# Patient Record
Sex: Female | Born: 1935 | Race: White | Hispanic: No | Marital: Married | Smoking: Former smoker
Health system: Southern US, Community
[De-identification: ages and names within clinical notes are randomized; demographics above are authoritative.]

## PROBLEM LIST (undated history)

## (undated) DIAGNOSIS — A409 Streptococcal sepsis, unspecified: Secondary | ICD-10-CM

## (undated) DIAGNOSIS — M1990 Unspecified osteoarthritis, unspecified site: Secondary | ICD-10-CM

## (undated) DIAGNOSIS — Z8719 Personal history of other diseases of the digestive system: Secondary | ICD-10-CM

## (undated) DIAGNOSIS — M549 Dorsalgia, unspecified: Secondary | ICD-10-CM

## (undated) DIAGNOSIS — R600 Localized edema: Secondary | ICD-10-CM

## (undated) DIAGNOSIS — I10 Essential (primary) hypertension: Secondary | ICD-10-CM

## (undated) DIAGNOSIS — K219 Gastro-esophageal reflux disease without esophagitis: Secondary | ICD-10-CM

## (undated) DIAGNOSIS — E119 Type 2 diabetes mellitus without complications: Secondary | ICD-10-CM

## (undated) DIAGNOSIS — I701 Atherosclerosis of renal artery: Secondary | ICD-10-CM

## (undated) DIAGNOSIS — I4819 Other persistent atrial fibrillation: Secondary | ICD-10-CM

## (undated) DIAGNOSIS — R112 Nausea with vomiting, unspecified: Secondary | ICD-10-CM

## (undated) DIAGNOSIS — G8929 Other chronic pain: Secondary | ICD-10-CM

## (undated) DIAGNOSIS — I739 Peripheral vascular disease, unspecified: Secondary | ICD-10-CM

## (undated) DIAGNOSIS — K5790 Diverticulosis of intestine, part unspecified, without perforation or abscess without bleeding: Secondary | ICD-10-CM

## (undated) DIAGNOSIS — Z9889 Other specified postprocedural states: Secondary | ICD-10-CM

## (undated) DIAGNOSIS — I272 Pulmonary hypertension, unspecified: Secondary | ICD-10-CM

## (undated) DIAGNOSIS — N189 Chronic kidney disease, unspecified: Secondary | ICD-10-CM

## (undated) HISTORY — PX: HERNIA REPAIR: SHX51

## (undated) HISTORY — DX: Diverticulosis of intestine, part unspecified, without perforation or abscess without bleeding: K57.90

## (undated) HISTORY — DX: Type 2 diabetes mellitus without complications: E11.9

## (undated) HISTORY — DX: Dorsalgia, unspecified: M54.9

## (undated) HISTORY — DX: Atherosclerosis of renal artery: I70.1

## (undated) HISTORY — DX: Pulmonary hypertension, unspecified: I27.20

## (undated) HISTORY — PX: BACK SURGERY: SHX140

## (undated) HISTORY — PX: CARDIAC CATHETERIZATION: SHX172

## (undated) HISTORY — DX: Other chronic pain: G89.29

## (undated) HISTORY — DX: Streptococcal sepsis, unspecified: A40.9

## (undated) HISTORY — DX: Localized edema: R60.0

## (undated) HISTORY — DX: Other persistent atrial fibrillation: I48.19

---

## 1999-12-21 ENCOUNTER — Inpatient Hospital Stay (HOSPITAL_COMMUNITY): Admission: EM | Admit: 1999-12-21 | Discharge: 1999-12-27 | Payer: Self-pay | Admitting: *Deleted

## 1999-12-21 ENCOUNTER — Encounter: Payer: Self-pay | Admitting: Internal Medicine

## 1999-12-21 ENCOUNTER — Encounter: Payer: Self-pay | Admitting: Emergency Medicine

## 2002-12-24 ENCOUNTER — Encounter: Admission: RE | Admit: 2002-12-24 | Discharge: 2002-12-24 | Payer: Self-pay | Admitting: Internal Medicine

## 2002-12-24 ENCOUNTER — Encounter: Payer: Self-pay | Admitting: Internal Medicine

## 2009-05-08 ENCOUNTER — Inpatient Hospital Stay (HOSPITAL_COMMUNITY): Admission: EM | Admit: 2009-05-08 | Discharge: 2009-05-12 | Payer: Self-pay | Admitting: Emergency Medicine

## 2009-05-08 ENCOUNTER — Ambulatory Visit: Payer: Self-pay | Admitting: Internal Medicine

## 2009-05-10 ENCOUNTER — Encounter: Payer: Self-pay | Admitting: Internal Medicine

## 2009-05-13 ENCOUNTER — Ambulatory Visit: Payer: Self-pay | Admitting: Cardiology

## 2009-05-13 LAB — CONVERTED CEMR LAB

## 2009-05-18 ENCOUNTER — Ambulatory Visit: Payer: Self-pay | Admitting: Internal Medicine

## 2009-05-18 LAB — CONVERTED CEMR LAB: POC INR: 2

## 2009-05-21 ENCOUNTER — Encounter: Payer: Self-pay | Admitting: Cardiology

## 2009-05-21 ENCOUNTER — Ambulatory Visit: Payer: Self-pay

## 2009-05-21 DIAGNOSIS — I701 Atherosclerosis of renal artery: Secondary | ICD-10-CM | POA: Insufficient documentation

## 2009-05-26 DIAGNOSIS — I4891 Unspecified atrial fibrillation: Secondary | ICD-10-CM | POA: Insufficient documentation

## 2009-05-26 DIAGNOSIS — R0602 Shortness of breath: Secondary | ICD-10-CM | POA: Insufficient documentation

## 2009-05-26 DIAGNOSIS — I10 Essential (primary) hypertension: Secondary | ICD-10-CM | POA: Insufficient documentation

## 2009-05-27 ENCOUNTER — Ambulatory Visit: Payer: Self-pay | Admitting: Cardiology

## 2009-05-27 ENCOUNTER — Ambulatory Visit: Payer: Self-pay | Admitting: Cardiovascular Disease

## 2009-05-27 ENCOUNTER — Encounter: Payer: Self-pay | Admitting: Cardiology

## 2009-05-27 DIAGNOSIS — R072 Precordial pain: Secondary | ICD-10-CM | POA: Insufficient documentation

## 2009-05-27 LAB — CONVERTED CEMR LAB: POC INR: 2

## 2009-06-14 ENCOUNTER — Encounter: Payer: Self-pay | Admitting: Physician Assistant

## 2009-06-14 ENCOUNTER — Encounter: Payer: Self-pay | Admitting: Cardiology

## 2009-06-16 ENCOUNTER — Ambulatory Visit: Payer: Self-pay | Admitting: Cardiology

## 2009-06-23 ENCOUNTER — Ambulatory Visit: Payer: Self-pay | Admitting: Internal Medicine

## 2009-06-23 LAB — CONVERTED CEMR LAB: POC INR: 1.6

## 2009-06-30 ENCOUNTER — Ambulatory Visit: Payer: Self-pay | Admitting: Cardiology

## 2009-07-09 ENCOUNTER — Ambulatory Visit: Payer: Self-pay | Admitting: Cardiology

## 2009-07-09 LAB — CONVERTED CEMR LAB: INR: 3

## 2009-07-20 ENCOUNTER — Encounter (INDEPENDENT_AMBULATORY_CARE_PROVIDER_SITE_OTHER): Payer: Self-pay | Admitting: Cardiology

## 2009-07-20 ENCOUNTER — Ambulatory Visit: Payer: Self-pay | Admitting: Internal Medicine

## 2009-08-02 ENCOUNTER — Ambulatory Visit: Payer: Self-pay | Admitting: Cardiology

## 2009-08-02 ENCOUNTER — Encounter (INDEPENDENT_AMBULATORY_CARE_PROVIDER_SITE_OTHER): Payer: Self-pay | Admitting: *Deleted

## 2009-08-02 LAB — CONVERTED CEMR LAB: POC INR: 2

## 2009-08-06 ENCOUNTER — Ambulatory Visit (HOSPITAL_COMMUNITY): Admission: RE | Admit: 2009-08-06 | Discharge: 2009-08-06 | Payer: Self-pay | Admitting: Cardiology

## 2009-08-13 ENCOUNTER — Ambulatory Visit: Payer: Self-pay | Admitting: Cardiology

## 2009-09-08 ENCOUNTER — Ambulatory Visit: Payer: Self-pay | Admitting: Cardiology

## 2009-09-08 LAB — CONVERTED CEMR LAB: POC INR: 2.3

## 2009-10-06 ENCOUNTER — Ambulatory Visit: Payer: Self-pay | Admitting: Internal Medicine

## 2009-11-03 ENCOUNTER — Ambulatory Visit: Payer: Self-pay | Admitting: Cardiology

## 2009-11-03 LAB — CONVERTED CEMR LAB: POC INR: 1.9

## 2009-12-01 ENCOUNTER — Ambulatory Visit: Payer: Self-pay | Admitting: Cardiovascular Disease

## 2009-12-01 LAB — CONVERTED CEMR LAB: POC INR: 2.1

## 2009-12-10 ENCOUNTER — Ambulatory Visit: Payer: Self-pay | Admitting: Cardiology

## 2009-12-29 ENCOUNTER — Ambulatory Visit: Payer: Self-pay | Admitting: Cardiology

## 2009-12-29 LAB — CONVERTED CEMR LAB: POC INR: 2.1

## 2010-01-04 ENCOUNTER — Ambulatory Visit: Payer: Self-pay | Admitting: Cardiology

## 2010-01-04 DIAGNOSIS — I2510 Atherosclerotic heart disease of native coronary artery without angina pectoris: Secondary | ICD-10-CM | POA: Insufficient documentation

## 2010-01-10 ENCOUNTER — Telehealth: Payer: Self-pay | Admitting: Cardiology

## 2010-01-12 ENCOUNTER — Ambulatory Visit: Payer: Self-pay | Admitting: Cardiology

## 2010-01-21 ENCOUNTER — Telehealth: Payer: Self-pay | Admitting: Cardiology

## 2010-01-24 ENCOUNTER — Ambulatory Visit: Payer: Self-pay | Admitting: Cardiology

## 2010-01-25 LAB — CONVERTED CEMR LAB
Eosinophils Relative: 2.6 % (ref 0.0–5.0)
HCT: 36.8 % (ref 36.0–46.0)
Lymphs Abs: 1.7 10*3/uL (ref 0.7–4.0)
MCV: 89.7 fL (ref 78.0–100.0)
Monocytes Absolute: 0.4 10*3/uL (ref 0.1–1.0)
Monocytes Relative: 5.4 % (ref 3.0–12.0)
Neutro Abs: 5.5 10*3/uL (ref 1.4–7.7)
Neutrophils Relative %: 69.9 % (ref 43.0–77.0)

## 2010-05-06 ENCOUNTER — Ambulatory Visit: Payer: Self-pay | Admitting: Cardiology

## 2010-08-30 NOTE — Assessment & Plan Note (Signed)
Summary: f8m   Visit Type:  Follow-up Primary Provider:  DR Valentina Lucks  CC:  no complaints.  History of Present Illness: Patient is 77 years old and return for management of atrial fibrillation and hypertension. She underwent catheterization for evaluation of shortness of breath several months ago and was found to have nonobstructive CAD. She went into atrial fibrillation at the time of the catheterization. She later converted spontaneously. She had another recurrence a month or 2 later. We have started on flecainide but she developed abdominal pain and we cut the dosage back to 50 mg b.i.d. She continued to have abdominal pain and she stopped this several days ago and also stopped her Coumadin.  Her other main problem is hypertension. We had to cut back her dose of Diovan because of dizziness. She has taken her blood pressures at home and they have been in the range of 109-136/53-67. She was elevated in the office today but she is always elevated when she comes in her car seat correlated well with the blood pressure cuffs and Dr. Jone Baseman office.  Current Medications (verified): 1)  Acetaminophen 325 Mg Tabs (Acetaminophen) .... Take 2 Tablets Every 4 Hours As Needed. 2)  Diovan Hct 80-12.5 Mg Tabs (Valsartan-Hydrochlorothiazide) .... Take One Tab By Mouth Once Daily 3)  Icaps Areds Formula  Tabs (Multiple Vitamins-Minerals) 4)  Bilberry Supplement 5)  Cinnamon 500 Mg Caps (Cinnamon) 6)  Fish Oil  Oil (Fish Oil) 7)  Multivitamins   Tabs (Multiple Vitamin) .... Take One Tablet By Mouth Daily 8)  Warfarin Sodium 5 Mg Tabs (Warfarin Sodium) .... Use As Directed By Anticoagulation Clinic 9)  Flecainide Acetate 100 Mg Tabs (Flecainide Acetate) .... Take 1/2 Tab By Mouth Two Times A Day  Allergies (verified): 1)  ! Penicillin 2)  Codeine 3)  Vicodin  Past History:  Past Medical History: Reviewed history from 05/26/2009 and no changes required. Htn RAS Nl Cor Angio  Review of Systems       ROS is negative except as outlined in HPI.   Vital Signs:  Patient profile:   77 year old female Height:      66 inches Weight:      156 pounds Pulse rate:   82 / minute BP sitting:   172 / 68  (left arm) Cuff size:   large  Vitals Entered By: Laurance Flatten CMA (January 04, 2010 9:25 AM)  Physical Exam  Additional Exam:  Gen. Well-nourished, in no distress   Neck: No JVD, thyroid not enlarged, no carotid bruits Lungs: No tachypnea, clear without rales, rhonchi or wheezes Cardiovascular: Rhythm regular, PMI not displaced,  heart sounds  normal, no murmurs or gallops, no peripheral edema, pulses normal in all 4 extremities. Abdomen: BS normal, abdomen soft and non-tender without masses or organomegaly, no hepatosplenomegaly. MS: No deformities, no cyanosis or clubbing   Neuro:  No focal sns   Skin:  no lesions    Impression & Recommendations:  Problem # 1:  ATRIAL FIBRILLATION (ICD-427.31) She has had paroxysmal atrial fibrillation and had to stop the flecainide due to side effects. She is [place] score 2. She is also on Coumadin. I explained to her that the risk of stroke in her situation is moderate and encouraged her to resume the Coumadin. I think it's unlikely that her side effects of abdominal pain are from the Coumadin. She is agreeable to this and all so would like to consider the possibility of treatment with Pradaxa. We will give her  a prescription for this so she can assess the co-pay and help in the decision about which drug to use. Her updated medication list for this problem includes:    Warfarin Sodium 5 Mg Tabs (Warfarin sodium) ..... Use as directed by anticoagulation clinic    Flecainide Acetate 100 Mg Tabs (Flecainide acetate) .Marland Kitchen... Take 1/2 tab by mouth two times a day  The following medications were removed from the medication list:    Flecainide Acetate 100 Mg Tabs (Flecainide acetate) .Marland Kitchen... Take 1/2 tab by mouth two times a day Her updated medication list for this  problem includes:    Warfarin Sodium 5 Mg Tabs (Warfarin sodium) ..... Use as directed by anticoagulation clinic  Problem # 2:  HYPERTENSION, BENIGN (ICD-401.1) Her blood pressure appears very well controlled on her home readings. I think we'll have to settle for this even though her pressures are elevated in the office. She gets uncomfortable side effects only titrate her medications. We will continue current therapy. Her updated medication list for this problem includes:    Diovan Hct 80-12.5 Mg Tabs (Valsartan-hydrochlorothiazide) .Marland Kitchen... Take one tab by mouth once daily  Problem # 3:  CAD, NATIVE VESSEL (ICD-414.01) She had nonobstructive coronary disease at catheterization. She has no symptoms of this problem is stable. Her updated medication list for this problem includes:    Warfarin Sodium 5 Mg Tabs (Warfarin sodium) ..... Use as directed by anticoagulation clinic  Patient Instructions: 1)  Your physician has recommended you make the following change in your medication: 1) STOP flecainide, 2) re-start coumadin at your current dose.  2)  Your physician wants you to follow-up in: 4 months.  You will receive a reminder letter in the mail two months in advance. If you don't receive a letter, please call our office to schedule the follow-up appointment. 3)  You will need to see the coumadin clinic in 1 week to recheck your level since you have been off coumadin. 4)  You have been given a prescription for Pradaxa 150mg  two times a day so you may price check this at your pharmacy. 5)  You may learn more about this drug at [email or web address].tn. Prescriptions: PRADAXA 150 MG CAPS (DABIGATRAN ETEXILATE MESYLATE) take one tab by mouth two times a day  #60 x 6   Entered by:   Sherri Rad, RN, BSN   Authorized by:   Lenoria Farrier, MD, [facility]   Signed by:   Sherri Rad, RN, BSN on 01/04/2010   Method used:   Print then Give to Patient   RxID:   [id number]

## 2010-08-30 NOTE — Medication Information (Signed)
Summary: rov.mp  Anticoagulant Therapy  Managed by: Cloyde Reams, RN, BSN Referring MD: Juanda Chance MD, Bruce PCP: DR Bryson Corona MD: Riley Kill MD, Maisie Fus Indication 1: Atrial Fibrillation (427.31) Lab Used: [facility] [place] Site: [address] INR POC 2.0 INR RANGE 2-3  Dietary changes: no    Health status changes: no    Bleeding/hemorrhagic complications: no    Recent/future hospitalizations: no    Any changes in medication regimen? no    Recent/future dental: no  Any missed doses?: no       Is patient compliant with meds? yes       Allergies: 1)  ! Penicillin 2)  Codeine 3)  Vicodin  Anticoagulation Management History:      The patient is taking warfarin and comes in today for a routine follow up visit.  Positive risk factors for bleeding include an age of 52 years or older.  The bleeding index is 'intermediate risk'.  Positive CHADS2 values include History of HTN and Age > 88 years old.  Her last INR was 3.0.  Anticoagulation responsible provider: Riley Kill MD, Maisie Fus.  INR POC: 2.0.  Cuvette Lot#: [id number].  Exp: 08/2010.    Anticoagulation Management Assessment/Plan:      The patient's current anticoagulation dose is Warfarin sodium 5 mg tabs: Use as directed by Anticoagulation Clinic.  The target INR is 2.0-3.0.  The next INR is due 08/13/2009.  Anticoagulation instructions were given to patient.  Results were reviewed/authorized by Cloyde Reams, RN, BSN.  She was notified by Cloyde Reams RN.         Prior Anticoagulation Instructions: INR 3.1  Skip 1 day then take 1 tab each Monday and Friday and 0.5 tab on all other days.    Recheck in 3 weeks.   Current Anticoagulation Instructions: INR 2.0  Take 1.5 tablets today then resume previous dosage 1/2 tablet daily except 1 tablet on Mondays, Wednesdays, and Fridays.  Pt scheduled for DCCV on 08/06/09.  Recheck in 1 week.

## 2010-08-30 NOTE — Medication Information (Signed)
Summary: rov/tm  Anticoagulant Therapy  Managed by: Cloyde Reams, RN, BSN Referring MD: Juanda Chance MD, Bruce PCP: DR Bryson Corona MD: Jens Som MD, Arlys John Indication 1: Atrial Fibrillation (427.31) Lab Used: [facility] [place] Site: [address] INR POC 2.3 INR RANGE 2-3  Dietary changes: no    Health status changes: no    Bleeding/hemorrhagic complications: no    Recent/future hospitalizations: no    Any changes in medication regimen? no    Recent/future dental: no  Any missed doses?: no       Is patient compliant with meds? yes       Allergies: 1)  ! Penicillin 2)  Codeine 3)  Vicodin  Anticoagulation Management History:      The patient is taking warfarin and comes in today for a routine follow up visit.  Positive risk factors for bleeding include an age of 17 years or older.  The bleeding index is 'intermediate risk'.  Positive CHADS2 values include History of HTN and Age > 66 years old.  Her last INR was 3.0.  Anticoagulation responsible provider: Jens Som MD, Arlys John.  INR POC: 2.3.  Cuvette Lot#: [id number].  Exp: 10/2010.    Anticoagulation Management Assessment/Plan:      The patient's current anticoagulation dose is Warfarin sodium 5 mg tabs: Use as directed by Anticoagulation Clinic.  The target INR is 2.0-3.0.  The next INR is due 10/06/2009.  Anticoagulation instructions were given to patient.  Results were reviewed/authorized by Cloyde Reams, RN, BSN.  She was notified by Cloyde Reams RN.         Prior Anticoagulation Instructions: INR 2.4 Continue 2.5mg s daily except 5mg s on Mondays, Wednesdays and Fridays.  Recheck on 09/08/09 with Dr. Juanda Chance appt.   Current Anticoagulation Instructions: INR 2.3  Continue on same dosage 1/2 tablet daily except 1 tablet on Mondays, Wednesdays, and Fridays.  Recheck in 4 weeks.

## 2010-08-30 NOTE — Progress Notes (Signed)
Summary: changing coumadin  Phone Note Call from Patient Call back at Home Phone [phone]   Caller: Patient Reason for Call: Talk to Nurse, Talk to Doctor Summary of Call: pt is gonna come off coumadin and try the prodaxa Initial call taken by: Omer Jack,  January 10, 2010 9:21 AM  Follow-up for Phone Call        01/10/10--pt wants to come off coumadin and start pradaxa--she already has a RX at home and was told to notify us when she wanted to start--will discuss with sally nutt (pharmacist) and get med started--nt 01/10/10--after speaking to cvrr called pt and asked her to call cvrr when she had pradaxa in hand, she will then go off coumadin x 2 days and if inr below 2 on third day, she may start pradaxa--cvrr will handle this--nt Follow-up by: Ledon Snare, RN,  January 10, 2010 9:57 AM

## 2010-08-30 NOTE — Assessment & Plan Note (Signed)
Summary: rov per pt   Visit Type:  Follow-up Primary Provider:  DR Valentina Lucks  CC:  no complaints.  History of Present Illness: The patient is 77 years old and return for management of atrial fibrillation. We evaluated her for chest pain and she underwent catheterization and was found to have nonobstructive coronary disease. She developed a 2 fibrillation of the laboratory and subsequently had atrial fibrillation paroxysmally. 2 start on flexion I think Coumadin.  She did well initially but about a week and a half ago she developed crampy abdominal pain after eating and diarrhea. She also had some nausea. She wondered if this was related to her medicines and specifically wondered about flecainide and Coumadin. Further problem include 70% renal artery stenosis. She also has hypertension.  She also said that her blood pressures have been running low at home and she brought in readings. They ranged from 98 systolic to 130 systolic. She says she feels weak and dizzy when her blood pressure gets low. Her readings have all been much lower at home and they are in the office and she has correlated her blood pressure cuff with her primary care physician's pressure cuff.  Current Medications (verified): 1)  Acetaminophen 325 Mg Tabs (Acetaminophen) .... Take 2 Tablets Every 4 Hours As Needed. 2)  Diovan Hct 160-25 Mg Tabs (Valsartan-Hydrochlorothiazide) .... Take 1 Tablet Daily 3)  Icaps Areds Formula  Tabs (Multiple Vitamins-Minerals) 4)  Bilberry Supplement 5)  Cinnamon 500 Mg Caps (Cinnamon) 6)  Fish Oil  Oil (Fish Oil) 7)  Multivitamins   Tabs (Multiple Vitamin) .... Take One Tablet By Mouth Daily 8)  Warfarin Sodium 5 Mg Tabs (Warfarin Sodium) .... Use As Directed By Anticoagulation Clinic 9)  Flecainide Acetate 100 Mg Tabs (Flecainide Acetate) .... Take One Tablet By Mouth Every 12 Hours  Allergies (verified): 1)  ! Penicillin 2)  Codeine 3)  Vicodin  Past History:  Past Medical  History: Reviewed history from 05/26/2009 and no changes required. Htn RAS Nl Cor Angio  Review of Systems       ROS is negative except as outlined in HPI.   Vital Signs:  Patient profile:   77 year old female Height:      66 inches Weight:      160 pounds Pulse rate:   64 / minute BP sitting:   180 / 70 Cuff size:   large  Vitals Entered By: Burnett Kanaris, CNA (Dec 10, 2009 1:57 PM)  Physical Exam  Additional Exam:  Gen. Well-nourished, in no distress   Neck: No JVD, thyroid not enlarged, no carotid bruits Lungs: No tachypnea, clear without rales, rhonchi or wheezes Cardiovascular: Rhythm regular, PMI not displaced,  heart sounds  normal, no murmurs or gallops, no peripheral edema, pulses normal in all 4 extremities. Abdomen: BS normal, abdomen soft and non-tender without masses or organomegaly, no hepatosplenomegaly. MS: No deformities, no cyanosis or clubbing   Neuro:  No focal sns   Skin:  no lesions    Impression & Recommendations:  Problem # 1:  ATRIAL FIBRILLATION (ICD-427.31)  She has had no recurrences of atrial fibrillation on flecainide, but she is having some potential side effects from flecainide. She has had crampy abdominal pain and nausea and diarrhea. side effects for flecainide include abdominal pain and nausea and constipation. We will decrease her flecainide to 50 mg b.i.d. If she is not better then we'll stop it altogether. If she continues to have symptoms I suggested that she see her primary  care physician for further evaluation. If we have to come off of flecainide and we'll consider another antiarrhythmic, possibly propafenone. Her updated medication list for this problem includes:    Warfarin Sodium 5 Mg Tabs (Warfarin sodium) ..... Use as directed by anticoagulation clinic    Flecainide Acetate 100 Mg Tabs (Flecainide acetate) .Marland Kitchen... Take 1/2 tab by mouth two times a day  Her updated medication list for this problem includes:    Warfarin Sodium 5  Mg Tabs (Warfarin sodium) ..... Use as directed by anticoagulation clinic    Flecainide Acetate 100 Mg Tabs (Flecainide acetate) .Marland Kitchen... Take 1/2 tab by mouth two times a day  Orders: EKG w/ Interpretation (93000)  Problem # 2:  HYPERTENSION, BENIGN (ICD-401.1) Her blood pressures have been well controlled at home and had been on the low side and she has had symptoms of dizziness and weakness possibly related to low blood pressure. We will cut her dose of an hydrochlorothiazide in half. Her updated medication list for this problem includes:    Diovan Hct 80-12.5 Mg Tabs (Valsartan-hydrochlorothiazide) .Marland Kitchen... Take one tab by mouth once daily  Patient Instructions: 1)  Your physician recommends that you schedule a follow-up appointment in: as scheduled on 01/04/10 @ 9:15am 2)  Your physician has recommended you make the following change in your medication: 1) decrease flecainide to 100mg  1/2 tab by mouth two times a day , 2) decrease diovan to 160/25 1/2 tab once daily ( you are being given a new prescription for diovan 80/12.5mg ). Prescriptions: DIOVAN HCT 80-12.5 MG TABS (VALSARTAN-HYDROCHLOROTHIAZIDE) take one tab by mouth once daily  #30 x 6   Entered by:   Sherri Rad, RN, BSN   Authorized by:   Lenoria Farrier, MD, [facility]   Signed by:   Sherri Rad, RN, BSN on 12/10/2009   Method used:   Print then Give to Patient   RxID:   [id number]

## 2010-08-30 NOTE — Assessment & Plan Note (Signed)
Summary: 1 MONTH ROV   Visit Type:  Follow-up Primary Provider:  DR Valentina Lucks  CC:  no energy-pt continues to have edema.  History of Present Illness: The patient is 77 years old and return for management of atrial fibrillation. She was hospitalized in October with chest pain and had normal coronary angiography. She went into atrial fibrillation during that hospitalization.  she reverted to sinus rhythm and was discharged.  She developed recurrent atrial fibrillation and was seen by Lonn Georgia on June 16, 2009. Her Cardizem was increased and she was put back on Coumadin. She has had persistent symptoms of palpitations and fatigue. She has also had lower extremity edema which we relate to the Cardizem.  Her INRs have been therapeutic with an INR of 3.0 on December 10 and an INR of 3.1 on December 31.    Current Medications (verified): 1)  Diltiazem Hcl Er Beads 240 Mg Xr24h-Cap (Diltiazem Hcl Er Beads) .... Take One Capsule By Mouth Daily 2)  Aspirin 81 Mg Tbec (Aspirin) .... Take One Tablet By Mouth Daily 3)  Acetaminophen 325 Mg Tabs (Acetaminophen) .... Take 2 Tablets Every 4 Hours As Needed. 4)  Diovan Hct 160-25 Mg Tabs (Valsartan-Hydrochlorothiazide) .... Take 1 Tablet Daily 5)  Icaps Areds Formula  Tabs (Multiple Vitamins-Minerals) 6)  Bilberry Supplement 7)  Cinnamon 500 Mg Caps (Cinnamon) 8)  Fish Oil  Oil (Fish Oil) 9)  Multivitamins   Tabs (Multiple Vitamin) .... Take One Tablet By Mouth Daily 10)  Warfarin Sodium 5 Mg Tabs (Warfarin Sodium) .... Use As Directed By Anticoagulation Clinic  Allergies (verified): 1)  ! Penicillin 2)  Codeine 3)  Vicodin  Past History:  Past Medical History: Reviewed history from 05/26/2009 and no changes required. Htn RAS Nl Cor Angio  Review of Systems       ROS is negative except as outlined in HPI.   Vital Signs:  Patient profile:   77 year old female Height:      66 inches Weight:      172 pounds BMI:      27.86 Pulse rate:   62 / minute BP sitting:   147 / 82  (left arm) Cuff size:   large  Physical Exam  Additional Exam:  Gen. Well-nourished, in no distress   Neck: No JVD, thyroid not enlarged, no carotid bruits Lungs: No tachypnea, clear without rales, rhonchi or wheezes Cardiovascular: Rhythm irregular, PMI not displaced,  heart sounds  normal, no murmurs or gallops, 2+ right and 1+ left peripheral edema, pulses normal in all 4 extremities. Abdomen: BS normal, abdomen soft and non-tender without masses or organomegaly, no hepatosplenomegaly. MS: No deformities, no cyanosis or clubbing   Neuro:  No focal sns   Skin:  no lesions    Impression & Recommendations:  Problem # 1:  ATRIAL FIBRILLATION (ICD-427.31)  She has recurrent atrial fibrillation which has been persistent. She is symptomatic with palpitations and fatigue. I think we should try and get her back in sinus rhythm. We will start Tambocor 100 mg b.i.d. tomorrow and plan DC cardioversion on Friday. Because of lower extremity edema we will DC the Cardizem CD 240 and replace it with Lopressor 50 mg b.i.d. We will plan a followup stress test with the Tambocor because she is unable to walk on a treadmill due to back problems.  Her INR was 2.0 today. We'll increase her Coumadin dose and repeat her INR on Friday when she comes in for her cardioversion.  Ae and  her family may be interested in per Pradaxa in the future for long-term anticoagulation. Her updated medication list for this problem includes:    Metoprolol Tartrate 50 Mg Tabs (Metoprolol tartrate) .Marland Kitchen... Take one tablet by mouth twice a day    Aspirin 81 Mg Tbec (Aspirin) .Marland Kitchen... Take one tablet by mouth daily    Warfarin Sodium 5 Mg Tabs (Warfarin sodium) ..... Use as directed by anticoagulation clinic    Flecainide Acetate 100 Mg Tabs (Flecainide acetate) .Marland Kitchen... Take one tablet by mouth every 12 hours  Orders: EKG w/ Interpretation (93000) Cardioversion  (Cardioversion)  Problem # 2:  HYPERTENSION, BENIGN (ICD-401.1) Her blood pressure is controlled on current medications. The following medications were removed from the medication list:    Diltiazem Hcl Er Beads 120 Mg Xr24h-cap (Diltiazem hcl er beads) .Marland Kitchen... Take one capsule by mouth daily in the evening Her updated medication list for this problem includes:    Metoprolol Tartrate 50 Mg Tabs (Metoprolol tartrate) .Marland Kitchen... Take one tablet by mouth twice a day    Aspirin 81 Mg Tbec (Aspirin) .Marland Kitchen... Take one tablet by mouth daily    Diovan Hct 160-25 Mg Tabs (Valsartan-hydrochlorothiazide) .Marland Kitchen... Take 1 tablet daily  The following medications were removed from the medication list:    Diltiazem Hcl Er Beads 120 Mg Xr24h-cap (Diltiazem hcl er beads) .Marland Kitchen... Take one capsule by mouth daily in the evening Her updated medication list for this problem includes:    Diltiazem Hcl Er Beads 240 Mg Xr24h-cap (Diltiazem hcl er beads) .Marland Kitchen... Take one capsule by mouth daily    Aspirin 81 Mg Tbec (Aspirin) .Marland Kitchen... Take one tablet by mouth daily    Diovan Hct 160-25 Mg Tabs (Valsartan-hydrochlorothiazide) .Marland Kitchen... Take 1 tablet daily  Patient Instructions: 1)  Your physician recommends that you schedule a follow-up appointment in: 1) next week with the PA 2) 4 weeks with Dr. Juanda Chance. 2)  Your physician has recommended you make the following change in your medication: 1) STOP cardizem (diltiazem), 2) START lopressor (metoprolol) 50mg  two times a day- on 08/03/09. 3) START flecainide 100mg  two times a day - on 08/03/09. 3)  Your physician has recommended that you have a cardioversion (DCCV).  Electrical cardioversion uses a jolt of electricity to your heart either through paddles or wired patches attached to your chest. This is a controlled, usually prescheduled, procedure. Defibrillation is done under light anesthesia in the hospital, and you usually go home the day of the procedure. This is done to get your heart back into a  normal rhythm. You are not awake for the procedure. Please see the instruction sheet given to you today.  Appended Document: 1 MONTH ROV Pt in NSR 1/7.  Had reaction to either lopressor or flecainide and stopped both and restarted cardizem.  She is agreeable to trying flecainide again and will keep on cardizem for now.  Has f/u with Wynell Balloon next week. BB

## 2010-08-30 NOTE — Letter (Signed)
Summary: Cardioversion/TEE Instructions  Architectural technologist, Main Office  [address]   [place], [place] [address]   Phone: [phone]  Fax: [phone]    Cardioversion / TEE Cardioversion Instructions  You are scheduled for a Cardioversion on Friday 08/06/09 with Dr.Brodie.   Please arrive at the [facility] at 10:30 a.m.  on the day of your procedure.  1)   DIET:  A)   Nothing to eat or drink after midnight except your medications with a sip of water.  B)   May have clear liquid breakfast, then nothing to eat or drink after _________ a.m. / p.m.      Clear liquids include:  water, broth, Sprite, Ginger Ale, black coffee, tea (no sugar),      cranberry / grape / apple juice, jello (not red), popsicle from clear juices (not red).  2)   Come to the [place] office on _____N/A______ for lab work. The lab at [facility] is open from 8:30 a.m. to 1:30 p.m. and 2:30 p.m. to 5:00 p.m. The lab at [facility] is open from 7:30 a.m. to 5:30 p.m. You do not have to be fasting.  3)   MAKE SURE YOU TAKE YOUR COUMADIN.  4)   A)   DO NOT TAKE these medications before your procedure:      ___________________________________________________________________     ___________________________________________________________________     ___________________________________________________________________  B)   YOU MAY TAKE ALL of your remaining medications with a small amount of water.    C)   START NEW medications:       ___________________________________________________________________     ___________________________________________________________________  5)  Must have a responsible person to drive you home.  6)   Bring a current list of your medications and current insurance cards.   * Special Note:  Every effort is made to have your procedure done on time. Occasionally there are emergencies that present themselves at the  hospital that may cause delays. Please be patient if a delay does occur.  * If you have any questions after you get home, please call the office at [phone].

## 2010-08-30 NOTE — Progress Notes (Signed)
Summary: lab appt   ---- Converted from flag ---- ---- 01/12/2010 8:48 AM, Bethena Midget, RN, BSN wrote: Pt starting Pradaxa today (01/12/10) please order CBC for 7-10 days out. Please call pt and give her lab appt. ------------------------------  Phone Note Outgoing Call   Call placed by: Sherri Rad, RN, BSN,  January 21, 2010 11:43 AM Call placed to: Patient Summary of Call: I left a message for the pt to call to set up her lab appt. Sherri Rad, RN, BSN  January 21, 2010 11:43 AM  I spoke with the pt. She will come on 6/27 for a CBC.  Initial call taken by: Sherri Rad, RN, BSN,  January 21, 2010 12:56 PM

## 2010-08-30 NOTE — Medication Information (Signed)
Summary: rov/cb  Anticoagulant Therapy  Managed by: Eda Keys, PharmD Referring MD: Juanda Chance MD, Bruce PCP: DR Bryson Corona MD: Eden Emms MD, Theron Arista Indication 1: Atrial Fibrillation (427.31) Lab Used: [facility] [place] Site: [address] INR POC 2.1 INR RANGE 2-3  Dietary changes: no    Health status changes: yes       Details: Pt has experienced frequent diarrhea after almost every breakfast.  She reports 5+stools after breakfast. They are lighter in color than normal, and become more watery.  Bleeding/hemorrhagic complications: no    Recent/future hospitalizations: no    Any changes in medication regimen? no    Recent/future dental: no  Any missed doses?: no       Is patient compliant with meds? yes       Current Medications (verified): 1)  Acetaminophen 325 Mg Tabs (Acetaminophen) .... Take 2 Tablets Every 4 Hours As Needed. 2)  Diovan Hct 160-25 Mg Tabs (Valsartan-Hydrochlorothiazide) .... Take 1 Tablet Daily 3)  Icaps Areds Formula  Tabs (Multiple Vitamins-Minerals) 4)  Bilberry Supplement 5)  Cinnamon 500 Mg Caps (Cinnamon) 6)  Fish Oil  Oil (Fish Oil) 7)  Multivitamins   Tabs (Multiple Vitamin) .... Take One Tablet By Mouth Daily 8)  Warfarin Sodium 5 Mg Tabs (Warfarin Sodium) .... Use As Directed By Anticoagulation Clinic 9)  Flecainide Acetate 100 Mg Tabs (Flecainide Acetate) .... Take One Tablet By Mouth Every 12 Hours  Allergies (verified): 1)  ! Penicillin 2)  Codeine 3)  Vicodin  Anticoagulation Management History:      The patient is taking warfarin and comes in today for a routine follow up visit.  Positive risk factors for bleeding include an age of 75 years or older.  The bleeding index is 'intermediate risk'.  Positive CHADS2 values include History of HTN and Age > 75 years old.  Her last INR was 3.0.  Anticoagulation responsible provider: Eden Emms MD, Theron Arista.  INR POC: 2.1.  Cuvette Lot#: [id number].  Exp: 12/2010.     Anticoagulation Management Assessment/Plan:      The patient's current anticoagulation dose is Warfarin sodium 5 mg tabs: Use as directed by Anticoagulation Clinic.  The target INR is 2.0-3.0.  The next INR is due 12/29/2009.  Anticoagulation instructions were given to patient.  Results were reviewed/authorized by Eda Keys, PharmD.  She was notified by Eda Keys.         Prior Anticoagulation Instructions: INR 1.9. Take 1.5 tablets today, then take 0.5 tablet daily except 1 tablet on Mon, Wed, Fri.  Current Anticoagulation Instructions: INR 2.1  Continue taking 1 tablet on Monday, Wednesday, and Friday and 1/2 tablet all other days.  Return to clinic in 4 weeks.

## 2010-08-30 NOTE — Assessment & Plan Note (Signed)
Summary: per check out   Primary Provider:  DR Valentina Lucks   History of Present Illness: Jasmine Jenkins is 77 years old and return for management of atrial fibrillation. We evaluated her for shortness of breath and gentamicin catheterization and was found to have nonobstructive CAD. She had atrial fibrillation after her catheterization and then she had recurrence 2 months later. She was treated with Flecainide and Coumadin but was unable to tolerate either.  When I saw her last time in followup we discussed treatment with Coumadin again since she is [place] score 2 and [place] Vascor 4 and she agreed to try Pradaxa. She doesn't feel like she can tolerate this. She says she developed pain in her lower extremities and swelling. She also didn't feel like she could tolerate Coumadin.  She's had no recurrent palpitations and has had no chest pain.  Current Medications (verified): 1)  Acetaminophen 325 Mg Tabs (Acetaminophen) .... Take 2 Tablets Every 4 Hours As Needed. 2)  Diovan Hct 80-12.5 Mg Tabs (Valsartan-Hydrochlorothiazide) .... Take One Tab By Mouth Once Daily 3)  Icaps Areds Formula  Tabs (Multiple Vitamins-Minerals) 4)  Bilberry Supplement 5)  Cinnamon 500 Mg Caps (Cinnamon) 6)  Fish Oil  Oil (Fish Oil) 7)  Multivitamins   Tabs (Multiple Vitamin) .... Take One Tablet By Mouth Daily 8)  Pradaxa 150 Mg Caps (Dabigatran Etexilate Mesylate) .... Take One Tab By Mouth Two Times A Day  Allergies (verified): 1)  ! Penicillin 2)  Codeine 3)  Vicodin  Past History:  Past Medical History: Reviewed history from 05/26/2009 and no changes required. Htn RAS Nl Cor Angio  Review of Systems       ROS is negative except as outlined in HPI.   Vital Signs:  Patient profile:   77 year old female Height:      66 inches Weight:      154 pounds BMI:     24.95 Pulse rate:   66 / minute Resp:     16 per minute BP sitting:   178 / 80  (left arm) Cuff size:   large  Vitals Entered By: Sherri Rad,  RN, BSN (May 06, 2010 8:44 AM)  Physical Exam  Additional Exam:  Gen. Well-nourished, in no distress   Neck: No JVD, thyroid not enlarged, no carotid bruits Lungs: No tachypnea, clear without rales, rhonchi or wheezes Cardiovascular: Rhythm regular, PMI not displaced,  heart sounds  normal, no murmurs or gallops, no peripheral edema, pulses normal in all 4 extremities. Abdomen: BS normal, abdomen soft and non-tender without masses or organomegaly, no hepatosplenomegaly. MS: No deformities, no cyanosis or clubbing   Neuro:  No focal sns   Skin:  no lesions    Impression & Recommendations:  Problem # 1:  ATRIAL FIBRILLATION (ICD-427.31) She is currently holding sinus rhythm on no rate control or rhythm control medications. She does not think she can tolerate either per DAX or Coumadin. I think we'll have to do the best we can with aspirin. I have explained to her that her stroke risk was considerable since she has relatively high chance course as outlined in the history of present illness.  Since she and her husband that most of her care through [facility] she would like to see Dr. Eldridge Dace followup with my retirement at the end of the year.  The following medications were removed from the medication list:    Warfarin Sodium 5 Mg Tabs (Warfarin sodium) ..... Use as directed by anticoagulation clinic Her  updated medication list for this problem includes:    Aspirin 81 Mg Tbec (Aspirin) .Marland Kitchen... Take one tablet by mouth daily  Problem # 2:  HYPERTENSION, BENIGN (ICD-401.1)  Her updated medication list for this problem includes:    Diovan Hct 80-12.5 Mg Tabs (Valsartan-hydrochlorothiazide) .Marland Kitchen... Take one tab by mouth once daily    Aspirin 81 Mg Tbec (Aspirin) .Marland Kitchen... Take one tablet by mouth daily    Hydrochlorothiazide 12.5 Mg Tabs (Hydrochlorothiazide) .Marland Kitchen... Take one tablet by mouth daily as needed  Problem # 3:  CAD, NATIVE VESSEL (ICD-414.01) She has nonobstructive CAD and should be on  secondary prevention but she has been intolerant to statins and most drugs. The following medications were removed from the medication list:    Warfarin Sodium 5 Mg Tabs (Warfarin sodium) ..... Use as directed by anticoagulation clinic Her updated medication list for this problem includes:    Aspirin 81 Mg Tbec (Aspirin) .Marland Kitchen... Take one tablet by mouth daily  Patient Instructions: 1)  Stop Pradaxa. 2)  Start Aspirin 81mg  once daily. 3)  Start Hydrochlorothiazide 12.5mg  once daily as needed. 4)  We will see you on an as needed basis. Prescriptions: HYDROCHLOROTHIAZIDE 12.5 MG TABS (HYDROCHLOROTHIAZIDE) Take one tablet by mouth daily as needed  #30 x 2   Entered by:   Sherri Rad, RN, BSN   Authorized by:   Lenoria Farrier, MD, [facility]   Signed by:   Sherri Rad, RN, BSN on 05/06/2010   Method used:   Print then Give to Patient   RxID:   [id number]

## 2010-08-30 NOTE — Medication Information (Signed)
Summary: rov/eac  Anticoagulant Therapy  Managed by: Bethena Midget, RN, BSN Referring MD: Juanda Chance MD, Bruce PCP: DR Bryson Corona MD: Myrtis Ser MD, Tinnie Gens Indication 1: Atrial Fibrillation (427.31) Lab Used: [facility] [place] Site: [address] INR POC 2.1 INR RANGE 2-3  Dietary changes: no    Health status changes: no    Bleeding/hemorrhagic complications: no    Recent/future hospitalizations: no    Any changes in medication regimen? no    Recent/future dental: no  Any missed doses?: no       Is patient compliant with meds? yes       Allergies: 1)  ! Penicillin 2)  Codeine 3)  Vicodin  Anticoagulation Management History:      The patient is taking warfarin and comes in today for a routine follow up visit.  Positive risk factors for bleeding include an age of 75 years or older.  The bleeding index is 'intermediate risk'.  Positive CHADS2 values include History of HTN and Age > 19 years old.  Her last INR was 3.0.  Anticoagulation responsible provider: Myrtis Ser MD, Tinnie Gens.  INR POC: 2.1.  Cuvette Lot#: [id number].  Exp: 03/2011.    Anticoagulation Management Assessment/Plan:      The patient's current anticoagulation dose is Warfarin sodium 5 mg tabs: Use as directed by Anticoagulation Clinic.  The target INR is 2.0-3.0.  The next INR is due 01/26/2010.  Anticoagulation instructions were given to patient.  Results were reviewed/authorized by Bethena Midget, RN, BSN.  She was notified by Bethena Midget, RN, BSN.         Prior Anticoagulation Instructions: INR 2.1  Continue taking 1 tablet on Monday, Wednesday, and Friday and 1/2 tablet all other days.  Return to clinic in 4 weeks.    Current Anticoagulation Instructions: INR 2.1 Continue 2.5mg s everyday except 5mg s on Mondays, Wednesdays and Fridays. Recheck in 4 weeks.

## 2010-08-30 NOTE — Medication Information (Signed)
Summary: rov.mp  Anticoagulant Therapy  Managed by: Bethena Midget, RN, BSN Referring MD: Juanda Chance MD, Bruce PCP: DR Bryson Corona MD: Jens Som MD, Arlys John Indication 1: Atrial Fibrillation (427.31) Lab Used: [facility] [place] Site: [address] INR POC 2.4 INR RANGE 2-3  Dietary changes: no    Health status changes: no    Bleeding/hemorrhagic complications: no    Recent/future hospitalizations: no    Any changes in medication regimen? no    Recent/future dental: no  Any missed doses?: no       Is patient compliant with meds? yes       Allergies: 1)  ! Penicillin 2)  Codeine 3)  Vicodin  Anticoagulation Management History:      The patient comes in today for her initial visit for anticoagulation therapy.  Positive risk factors for bleeding include an age of 74 years or older.  The bleeding index is 'intermediate risk'.  Positive CHADS2 values include History of HTN and Age > 34 years old.  Her last INR was 3.0.  Anticoagulation responsible provider: Jens Som MD, Arlys John.  INR POC: 2.4.  Cuvette Lot#: [id number].  Exp: 10/2010.    Anticoagulation Management Assessment/Plan:      The patient's current anticoagulation dose is Warfarin sodium 5 mg tabs: Use as directed by Anticoagulation Clinic.  The target INR is 2.0-3.0.  The next INR is due 09/08/2009.  Anticoagulation instructions were given to patient.  Results were reviewed/authorized by Bethena Midget, RN, BSN.  She was notified by Bethena Midget, RN, BSN.         Prior Anticoagulation Instructions: INR 2.0  Take 1.5 tablets today then resume previous dosage 1/2 tablet daily except 1 tablet on Mondays, Wednesdays, and Fridays.  Pt scheduled for DCCV on 08/06/09.  Recheck in 1 week.  Current Anticoagulation Instructions: INR 2.4 Continue 2.5mg s daily except 5mg s on Mondays, Wednesdays and Fridays.  Recheck on 09/08/09 with Dr. Juanda Chance appt.

## 2010-08-30 NOTE — Medication Information (Signed)
Summary: rov/tm  Anticoagulant Therapy  Managed by: Inactive Referring MD: Juanda Chance MD, Bruce PCP: DR Bryson Corona MD: Myrtis Ser MD, Tinnie Gens Indication 1: Atrial Fibrillation (427.31) Lab Used: [facility] [place] Site: [address] INR POC 1.1 INR RANGE 2-3  Dietary changes: no    Health status changes: no    Bleeding/hemorrhagic complications: no    Recent/future hospitalizations: no    Any changes in medication regimen? no    Recent/future dental: no  Any missed doses?: yes     Details: Stopped coumadin 2 days ago.   Is patient compliant with meds? yes      Comments: Starting Pradaxa  Allergies: 1)  ! Penicillin 2)  Codeine 3)  Vicodin  Anticoagulation Management History:      The patient is taking warfarin and comes in today for a routine follow up visit.  Positive risk factors for bleeding include an age of 75 years or older.  The bleeding index is 'intermediate risk'.  Positive CHADS2 values include History of HTN and Age > 75 years old.  Her last INR was 3.0.  Anticoagulation responsible provider: Myrtis Ser MD, Tinnie Gens.  INR POC: 1.1.  Cuvette Lot#: [id number].  Exp: 03/2011.    Anticoagulation Management Assessment/Plan:      The patient's current anticoagulation dose is Warfarin sodium 5 mg tabs: Use as directed by Anticoagulation Clinic.  The target INR is 2.0-3.0.  The next INR is due 01/26/2010.  Anticoagulation instructions were given to patient.  Results were reviewed/authorized by Inactive.  She was notified by Bethena Midget, RN, BSN.         Prior Anticoagulation Instructions: INR 2.1 Continue 2.5mg s everyday except 5mg s on Mondays, Wednesdays and Fridays. Recheck in 4 weeks.   Current Anticoagulation Instructions: INR 1.1 Start Pradaxa 150mg s Twice aday, every 12hours.

## 2010-08-30 NOTE — Medication Information (Signed)
Summary: rov/tm  Anticoagulant Therapy  Managed by: Elaina Pattee, PharmD Referring MD: Juanda Chance MD, Bruce PCP: DR Bryson Corona MD: Jens Som MD, Arlys John Indication 1: Atrial Fibrillation (427.31) Lab Used: [facility] [place] Site: [address] INR POC 1.9 INR RANGE 2-3  Dietary changes: no    Health status changes: no    Bleeding/hemorrhagic complications: no    Recent/future hospitalizations: no    Any changes in medication regimen? no    Recent/future dental: no  Any missed doses?: no       Is patient compliant with meds? yes       Allergies: 1)  ! Penicillin 2)  Codeine 3)  Vicodin  Anticoagulation Management History:      The patient is taking warfarin and comes in today for a routine follow up visit.  Positive risk factors for bleeding include an age of 75 years or older.  The bleeding index is 'intermediate risk'.  Positive CHADS2 values include History of HTN and Age > 61 years old.  Her last INR was 3.0.  Anticoagulation responsible provider: Jens Som MD, Arlys John.  INR POC: 1.9.  Cuvette Lot#: [id number].  Exp: 11/2010.    Anticoagulation Management Assessment/Plan:      The patient's current anticoagulation dose is Warfarin sodium 5 mg tabs: Use as directed by Anticoagulation Clinic.  The target INR is 2.0-3.0.  The next INR is due 12/01/2009.  Anticoagulation instructions were given to patient.  Results were reviewed/authorized by Elaina Pattee, PharmD.  She was notified by Elaina Pattee, PharmD.         Prior Anticoagulation Instructions: INR 2.0 Continue 2.5mg s dailly except 5mg s on Mondays, Wednesdays and Fridays. Recheck in 4 weeks.   Current Anticoagulation Instructions: INR 1.9. Take 1.5 tablets today, then take 0.5 tablet daily except 1 tablet on Mon, Wed, Fri.

## 2010-08-30 NOTE — Medication Information (Signed)
Summary: rov/ewj  Anticoagulant Therapy  Managed by: Bethena Midget, RN, BSN Referring MD: Juanda Chance MD, Bruce PCP: DR Bryson Corona MD: Gala Romney MD, Reuel Boom Indication 1: Atrial Fibrillation (427.31) Lab Used: [facility] [place] Site: [address] INR POC 2.0 INR RANGE 2-3  Dietary changes: no    Health status changes: no    Bleeding/hemorrhagic complications: no    Recent/future hospitalizations: no    Any changes in medication regimen? no    Recent/future dental: no  Any missed doses?: no       Is patient compliant with meds? yes       Allergies: 1)  ! Penicillin 2)  Codeine 3)  Vicodin  Anticoagulation Management History:      The patient is taking warfarin and comes in today for a routine follow up visit.  Positive risk factors for bleeding include an age of 75 years or older.  The bleeding index is 'intermediate risk'.  Positive CHADS2 values include History of HTN and Age > 61 years old.  Her last INR was 3.0.  Anticoagulation responsible provider: Porfirio Bollier MD, Reuel Boom.  INR POC: 2.0.  Cuvette Lot#: [id number].  Exp: 11/2010.    Anticoagulation Management Assessment/Plan:      The patient's current anticoagulation dose is Warfarin sodium 5 mg tabs: Use as directed by Anticoagulation Clinic.  The target INR is 2.0-3.0.  The next INR is due 11/03/2009.  Anticoagulation instructions were given to patient.  Results were reviewed/authorized by Bethena Midget, RN, BSN.  She was notified by Bethena Midget, RN, BSN.         Prior Anticoagulation Instructions: INR 2.3  Continue on same dosage 1/2 tablet daily except 1 tablet on Mondays, Wednesdays, and Fridays.  Recheck in 4 weeks.    Current Anticoagulation Instructions: INR 2.0 Continue 2.5mg s dailly except 5mg s on Mondays, Wednesdays and Fridays. Recheck in 4 weeks.

## 2010-08-30 NOTE — Assessment & Plan Note (Signed)
Summary: PER CHECK OUT   Primary Provider:  DR Valentina Lucks   History of Present Illness: The patient is 77 years old and return for management of atrial fibrillation. She was hospitalized in October of 2010 with chest pain and underwent catheterization and had minimal nonobstructive coronary disease. She had about 70% renal artery stenosis. She went into atrial fibrillation at time of catheterization and converted about 48 hours later just before we were planning to do DC cardioversion. She reverted back to atrial fibrillation later and was quite symptomatic. We started her on flecainide with plans for cardioversion but when she came in she was in sinus rhythm.  She has done quite well since that time has had no recurrent palpitations. She's also had no chest pain or shortness of breath.  Her other major problem is hypertension. Her pressure was elevated today but she says when she takes it at home it runs in the range of 120/50  Current Medications (verified): 1)  Aspirin 81 Mg Tbec (Aspirin) .... Take One Tablet By Mouth Daily 2)  Acetaminophen 325 Mg Tabs (Acetaminophen) .... Take 2 Tablets Every 4 Hours As Needed. 3)  Diovan Hct 160-25 Mg Tabs (Valsartan-Hydrochlorothiazide) .... Take 1 Tablet Daily 4)  Icaps Areds Formula  Tabs (Multiple Vitamins-Minerals) 5)  Bilberry Supplement 6)  Cinnamon 500 Mg Caps (Cinnamon) 7)  Fish Oil  Oil (Fish Oil) 8)  Multivitamins   Tabs (Multiple Vitamin) .... Take One Tablet By Mouth Daily 9)  Warfarin Sodium 5 Mg Tabs (Warfarin Sodium) .... Use As Directed By Anticoagulation Clinic 10)  Flecainide Acetate 100 Mg Tabs (Flecainide Acetate) .... Take One Tablet By Mouth Every 12 Hours  Allergies (verified): 1)  ! Penicillin 2)  Codeine 3)  Vicodin  Review of Systems       ROS is negative except as outlined in HPI.   Vital Signs:  Patient profile:   77 year old female Height:      66 inches Weight:      168 pounds BMI:     27.21 Pulse rate:   64  / minute Resp:     16 per minute BP sitting:   180 / 90  (left arm)  Vitals Entered By: Marrion Coy, CNA (September 08, 2009 2:24 PM)  Physical Exam  Additional Exam:  Gen. Well-nourished, in no distress   Neck: No JVD, thyroid not enlarged, no carotid bruits Lungs: No tachypnea, clear without rales, rhonchi or wheezes Cardiovascular: Rhythm regular, PMI not displaced,  heart sounds  normal, no murmurs or gallops, no peripheral edema, pulses normal in all 4 extremities. Abdomen: BS normal, abdomen soft and non-tender without masses or organomegaly, no hepatosplenomegaly. MS: No deformities, no cyanosis or clubbing   Neuro:  No focal sns   Skin:  no lesions    Impression & Recommendations:  Problem # 1:  ATRIAL FIBRILLATION (ICD-427.31)  She converted to sinus rhythm on flecainide and fortunately she appears to have maintained sinus rhythm since that time with no symptomatic recurrences. She is on both Coumadin and aspirin and has only minimal nonobstructive coronary disease. I think her risk of combination Coumadin aspirin outweighs the benefit of aspirin and we'll plan to stop the aspirin. The following medications were removed from the medication list:    Metoprolol Tartrate 50 Mg Tabs (Metoprolol tartrate) .Marland Kitchen... Take one tablet by mouth twice a day    Aspirin 81 Mg Tbec (Aspirin) .Marland Kitchen... Take one tablet by mouth daily Her updated medication list for  this problem includes:    Warfarin Sodium 5 Mg Tabs (Warfarin sodium) ..... Use as directed by anticoagulation clinic    Flecainide Acetate 100 Mg Tabs (Flecainide acetate) .Marland Kitchen... Take one tablet by mouth every 12 hours  Orders: EKG w/ Interpretation (93000)  Problem # 2:  HYPERTENSION, BENIGN (ICD-401.1) Her blood pressure was elevated today but she says it runs normal at home and arrange 120/50. We will continue current medications. The following medications were removed from the medication list:    Metoprolol Tartrate 50 Mg Tabs  (Metoprolol tartrate) .Marland Kitchen... Take one tablet by mouth twice a day    Aspirin 81 Mg Tbec (Aspirin) .Marland Kitchen... Take one tablet by mouth daily Her updated medication list for this problem includes:    Diovan Hct 160-25 Mg Tabs (Valsartan-hydrochlorothiazide) .Marland Kitchen... Take 1 tablet daily  Problem # 3:  RENAL ARTERY STENOSIS (ICD-440.1) She has 70% renal artery stenosis. This has not impaired her renal function and by her blood pressure readings at home she is under good control. This requires no treatment at present.  Patient Instructions: 1)  Your physician recommends that you schedule a follow-up appointment in: 4 months. 2)  Your physician has recommended you make the following change in your medication: 1) STOP aspirin

## 2010-10-16 LAB — BASIC METABOLIC PANEL
Creatinine, Ser: 1.01 mg/dL (ref 0.4–1.2)
GFR calc non Af Amer: 53 mL/min — ABNORMAL LOW (ref >60)

## 2010-10-16 LAB — CBC
HCT: 35.1 % — ABNORMAL LOW (ref 36.0–46.0)
Hemoglobin: 12 g/dL (ref 12.0–15.0)
MCHC: 34 g/dL (ref 30.0–36.0)
MCV: 88.6 fL (ref 78.0–100.0)
WBC: 9.4 10*3/uL (ref 4.0–10.5)

## 2010-10-16 LAB — APTT: aPTT: 40 seconds — ABNORMAL HIGH (ref 24–37)

## 2010-10-16 LAB — PROTIME-INR: INR: 2.75 — ABNORMAL HIGH (ref 0.00–1.49)

## 2010-11-03 LAB — HEMOGLOBIN A1C: Mean Plasma Glucose: 140 mg/dL

## 2010-11-03 LAB — CARDIAC PANEL(CRET KIN+CKTOT+MB+TROPI)
CK, MB: 3.4 ng/mL (ref 0.3–4.0)
Total CK: 143 U/L (ref 7–177)

## 2010-11-03 LAB — COMPREHENSIVE METABOLIC PANEL
AST: 23 U/L (ref 0–37)
Albumin: 3.2 g/dL — ABNORMAL LOW (ref 3.5–5.2)
Chloride: 105 mEq/L (ref 96–112)
Creatinine, Ser: 0.65 mg/dL (ref 0.4–1.2)
GFR calc Af Amer: >60 mL/min (ref >60)
Potassium: 4 mEq/L (ref 3.5–5.1)
Total Bilirubin: 0.6 mg/dL (ref 0.3–1.2)
Total Protein: 5.8 g/dL — ABNORMAL LOW (ref 6.0–8.3)

## 2010-11-03 LAB — CBC
HCT: 30.6 % — ABNORMAL LOW (ref 36.0–46.0)
HCT: 31.2 % — ABNORMAL LOW (ref 36.0–46.0)
HCT: 32.9 % — ABNORMAL LOW (ref 36.0–46.0)
Hemoglobin: 10.3 g/dL — ABNORMAL LOW (ref 12.0–15.0)
Hemoglobin: 10.5 g/dL — ABNORMAL LOW (ref 12.0–15.0)
Hemoglobin: 11 g/dL — ABNORMAL LOW (ref 12.0–15.0)
MCHC: 33.5 g/dL (ref 30.0–36.0)
MCHC: 33.7 g/dL (ref 30.0–36.0)
MCV: 89.9 fL (ref 78.0–100.0)
MCV: 90.2 fL (ref 78.0–100.0)
MCV: 90.3 fL (ref 78.0–100.0)
MCV: 90.8 fL (ref 78.0–100.0)
Platelets: 147 10*3/uL — ABNORMAL LOW (ref 150–400)
Platelets: 157 10*3/uL (ref 150–400)
RBC: 3.39 MIL/uL — ABNORMAL LOW (ref 3.87–5.11)
RDW: 13.9 % (ref 11.5–15.5)
RDW: 13.9 % (ref 11.5–15.5)
RDW: 14 % (ref 11.5–15.5)
RDW: 14.2 % (ref 11.5–15.5)
WBC: 10.1 10*3/uL (ref 4.0–10.5)
WBC: 7.5 10*3/uL (ref 4.0–10.5)

## 2010-11-03 LAB — BASIC METABOLIC PANEL
CO2: 26 mEq/L (ref 19–32)
CO2: 29 mEq/L (ref 19–32)
Calcium: 9 mg/dL (ref 8.4–10.5)
Calcium: 9.6 mg/dL (ref 8.4–10.5)
Chloride: 102 mEq/L (ref 96–112)
Chloride: 106 mEq/L (ref 96–112)
Creatinine, Ser: 0.76 mg/dL (ref 0.4–1.2)
GFR calc Af Amer: >60 mL/min (ref >60)
GFR calc non Af Amer: >60 mL/min (ref >60)
Glucose, Bld: 134 mg/dL — ABNORMAL HIGH (ref 70–99)
Glucose, Bld: 162 mg/dL — ABNORMAL HIGH (ref 70–99)
Potassium: 3.7 mEq/L (ref 3.5–5.1)
Potassium: 3.8 mEq/L (ref 3.5–5.1)
Sodium: 137 mEq/L (ref 135–145)
Sodium: 139 mEq/L (ref 135–145)

## 2010-11-03 LAB — DIFFERENTIAL
Eosinophils Absolute: 0.3 10*3/uL (ref 0.0–0.7)
Eosinophils Relative: 4 % (ref 0–5)
Lymphocytes Relative: 29 % (ref 12–46)
Monocytes Absolute: 0.7 10*3/uL (ref 0.1–1.0)
Neutrophils Relative %: 58 % (ref 43–77)

## 2010-11-03 LAB — LIPID PANEL
HDL: 75 mg/dL (ref >39)
Total CHOL/HDL Ratio: 2.6 RATIO
VLDL: 5 mg/dL (ref 0–40)

## 2010-11-03 LAB — CK TOTAL AND CKMB (NOT AT ARMC): CK, MB: 3.9 ng/mL (ref 0.3–4.0)

## 2010-11-03 LAB — HEPARIN LEVEL (UNFRACTIONATED)
Heparin Unfractionated: 0.18 IU/mL — ABNORMAL LOW (ref 0.30–0.70)
Heparin Unfractionated: 0.53 IU/mL (ref 0.30–0.70)

## 2010-11-03 LAB — TROPONIN I: Troponin I: 0.03 ng/mL (ref 0.00–0.06)

## 2010-11-03 LAB — POCT CARDIAC MARKERS
CKMB, poc: 1.8 ng/mL (ref 1.0–8.0)
Myoglobin, poc: 29.1 ng/mL (ref 12–200)

## 2010-11-03 LAB — TSH
TSH: 1.79 u[IU]/mL (ref 0.350–4.500)
TSH: 3.078 u[IU]/mL (ref 0.350–4.500)

## 2010-12-16 NOTE — Discharge Summary (Signed)
[facility]  Patient:    FIDELA, CIESLAK                      MRN: [id number] Adm. Date:  [id number] Disc. Date: [id number] Attending:  Leslee Home D                           Discharge Summary  FINAL DIAGNOSES: 1. Left leg cellulitis. 2. Left facial cellulitis. 3. Bulging lumbar disc. 4. Streptococcus agalactiae sepsis. 5. New systolic murmur, somewhat resolved. 6. Elevated blood pressure. 7. Hematuria, resolved. 8. Mild hyperglycemia, to be followed up as an outpatient.  PLAN:  Patient will be given followup for repeat evaluation of blood pressures and blood sugars and urinalysis as an outpatient and will continue on Levaquin for 10 more days and all this is pending the results of patients 2-D echo prior to discharge.  PROGNOSIS:  Good. DD:  12/27/99 TD:  12/30/99 Job: [id number]

## 2010-12-16 NOTE — H&P (Signed)
[facility]  Patient:    Jasmine Jenkins, Jasmine Jenkins                      MRN: [id number] Adm. Date:  [id number] Attending:  Leslee Home D                         History and Physical  PATIENT PRESENTATION:  This is a 77 year old white female.  CHIEF COMPLAINT:  Patient developed severe low back pain the p.m. prior to admission.  HISTORY OF PRESENT ILLNESS:  Patient has known lumbar disk disease, status post laminectomy 11 years prior to admission, and developed severe low back pain this p.m.  She states she had similar pain when she developed her lumbar disk 11 years ago.  Patient states she also had low back pain two months ago and at that point, she saw a chiropractor with minimal relief.  At that time also, the pain radiated to the front of the legs and knees; this was bilateral pain on the front parts of both lower legs.  Patient denies recent hematuria or burning on urination or frequency of urination.  She has had an elevated temperature on the p.m. of admission.  Patient received Cipro x 1 in the emergency room with minimal relief of symptoms.  Patient has had recurrent nausea and vomiting associated with low back pain on the day of and prior to admission.  Patient also had recurrent fever and chills.  MEDICATIONS:  None.  PAST MEDICAL HISTORY:  History of lumbar disk disease in the past, as mentioned, and had lumbar laminectomy.  Denies diabetes mellitus, peptic ulcer disease or dyspepsia.  She has had previous hiatal hernia problems.  Denies coronary artery disease, CVA or cancer.  She has had a previous pneumonia, years ago, but denies glaucoma and has had cataract in her left eye.  PAST SURGICAL HISTORY:  Bilateral femoral herniae.  Had a laminectomy 11 years ago.  ALLERGIES:  PENICILLIN -- acute bronchospasm.  Also has GI symptoms of nausea and vomiting with NSAIDS and also GI intolerance with nausea and vomiting secondary to CODEINE and  VICODIN.  FAMILY HISTORY:  Patients mother died of a gunshot wound.  Father died of a hernia rupture and inoperable.  She has two children, a boy and a girl, who are alive and well.  No other family medical problems or history of medical problems.  SOCIAL HISTORY:  Patient denies the use of cigarettes, alcohol or drug usage. She is married and lives with husband and is a former housewife by occupation.  REVIEW OF SYSTEMS:  GENERAL:  No weight changes.  HEENT:  Left facial redness and swelling that has cleared recently with topical steroid medication. PULMONARY:  No complaints.  CARDIOVASCULAR:  No complaints.  GI:  Recurrent nausea and vomiting.  GU:  No complaints.  BACK:  Positive back pain. MUSCULOSKELETAL:  Increased pain with ambulation and can use bedside commode. NEUROLOGIC:  No complaints.  HEMATOPOIETIC:  No complaints.  PHYSICAL EXAMINATION:  GENERAL:  Patient appears as a well-developed, well-nourished white female complaining of low back pain.  Has had some minimal relief with Demerol and Phenergan and that was tolerated.  VITAL SIGNS:  Blood pressure 169/81, temperature is 100.5 degrees Fahrenheit, pulse was 70 per minute and respirations are 20 per minute.  HEENT:  PERRL.  Sclerae clear.  Funduscopic examination is negative on both sides.  Oral cavity unremarkable.  Patient demonstrates left  lower facial edema with erythema, which she has had over the last few days.  Oral cavity is normal with normal-appearing dentition.  NECK:  Supple.  No JVD or lymphadenopathy.  Trachea midline.  Thyroid normal size.  LUNGS:  Clear to auscultation in all lobes.  CARDIAC:  Normal sinus rhythm.  Patient has a slightly grade 2/6 murmur, however, heard best along the left sternal border and right sternal border also audible.  Patient has no abnormal precordial pulsations.  No extra sounds were audible.  ABDOMEN:  Soft.  There is no organomegaly, mass or tenderness  present. Patient has two bilateral femoral hernia scars.  GENITALIA:  Negative.  BACK:  Examination reveals L3-L5 lumbar laminectomy scar with some scoliosis.  EXTREMITIES:  Left lower leg cellulitis with slight edema present, nontender; otherwise, normal-appearing extremities, normal distal pulses.  NEUROLOGIC:  Patient is oriented to time, place and person.  Cranial nerves II-XII are grossly intact.  Reflexes are +1 and symmetrical in biceps, triceps and wrist extensors.  Plantar signs are both downgoing.  LABORATORY AND X-RAY FINDINGS:  Urinalysis revealed positive blood.  LS spine revealed degenerative changes and scoliosis.  Hemoglobin was 13.5, WBC is 23.8, platelet count 217,000.  Urine shows positive blood.  Chest x-ray pending.  EKG pending.  Spiral CT is pending at the time of this dictation.  IMPRESSION: 1. Acute onset of sepsis, most likely possible urosepsis, rule out acute    nephrolithiasis. 2. Significant cellulitis of the left lower leg and also left facial area --    with history of lumbar disk disease, rule out significant osteomyelitis.  PLAN:  Patient will be admitted to a medical ward.  We will continue current antibiotics, which she was started on the emergency room, which was Cipro. She will receive IV fluids and IV pain medication and will complete spiral CT and bone scan evaluations.  She has a mild hypokalemia and will be treated with additional potassium in her IV and will be given a clear fluid diet. DD:  12/21/99 TD:  12/21/99 Job: [id number]

## 2010-12-16 NOTE — Discharge Summary (Signed)
[facility]  Patient:    Jasmine Jenkins, Jasmine Jenkins                      MRN: [id number] Adm. Date:  [id number] Disc. Date: [id number] Attending:  Leslee Home D                           Discharge Summary  CHIEF COMPLAINT:  Patient admitted with a chief complaint of severe low back pain, left leg swelling and redness and left facial swelling and redness.  HOSPITAL COURSE:  She was seen and evaluated and also had a significant systolic murmur heard on the right sternal border and on the left sternal border.  Patient was seen and evaluated and murmur was a new finding.  Patient had previous L3-L5 lumbar laminectomy and had previous lumbar laminectomy scar present on examination.  On evaluation, patients white count showed 23,800 with hemoglobin of 13.5, platelet count of 217,000.  Patient otherwise had blood cultures which showed group B beta strep, Streptococcus agalactiae. Patient was placed on IV vancomycin therapy and her fever defervesced, her nausea and vomiting defervesced and gradually her left leg cellulitis improved as well as the left facial cellulitis improved; her systolic murmur also improved during her stay.  She was also found to have elevated blood pressure readings and hematuria present, which was repeated on more than one occasion. Patient was also found to have mildly elevated blood sugars and will need followup on these as well.  Her last urinalysis on microscopic showed negative blood.  Just prior to discharge, her last CBC showed a white count of 9.9, hemoglobin 11.8, platelet count of 265,000.  CMP showed elevated blood sugar of 116 and was within normal limits.  Two-dimensional echo was pending at the time of this discharge and if negative, patient will be allowed to return home.  DISCHARGE MEDICATIONS:  She will remain on Levaquin 500 mg once a day for an additional 10 days with one refill if needed for continued treatment of  her cellulitis.  Of note, patients group B strep was sensitive to both vancomycin and Levaquin.  FOLLOWUP:  Patient will be followed up for her elevated blood pressure and elevated blood sugars as well as her hematuria will be evaluated one more time. DD:  12/27/99 TD:  12/30/99 Job: [id number]

## 2011-10-04 ENCOUNTER — Telehealth: Payer: Self-pay | Admitting: Cardiology

## 2011-10-04 NOTE — Telephone Encounter (Signed)
Cardiac Cah faxed to [facility] @ [phone] 10/04/11/KM

## 2011-11-27 ENCOUNTER — Encounter (HOSPITAL_COMMUNITY): Payer: Self-pay

## 2011-11-27 ENCOUNTER — Other Ambulatory Visit: Payer: Self-pay | Admitting: Interventional Cardiology

## 2011-11-29 ENCOUNTER — Ambulatory Visit (HOSPITAL_COMMUNITY): Payer: Medicare Other | Admitting: Certified Registered"

## 2011-11-29 ENCOUNTER — Encounter (HOSPITAL_COMMUNITY): Payer: Self-pay | Admitting: Certified Registered"

## 2011-11-29 ENCOUNTER — Ambulatory Visit (HOSPITAL_COMMUNITY)
Admission: RE | Admit: 2011-11-29 | Discharge: 2011-11-29 | Disposition: A | Discharge status: Home / Self Care | Payer: Medicare Other | Source: Ambulatory Visit | Attending: Interventional Cardiology | Admitting: Interventional Cardiology

## 2011-11-29 ENCOUNTER — Encounter (HOSPITAL_COMMUNITY)
Admission: RE | Disposition: A | Discharge status: Home / Self Care | Payer: Self-pay | Source: Ambulatory Visit | Attending: Interventional Cardiology

## 2011-11-29 DIAGNOSIS — I10 Essential (primary) hypertension: Secondary | ICD-10-CM | POA: Insufficient documentation

## 2011-11-29 DIAGNOSIS — I4891 Unspecified atrial fibrillation: Secondary | ICD-10-CM | POA: Insufficient documentation

## 2011-11-29 DIAGNOSIS — R000 Tachycardia, unspecified: Secondary | ICD-10-CM | POA: Insufficient documentation

## 2011-11-29 HISTORY — PX: CARDIOVERSION: SHX1299

## 2011-11-29 SURGERY — CARDIOVERSION
Anesthesia: Monitor Anesthesia Care | Wound class: Clean

## 2011-11-29 MED ORDER — METOPROLOL SUCCINATE ER 50 MG PO TB24: ORAL_TABLET | ORAL | Status: DC

## 2011-11-29 MED ORDER — SODIUM CHLORIDE 0.9 % IV SOLN
INTRAVENOUS | Status: DC | PRN
  Administered 2011-11-29: 09:00:00 via INTRAVENOUS

## 2011-11-29 MED ORDER — PROPOFOL 10 MG/ML IV BOLUS
INTRAVENOUS | Status: DC | PRN
  Administered 2011-11-29: 60 mg via INTRAVENOUS

## 2011-11-29 NOTE — Anesthesia Procedure Notes (Signed)
Date/Time: 11/29/2011 9:02 AM Performed by: Jerilee Hoh Pre-anesthesia Checklist: Patient identified, Emergency Drugs available, Suction available and Patient being monitored Patient Re-evaluated:Patient Re-evaluated prior to inductionOxygen Delivery Method: Ambu bag Preoxygenation: Pre-oxygenation with 100% oxygen Intubation Type: IV induction Ventilation: Mask ventilation without difficulty Dental Injury: Teeth and Oropharynx as per pre-operative assessment

## 2011-11-29 NOTE — H&P (Signed)
  Date of Initial H&P: 11/24/11  History reviewed, patient examined, no change in status, stable for surgery.

## 2011-11-29 NOTE — Preoperative (Signed)
Beta Blockers   Reason not to administer Beta Blockers:Not Applicable 

## 2011-11-29 NOTE — Transfer of Care (Signed)
Immediate Anesthesia Transfer of Care Note  Patient: Jasmine Jenkins  Procedure(s) Performed: Procedure(s) (LRB): CARDIOVERSION (N/A)  Patient Location: Short Stay  Anesthesia Type: General  Level of Consciousness: awake, alert , oriented and patient cooperative  Airway & Oxygen Therapy: Patient Spontanous Breathing and Patient connected to nasal cannula oxygen  Post-op Assessment: Report given to PACU RN, Post -op Vital signs reviewed and stable and Patient moving all extremities  Post vital signs: Reviewed and stable  Complications: No apparent anesthesia complications

## 2011-11-29 NOTE — CV Procedure (Signed)
Electrical Cardioversion Procedure Note Jasmine Jenkins [id number] September 12, 1925  Procedure: Electrical Cardioversion Indications:  Atrial Fibrillation  Time Out: Verified patient identification, verified procedure,medications/allergies/relevent history reviewed, required imaging and test results available.  Performed  Procedure Details  The patient was NPO after midnight. Anesthesia was administered at the beside  by Dr. Kelly Splinter with 60 mg of propofol.  Cardioversion was done with synchronized biphasic defibrillation with AP pads with 120 J.  The patient converted to normal sinus rhythm. The patient tolerated the procedure well   IMPRESSION:  Successful cardioversion of atrial fibrillation to NSR.  Rec:  Continue Xarelto for at least 30 days post procedure for stroke prevention.  Continue amiodarone.  Will decrease metoprolol.    Jasmine Engebretsen S. 11/29/2011, 9:09 AM

## 2011-11-29 NOTE — Anesthesia Postprocedure Evaluation (Signed)
  Anesthesia Post-op Note  Patient: Jasmine Jenkins  Procedure(s) Performed: Procedure(s) (LRB): CARDIOVERSION (N/A)  Patient Location: Short Stay  Anesthesia Type: General  Level of Consciousness: awake, alert , oriented and patient cooperative  Airway and Oxygen Therapy: Patient Spontanous Breathing and Patient connected to nasal cannula oxygen  Post-op Pain: none  Post-op Assessment: Post-op Vital signs reviewed, Patient's Cardiovascular Status Stable, Respiratory Function Stable, Patent Airway and No signs of Nausea or vomiting  Post-op Vital Signs: Reviewed and stable  Complications: No apparent anesthesia complications

## 2011-11-29 NOTE — Anesthesia Preprocedure Evaluation (Addendum)
Anesthesia Evaluation  Patient identified by MRN, date of birth, ID band Patient awake    Reviewed: Allergy & Precautions, H&P , NPO status , Patient's Chart, lab work & pertinent test results, reviewed documented beta blocker date and time   Airway Mallampati: II TM Distance: >3 FB Neck ROM: Full    Dental  (+) Teeth Intact and Dental Advisory Given   Pulmonary  breath sounds clear to auscultation        Cardiovascular hypertension, Pt. on home beta blockers + dysrhythmias Atrial Fibrillation Rhythm:Irregular Rate:Tachycardia     Neuro/Psych    GI/Hepatic   Endo/Other    Renal/GU      Musculoskeletal   Abdominal   Peds  Hematology   Anesthesia Other Findings   Reproductive/Obstetrics                          Anesthesia Physical Anesthesia Plan  ASA: III  Anesthesia Plan: General   Post-op Pain Management:    Induction: Intravenous  Airway Management Planned: Mask  Additional Equipment:   Intra-op Plan:   Post-operative Plan:   Informed Consent: I have reviewed the patients History and Physical, chart, labs and discussed the procedure including the risks, benefits and alternatives for the proposed anesthesia with the patient or authorized representative who has indicated his/her understanding and acceptance.   Dental advisory given, History available from chart only and Only emergency history available  Plan Discussed with: CRNA, Anesthesiologist and Surgeon  Anesthesia Plan Comments:         Anesthesia Quick Evaluation

## 2011-11-29 NOTE — Discharge Instructions (Signed)
General Anesthetic, Adult A doctor specialized in giving anesthesia (anesthesiologist) or a nurse specialized in giving anesthesia (nurse anesthetist) gives medicine that makes you sleep while a procedure is performed (general anesthetic). Once the general anesthetic has been administered, you will be in a sleeplike state in which you feel no pain. After having a general anestheticyou may feel:   Dizzy.   Weak.   Drowsy.   Confused.  These feelings are normal and can be expected to last for up to 24 hours after the procedure is completed.  LET YOUR CAREGIVER KNOW ABOUT:  Allergies you have.   Medications you are taking, including herbs, eye drops, over the counter medications, dietary supplements, and creams.   Previous problems you have had with anesthetics or numbing medicines.   Use of cigarettes, alcohol, or illicit drugs.   Possibility of pregnancy, if this applies.   History of bleeding or blood disorders, including blood clots and clotting disorders.   Previous surgeries you have had and types of anesthetics you have received.   Family medical history, especially anesthetic problems.   Other health problems.  BEFORE THE PROCEDURE  You may brush your teeth on the morning of surgery but you should have no solid food or non-clear liquids for a minimum of 8 hours prior to your procedure. Clear liquids (water, black coffee, and tea) are acceptable in small amounts until 2 hours prior to your procedure.   You may take your regular medications the morning of your procedure unless your caregiver indicates otherwise.  AFTER THE PROCEDURE  After surgery, you will be taken to the recovery area where a nurse will monitor your progress. You will be allowed to go home when you are awake, stable, taking fluids well, and without serious pain or complications.   For the first 24 hours following an anesthetic:   Have a responsible person with you.   Do not drive a car. If you are  alone, do not take public transportation.   Do not engage in strenuous activity. You may usually resume normal activities the next day, or as advised by your caregiver.   Do not drink alcohol.   Do not take medicine that has not been prescribed by your caregiver.   Do not sign important papers or make important decisions as your judgement may be impaired.   You may resume a normal diet as directed.   Change bandages (dressings) as directed.   Only take over-the-counter or prescription medicines for pain, discomfort, or fever as directed by your caregiver.  If you have questions or problems that seem related to the anesthetic, call the hospital and ask for the anesthetist, anesthesiologist, or anesthesia department. SEEK IMMEDIATE MEDICAL CARE IF:   You develop a rash.   You have difficulty breathing.   You have chest pain.   You have allergic problems.   You have uncontrolled nausea.   You have uncontrolled vomiting.   You develop any serious bleeding, especially from the incision site.  Document Released: 10/24/2007 Document Revised: 07/06/2011 Document Reviewed: 11/17/2010 [employer] Patient Information 2012 [employer]. 

## 2011-11-30 ENCOUNTER — Encounter (HOSPITAL_COMMUNITY): Payer: Self-pay | Admitting: Interventional Cardiology

## 2012-10-01 ENCOUNTER — Other Ambulatory Visit: Payer: Self-pay | Admitting: Interventional Cardiology

## 2012-10-02 ENCOUNTER — Ambulatory Visit (HOSPITAL_COMMUNITY)
Admission: RE | Admit: 2012-10-02 | Discharge: 2012-10-02 | Disposition: A | Discharge status: Home / Self Care | Payer: Medicare Other | Source: Ambulatory Visit | Attending: Interventional Cardiology | Admitting: Interventional Cardiology

## 2012-10-02 ENCOUNTER — Encounter (HOSPITAL_COMMUNITY): Payer: Self-pay | Admitting: *Deleted

## 2012-10-02 ENCOUNTER — Encounter (HOSPITAL_COMMUNITY)
Admission: RE | Disposition: A | Discharge status: Home / Self Care | Payer: Self-pay | Source: Ambulatory Visit | Attending: Interventional Cardiology

## 2012-10-02 ENCOUNTER — Encounter (HOSPITAL_COMMUNITY): Payer: Self-pay | Admitting: Anesthesiology

## 2012-10-02 ENCOUNTER — Ambulatory Visit (HOSPITAL_COMMUNITY): Payer: Medicare Other | Admitting: Anesthesiology

## 2012-10-02 DIAGNOSIS — I4891 Unspecified atrial fibrillation: Secondary | ICD-10-CM

## 2012-10-02 HISTORY — DX: Gastro-esophageal reflux disease without esophagitis: K21.9

## 2012-10-02 HISTORY — DX: Personal history of other diseases of the digestive system: Z87.19

## 2012-10-02 HISTORY — PX: CARDIOVERSION: SHX1299

## 2012-10-02 HISTORY — DX: Peripheral vascular disease, unspecified: I73.9

## 2012-10-02 HISTORY — DX: Essential (primary) hypertension: I10

## 2012-10-02 HISTORY — DX: Chronic kidney disease, unspecified: N18.9

## 2012-10-02 HISTORY — DX: Nausea with vomiting, unspecified: R11.2

## 2012-10-02 HISTORY — DX: Unspecified osteoarthritis, unspecified site: M19.90

## 2012-10-02 HISTORY — DX: Other specified postprocedural states: Z98.890

## 2012-10-02 SURGERY — CARDIOVERSION
Anesthesia: General

## 2012-10-02 MED ORDER — SODIUM CHLORIDE 0.9 % IV SOLN
INTRAVENOUS | Status: DC
  Administered 2012-10-02: 500 mL via INTRAVENOUS

## 2012-10-02 MED ORDER — LIDOCAINE HCL (CARDIAC) 20 MG/ML IV SOLN
INTRAVENOUS | Status: DC | PRN
  Administered 2012-10-02: 40 mg via INTRAVENOUS

## 2012-10-02 MED ORDER — PROPOFOL 10 MG/ML IV BOLUS
INTRAVENOUS | Status: DC | PRN
  Administered 2012-10-02: 40 mg via INTRAVENOUS
  Administered 2012-10-02: 30 mg via INTRAVENOUS

## 2012-10-02 MED ORDER — LOSARTAN POTASSIUM 50 MG PO TABS: 50.0000 mg | ORAL_TABLET | Freq: Every day | ORAL | Status: DC

## 2012-10-02 NOTE — Anesthesia Postprocedure Evaluation (Signed)
  Anesthesia Post-op Note  Patient: Jasmine Jenkins  Procedure(s) Performed: Procedure(s): CARDIOVERSION (N/A)  Patient Location: PACU Endoscopy  Anesthesia Type:General  Level of Consciousness: awake, alert , oriented and patient cooperative  Airway and Oxygen Therapy: Patient Spontanous Breathing  Post-op Pain: none  Post-op Assessment: Post-op Vital signs reviewed, Patient's Cardiovascular Status Stable, Respiratory Function Stable and Patent Airway  Post-op Vital Signs: Reviewed and stable  Complications: No apparent anesthesia complications

## 2012-10-02 NOTE — H&P (Signed)
  Date of Initial H&P: 10/01/12  History reviewed, patient examined, no change in status, stable for surgery.

## 2012-10-02 NOTE — Anesthesia Procedure Notes (Signed)
Date/Time: 10/02/2012 1:27 PM Performed by: Angelica Pou Pre-anesthesia Checklist: Patient identified, Patient being monitored, Emergency Drugs available, Timeout performed and Suction available Patient Re-evaluated:Patient Re-evaluated prior to inductionOxygen Delivery Method: Ambu bag Preoxygenation: Pre-oxygenation with 100% oxygen Intubation Type: IV induction Ventilation: Mask ventilation without difficulty

## 2012-10-02 NOTE — Anesthesia Preprocedure Evaluation (Addendum)
Anesthesia Evaluation  Patient identified by MRN, date of birth, ID band Patient awake    Reviewed: Allergy & Precautions, H&P , NPO status , Patient's Chart, lab work & pertinent test results  History of Anesthesia Complications (+) PONV  Airway Mallampati: I TM Distance: >3 FB Neck ROM: Full    Dental no notable dental hx. (+) Teeth Intact and Dental Advisory Given   Pulmonary neg pulmonary ROS,  breath sounds clear to auscultation  Pulmonary exam normal       Cardiovascular hypertension, Pt. on medications + CAD and + Peripheral Vascular Disease + dysrhythmias Atrial Fibrillation Rhythm:Irregular Rate:Tachycardia     Neuro/Psych negative neurological ROS  negative psych ROS   GI/Hepatic negative GI ROS, Neg liver ROS, hiatal hernia, GERD-  Controlled,  Endo/Other  negative endocrine ROS  Renal/GU Renal diseaseAtherosclerosis of renal artery.   negative genitourinary   Musculoskeletal   Abdominal   Peds  Hematology negative hematology ROS (+)   Anesthesia Other Findings   Reproductive/Obstetrics negative OB ROS                          Anesthesia Physical Anesthesia Plan  ASA: III  Anesthesia Plan: General   Post-op Pain Management:    Induction: Intravenous  Airway Management Planned: Mask  Additional Equipment:   Intra-op Plan:   Post-operative Plan:   Informed Consent: I have reviewed the patients History and Physical, chart, labs and discussed the procedure including the risks, benefits and alternatives for the proposed anesthesia with the patient or authorized representative who has indicated his/her understanding and acceptance.   Dental advisory given  Plan Discussed with: CRNA and Surgeon  Anesthesia Plan Comments:        Anesthesia Quick Evaluation

## 2012-10-02 NOTE — Transfer of Care (Signed)
Immediate Anesthesia Transfer of Care Note  Patient: Jasmine Jenkins  Procedure(s) Performed: Procedure(s): CARDIOVERSION (N/A)  Patient Location: PACU and Endoscopy Unit  Anesthesia Type:General  Level of Consciousness: awake, alert , oriented and patient cooperative  Airway & Oxygen Therapy: Patient Spontanous Breathing  Post-op Assessment: Report given to PACU RN, Post -op Vital signs reviewed and stable and Patient moving all extremities  Post vital signs: Reviewed and stable  Complications: No apparent anesthesia complications

## 2012-10-02 NOTE — Preoperative (Signed)
Beta Blockers   Reason not to administer Beta Blockers:Not Applicable 

## 2012-10-02 NOTE — CV Procedure (Signed)
Electrical Cardioversion Procedure Note SUNSHYNE HORVATH [id number] [birth year]  Procedure: Electrical Cardioversion Indications:  Atrial Fibrillation  Time Out: Verified patient identification, verified procedure,medications/allergies/relevent history reviewed, required imaging and test results available.  Performed  Procedure Details  The patient was NPO after midnight. Anesthesia was administered at the beside  by Dr. Sampson Goon with 80 mg of propofol, 40 mg of Lidocaine.  Cardioversion was done with synchronized biphasic defibrillation with AP pads with 100J.  The patient converted to normal sinus rhythm. The patient tolerated the procedure well   IMPRESSION:  Successful cardioversion to NSR from atrial fibrillation.  Continue Xarelto for at least a month and likely longer.  I am concerned as to whether the cardioversion result will hold, since she is not on an antiarrhythmic.  She did not tolerate amiodarone which was used after her last cardioversion, despite its success in maintaining NSR. A few months after stopping the amiodarone, AFib returned.   I am referring to EP to see if they would recommend Tikosyn.      VARANASI,JAYADEEP S. 10/02/2012, 1:32 PM

## 2012-10-03 ENCOUNTER — Encounter (HOSPITAL_COMMUNITY): Payer: Self-pay | Admitting: Interventional Cardiology

## 2012-10-25 ENCOUNTER — Ambulatory Visit (INDEPENDENT_AMBULATORY_CARE_PROVIDER_SITE_OTHER): Payer: Medicare Other | Admitting: Internal Medicine

## 2012-10-25 ENCOUNTER — Encounter: Payer: Self-pay | Admitting: Internal Medicine

## 2012-10-25 VITALS — BP 141/97 | HR 124 | Ht 66.0 in | Wt 162.0 lb

## 2012-10-25 DIAGNOSIS — I10 Essential (primary) hypertension: Secondary | ICD-10-CM

## 2012-10-25 DIAGNOSIS — I4891 Unspecified atrial fibrillation: Secondary | ICD-10-CM

## 2012-10-25 NOTE — Patient Instructions (Signed)
Your physician recommends that you schedule a follow-up appointment as needed.   Your physician recommends that you continue on your current medications as directed. Please refer to the Current Medication list given to you today.  

## 2012-10-26 ENCOUNTER — Encounter: Payer: Self-pay | Admitting: Internal Medicine

## 2012-10-26 NOTE — Assessment & Plan Note (Signed)
The patient has symptomatic persistent atrial fibrillation.  She has uncontrolled ventricular rates. She has not tolerated multiple medications including metoprolol, ditliazem, and amiodarone.  She is very clear in her decision to decline any other antiarrhythmic options (tikosyn) at this time.  Given her advanced age, she is not a good candidate for afib ablation.  In the setting of her refractory persistent atrial fibrillation, I would anticipate that her success rates with ablation are substatially reduced (50-65%) with a high likelihood of multiple procedures required.  I have therefore advised against ablation.  Given poorly controlled rates and intolerances of medicines, I would favor pacemaker implantation with AV nodal ablation.  At this time, however, she is clear that she would like to avoid this procedure.   Other options would be to restart and titrate verapamil or to consider a different beta blocker such as nadolol. Unfortunately, Jasmine Jenkins is very frustrated by her atrial fibrillation.  She is reluctant to consider my suggestions today.  The importance of lifelong anticoagulation was stressed today due to increased stroke risks.   She will follow-up with Dr Eldridge Dace and I will see as needed going forward.

## 2012-10-26 NOTE — Progress Notes (Signed)
Primary Care Physician: Lillia Mountain, MD Referring Physician:  Dr Simone Curia Grumbine is a 79 y.o. female with a h/o persistent atrial fibrillation who presents today for EP consultation.  She reorts that she was initially diagnosed with atrial fibrillation in 2010.  This apparently was observed during cardiac cath and spontaneously converted to sinus rhythm.  She developed recurrent afib in 2012 and required cardioversion.  She was again cardioverted 5/14.  She thinks that she was able to maintain sinus rhythm for until early 2014 when she again returned to afib.  She was cardioverted by Dr Abe People but returned to afib within several weeks.  She has previously not tolerated metoprolol, diltiazem, or amiodarone.  She recently did tolerate verapamil 180mg  daily but doesn't recall feeling much better with this. During afib, she reports symptoms of fatigue and decreased exercise tolerance. Today, she denies symptoms of palpitations, chest pain, shortness of breath, orthopnea, PND, lower extremity edema, dizziness, presyncope, syncope, or neurologic sequela. The patient is tolerating medications without difficulties and is otherwise without complaint today.   Past Medical History  Diagnosis Date  . Persistent atrial fibrillation   . Hypertension   . GERD (gastroesophageal reflux disease)   . Chronic kidney disease   . H/O hiatal hernia   . Peripheral vascular disease   . Arthritis   . Renal artery stenosis   . Mild pulmonary hypertension   . Diabetes    Past Surgical History  Procedure Laterality Date  . Cardioversion  11/29/2011    Procedure: CARDIOVERSION;  Surgeon: Corky Crafts, MD;  Location: [facility] OR;  Service: Cardiovascular;  Laterality: N/A;  . Cardiac catheterization    . Hernia repair    . Back surgery    . Cardioversion N/A 10/02/2012    Procedure: CARDIOVERSION;  Surgeon: Corky Crafts, MD;  Location: [facility] ENDOSCOPY;  Service: Cardiovascular;  Laterality:  N/A;    Current Outpatient Prescriptions  Medication Sig Dispense Refill  . Bilberry, Vaccinium myrtillus, (BILBERRY PO) Take 1 tablet by mouth daily.      . Biotin 5000 MCG CAPS Take 1 capsule by mouth daily.      Marland Kitchen losartan (COZAAR) 50 MG tablet Take 1 tablet (50 mg total) by mouth daily.      . Multiple Vitamins-Minerals (ICAPS AREDS FORMULA PO) Take 1 capsule by mouth daily.      . Omega-3 Fatty Acids (FISH OIL PO) Take 2 tablets by mouth daily.      . Rivaroxaban (XARELTO) 20 MG TABS Take 1 tablet by mouth daily.       No current facility-administered medications for this visit.    Allergies  Allergen Reactions  . Penicillins Anaphylaxis  . Codeine     REACTION: GI Intolerance  . Hydrocodone-Acetaminophen     REACTION: GI Intolerance    History   Social History  . Marital Status: Married    Spouse Name: N/A    Number of Children: N/A  . Years of Education: N/A   Occupational History  . Not on file.   Social History Main Topics  . Smoking status: Never Smoker   . Smokeless tobacco: Not on file  . Alcohol Use: No  . Drug Use: No  . Sexually Active: Yes   Other Topics Concern  . 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   Married    Family History  Problem Relation Age of Onset  . Hypertension  ROS- All systems are reviewed and negative except as per the HPI above  Physical Exam: Filed Vitals:   10/25/12 1618  BP: 141/97  Pulse: 124  Height: 5\' 6"  (1.676 m)  Weight: 162 lb (73.483 kg)    GEN- The patient is elderly appearing, alert and oriented x 3 today.   Head- normocephalic, atraumatic Eyes-  Sclera clear, conjunctiva pink Ears- hearing intact Oropharynx- clear Neck- supple,  Lungs- Clear to ausculation bilaterally, normal work of breathing Heart- irregular rate and rhythm, no murmurs, rubs or gallops, PMI not laterally displaced GI- soft, NT, ND, + BS Extremities- no clubbing, cyanosis, or edema MS- no  significant deformity or atrophy Skin- no rash or lesion Psych- euthymic mood, full affect Neuro- strength and sensation are intact  EKG today reveals afib, V rate 139 bpm, nonspecific ST/T changes Echo 10/23/11- EF 60-65%, mild LVH, mild MR, mild to mod TR, LA 43, RVSP 42 Records from Dr Hoyle Barr office are reviewed  Assessment and Plan:

## 2012-10-26 NOTE — Assessment & Plan Note (Signed)
Stable No change required today  

## 2013-04-19 ENCOUNTER — Other Ambulatory Visit: Payer: Self-pay | Admitting: Interventional Cardiology

## 2013-04-19 DIAGNOSIS — I4891 Unspecified atrial fibrillation: Secondary | ICD-10-CM

## 2013-04-19 DIAGNOSIS — Z79899 Other long term (current) drug therapy: Secondary | ICD-10-CM

## 2013-08-11 ENCOUNTER — Encounter: Payer: Self-pay | Admitting: Internal Medicine

## 2013-08-28 ENCOUNTER — Encounter: Payer: Self-pay | Admitting: Internal Medicine

## 2013-09-08 ENCOUNTER — Ambulatory Visit: Payer: Medicare Other | Admitting: Internal Medicine

## 2013-10-13 ENCOUNTER — Other Ambulatory Visit (INDEPENDENT_AMBULATORY_CARE_PROVIDER_SITE_OTHER): Payer: Medicare Other

## 2013-10-13 DIAGNOSIS — Z79899 Other long term (current) drug therapy: Secondary | ICD-10-CM

## 2013-10-13 DIAGNOSIS — I4891 Unspecified atrial fibrillation: Secondary | ICD-10-CM

## 2013-10-13 LAB — CBC
HEMATOCRIT: 41.7 % (ref 36.0–46.0)
HEMOGLOBIN: 13.6 g/dL (ref 12.0–15.0)
MCHC: 32.6 g/dL (ref 30.0–36.0)
MCV: 89.4 fl (ref 78.0–100.0)
Platelets: 187 10*3/uL (ref 150.0–400.0)
RBC: 4.66 Mil/uL (ref 3.87–5.11)
RDW: 14.9 % — AB (ref 11.5–14.6)
WBC: 8.6 10*3/uL (ref 4.5–10.5)

## 2013-10-13 LAB — CREATININE, SERUM: Creatinine, Ser: 0.7 mg/dL (ref 0.4–1.2)

## 2013-10-24 ENCOUNTER — Other Ambulatory Visit: Payer: Self-pay | Admitting: Cardiology

## 2013-10-24 DIAGNOSIS — I4891 Unspecified atrial fibrillation: Secondary | ICD-10-CM

## 2014-04-15 ENCOUNTER — Other Ambulatory Visit (INDEPENDENT_AMBULATORY_CARE_PROVIDER_SITE_OTHER): Payer: Medicare Other

## 2014-04-15 DIAGNOSIS — I4891 Unspecified atrial fibrillation: Secondary | ICD-10-CM

## 2014-04-15 LAB — BASIC METABOLIC PANEL
BUN: 20 mg/dL (ref 6–23)
CALCIUM: 9.4 mg/dL (ref 8.4–10.5)
CHLORIDE: 103 meq/L (ref 96–112)
CO2: 29 meq/L (ref 19–32)
CREATININE: 0.8 mg/dL (ref 0.4–1.2)
GFR: 70.2 mL/min (ref >60.00)
Glucose, Bld: 192 mg/dL — ABNORMAL HIGH (ref 70–99)
Potassium: 3.9 mEq/L (ref 3.5–5.1)
SODIUM: 137 meq/L (ref 135–145)

## 2014-04-15 LAB — CBC
HEMATOCRIT: 39.1 % (ref 36.0–46.0)
HEMOGLOBIN: 12.8 g/dL (ref 12.0–15.0)
MCHC: 32.7 g/dL (ref 30.0–36.0)
MCV: 90.4 fl (ref 78.0–100.0)
Platelets: 179 10*3/uL (ref 150.0–400.0)
RBC: 4.33 Mil/uL (ref 3.87–5.11)
RDW: 14.2 % (ref 11.5–15.5)
WBC: 6.6 10*3/uL (ref 4.0–10.5)

## 2014-04-22 NOTE — Progress Notes (Signed)
Pt due for f/u March 2016, we will draw NOAC labs at OV.

## 2014-06-24 ENCOUNTER — Other Ambulatory Visit (HOSPITAL_COMMUNITY): Payer: Self-pay | Admitting: Internal Medicine

## 2014-06-24 DIAGNOSIS — R1310 Dysphagia, unspecified: Secondary | ICD-10-CM

## 2014-07-09 ENCOUNTER — Ambulatory Visit (HOSPITAL_COMMUNITY)
Admission: RE | Admit: 2014-07-09 | Discharge: 2014-07-09 | Disposition: A | Discharge status: Home / Self Care | Payer: Medicare Other | Source: Ambulatory Visit | Attending: Internal Medicine | Admitting: Internal Medicine

## 2014-07-09 DIAGNOSIS — R1312 Dysphagia, oropharyngeal phase: Secondary | ICD-10-CM | POA: Diagnosis present

## 2014-07-09 DIAGNOSIS — R1310 Dysphagia, unspecified: Secondary | ICD-10-CM | POA: Diagnosis not present

## 2014-07-09 DIAGNOSIS — R0989 Other specified symptoms and signs involving the circulatory and respiratory systems: Secondary | ICD-10-CM | POA: Insufficient documentation

## 2014-07-09 NOTE — Procedures (Signed)
Objective Swallowing Evaluation: Modified Barium Swallowing Study  Patient Details  Name: Jasmine Jenkins MRN: [id number] Date of Birth: 10-Jun-1926  Today's Date: 07/09/2014 Time: 1517-6160 SLP Time Calculation (min) (ACUTE ONLY): 23 min  Past Medical History:  Past Medical History  Diagnosis Date  . Persistent atrial fibrillation   . Hypertension   . GERD (gastroesophageal reflux disease)   . Chronic kidney disease   . H/O hiatal hernia   . Peripheral vascular disease   . Arthritis   . Renal artery stenosis   . Mild pulmonary hypertension   . Diabetes   . Diverticulosis   . Streptococcal sepsis   . Chronic back pain   . Lower extremity edema    Past Surgical History:  Past Surgical History  Procedure Laterality Date  . Cardioversion  11/29/2011    Procedure: CARDIOVERSION;  Surgeon: Corky Crafts, MD;  Location: [facility] OR;  Service: Cardiovascular;  Laterality: N/A;  . Cardiac catheterization    . Hernia repair    . Back surgery    . Cardioversion N/A 10/02/2012    Procedure: CARDIOVERSION;  Surgeon: Corky Crafts, MD;  Location: [facility] ENDOSCOPY;  Service: Cardiovascular;  Laterality: N/A;   HPI:  80 yo female presents for an outpatient swallow study with subjective c/o difficulty swallowing food/liquid, globus sensation, belching, and coughing up secretions during intake. PMH significant for GERD and hiatal hernia.     Assessment / Plan / Recommendation Clinical Impression  Dysphagia Diagnosis: Suspected primary esophageal dysphagia  Clinical impression: Pt's oropharyngeal swallow is within gross functional limits for age, with mildly prolonged time between oral transit and pharyngeal swallow initiation. Flash penetration occurred x1 as a result, however pt demonstrates appropriate airway protection and pharyngeal clearance throughout trials. Of note, patient did exhibit retention of barium in the thoracic esophagus per MD, which may be associated with pt's  subjective complaints. Suspect a primary esophageal dysphagia with additional f/u recommended.    Treatment Recommendation  No treatment recommended at this time    Diet Recommendation Regular;Thin liquid   Liquid Administration via: Cup;Straw Medication Administration: Whole meds with liquid Supervision: Patient able to self feed Compensations: Slow rate;Small sips/bites;Follow solids with liquid Postural Changes and/or Swallow Maneuvers: Seated upright 90 degrees;Upright 30-60 min after meal    Other  Recommendations Recommended Consults: Consider esophageal assessment Oral Care Recommendations: Oral care BID   Follow Up Recommendations  None    Frequency and Duration        Pertinent Vitals/Pain n/a    SLP Swallow Goals     General Date of Onset:  (several years ago, has gotten progressively worse) HPI: 80 yo female presents for an outpatient swallow study with subjective c/o difficulty swallowing food/liquid, globus sensation, belching, and coughing up secretions during intake. PMH significant for GERD and hiatal hernia. Type of Study: Modified Barium Swallowing Study Reason for Referral: Objectively evaluate swallowing function Previous Swallow Assessment: none in chart Diet Prior to this Study: Regular;Thin liquids Respiratory Status: Room air History of Recent Intubation: No Behavior/Cognition: Alert;Cooperative;Pleasant mood Oral Cavity - Dentition: Adequate natural dentition Oral Motor / Sensory Function: Within functional limits Self-Feeding Abilities: Able to feed self Patient Positioning: Upright in chair Baseline Vocal Quality: Clear Volitional Swallow: Able to elicit Anatomy: Within functional limits Pharyngeal Secretions: Not observed secondary MBS    Reason for Referral Objectively evaluate swallowing function   Oral Phase Oral Preparation/Oral Phase Oral Phase: WFL   Pharyngeal Phase Pharyngeal Phase Pharyngeal Phase: Within functional limits (for  age)  Cervical Esophageal Phase    GO    Cervical Esophageal Phase Cervical Esophageal Phase: Impaired Cervical Esophageal Phase - Comment Cervical Esophageal Comment: barium retained in the thoracic esophagus per MD    Functional Assessment Tool Used: skilled clinical judgment Functional Limitations: Swallowing Swallow Current Status ([id number]): At least 1 percent but less than 20 percent impaired, limited or restricted Swallow Goal Status [phone]): At least 1 percent but less than 20 percent impaired, limited or restricted Swallow Discharge Status [phone]): At least 1 percent but less than 20 percent impaired, limited or restricted      Maxcine Ham, M.A. CCC-SLP [phone]  Maxcine Ham 07/09/2014, 12:03 PM

## 2016-01-06 DIAGNOSIS — I482 Chronic atrial fibrillation: Secondary | ICD-10-CM | POA: Diagnosis not present

## 2016-01-06 DIAGNOSIS — I10 Essential (primary) hypertension: Secondary | ICD-10-CM | POA: Diagnosis not present

## 2016-01-06 DIAGNOSIS — Z6826 Body mass index (BMI) 26.0-26.9, adult: Secondary | ICD-10-CM | POA: Diagnosis not present

## 2016-01-06 DIAGNOSIS — E119 Type 2 diabetes mellitus without complications: Secondary | ICD-10-CM | POA: Diagnosis not present

## 2016-01-06 DIAGNOSIS — Z7984 Long term (current) use of oral hypoglycemic drugs: Secondary | ICD-10-CM | POA: Diagnosis not present

## 2016-04-04 DIAGNOSIS — H2513 Age-related nuclear cataract, bilateral: Secondary | ICD-10-CM | POA: Diagnosis not present

## 2016-04-04 DIAGNOSIS — H353131 Nonexudative age-related macular degeneration, bilateral, early dry stage: Secondary | ICD-10-CM | POA: Diagnosis not present

## 2016-04-04 DIAGNOSIS — E119 Type 2 diabetes mellitus without complications: Secondary | ICD-10-CM | POA: Diagnosis not present

## 2016-05-19 ENCOUNTER — Other Ambulatory Visit: Payer: Self-pay | Admitting: Internal Medicine

## 2016-05-19 ENCOUNTER — Ambulatory Visit
Admission: RE | Admit: 2016-05-19 | Discharge: 2016-05-19 | Disposition: A | Discharge status: Home / Self Care | Payer: Medicare Other | Source: Ambulatory Visit | Attending: Internal Medicine | Admitting: Internal Medicine

## 2016-05-19 DIAGNOSIS — M5441 Lumbago with sciatica, right side: Secondary | ICD-10-CM

## 2016-05-19 DIAGNOSIS — M545 Low back pain: Secondary | ICD-10-CM | POA: Diagnosis not present

## 2016-07-11 DIAGNOSIS — Z1389 Encounter for screening for other disorder: Secondary | ICD-10-CM | POA: Diagnosis not present

## 2016-07-11 DIAGNOSIS — R1310 Dysphagia, unspecified: Secondary | ICD-10-CM | POA: Diagnosis not present

## 2016-07-11 DIAGNOSIS — E119 Type 2 diabetes mellitus without complications: Secondary | ICD-10-CM | POA: Diagnosis not present

## 2016-07-11 DIAGNOSIS — Z0000 Encounter for general adult medical examination without abnormal findings: Secondary | ICD-10-CM | POA: Diagnosis not present

## 2016-07-11 DIAGNOSIS — Z7984 Long term (current) use of oral hypoglycemic drugs: Secondary | ICD-10-CM | POA: Diagnosis not present

## 2016-07-11 DIAGNOSIS — M5431 Sciatica, right side: Secondary | ICD-10-CM | POA: Diagnosis not present

## 2016-07-11 DIAGNOSIS — I482 Chronic atrial fibrillation: Secondary | ICD-10-CM | POA: Diagnosis not present

## 2016-07-11 DIAGNOSIS — I10 Essential (primary) hypertension: Secondary | ICD-10-CM | POA: Diagnosis not present

## 2016-07-12 ENCOUNTER — Other Ambulatory Visit: Payer: Self-pay | Admitting: Internal Medicine

## 2016-07-12 DIAGNOSIS — M5431 Sciatica, right side: Secondary | ICD-10-CM

## 2016-07-12 DIAGNOSIS — R1310 Dysphagia, unspecified: Secondary | ICD-10-CM

## 2016-07-20 ENCOUNTER — Ambulatory Visit
Admission: RE | Admit: 2016-07-20 | Discharge: 2016-07-20 | Disposition: A | Discharge status: Home / Self Care | Payer: Medicare Other | Source: Ambulatory Visit | Attending: Internal Medicine | Admitting: Internal Medicine

## 2016-07-20 DIAGNOSIS — R1310 Dysphagia, unspecified: Secondary | ICD-10-CM

## 2016-07-20 DIAGNOSIS — M48061 Spinal stenosis, lumbar region without neurogenic claudication: Secondary | ICD-10-CM | POA: Diagnosis not present

## 2016-07-20 DIAGNOSIS — K219 Gastro-esophageal reflux disease without esophagitis: Secondary | ICD-10-CM | POA: Diagnosis not present

## 2016-07-20 DIAGNOSIS — M5431 Sciatica, right side: Secondary | ICD-10-CM

## 2016-08-14 DIAGNOSIS — K222 Esophageal obstruction: Secondary | ICD-10-CM | POA: Diagnosis not present

## 2016-08-15 ENCOUNTER — Other Ambulatory Visit: Payer: Self-pay | Admitting: Gastroenterology

## 2016-08-16 ENCOUNTER — Encounter (HOSPITAL_COMMUNITY): Payer: Self-pay | Admitting: *Deleted

## 2016-08-16 NOTE — Progress Notes (Signed)
Spoke with pt for pre-op call. Pt has hx of A-fib. Last dose of Xarelto was 08/13/16. Denies any recent chest pain or sob. Pt is diabetic, can't remember what her last A1C was. States fasting blood sugar usually runs around 165.   EKG - 2014 - in EPIC

## 2016-08-21 ENCOUNTER — Other Ambulatory Visit: Payer: Self-pay | Admitting: Gastroenterology

## 2016-08-23 NOTE — Progress Notes (Signed)
Pt stated that there were no changes since given pre-op instructions last week. Pt updated with new arrival time and date of Thursday, August 24, 2016 at 10:45 A.M ( pt stated that she was advised to arrive at 11:00 A.M. for 12:00 procedure) . Pt verbalized understanding of all pre-op instructions that were previously given; pt stated  "I have everything written down."

## 2016-08-24 ENCOUNTER — Encounter (HOSPITAL_COMMUNITY)
Admission: RE | Disposition: A | Discharge status: Home / Self Care | Payer: Self-pay | Source: Ambulatory Visit | Attending: Gastroenterology

## 2016-08-24 ENCOUNTER — Encounter (HOSPITAL_COMMUNITY): Payer: Self-pay | Admitting: Anesthesiology

## 2016-08-24 ENCOUNTER — Ambulatory Visit (HOSPITAL_COMMUNITY)
Admission: RE | Admit: 2016-08-24 | Discharge: 2016-08-24 | Disposition: A | Discharge status: Home / Self Care | Payer: Medicare Other | Source: Ambulatory Visit | Attending: Gastroenterology | Admitting: Gastroenterology

## 2016-08-24 ENCOUNTER — Encounter (HOSPITAL_COMMUNITY): Payer: Self-pay | Admitting: *Deleted

## 2016-08-24 DIAGNOSIS — K449 Diaphragmatic hernia without obstruction or gangrene: Secondary | ICD-10-CM | POA: Insufficient documentation

## 2016-08-24 DIAGNOSIS — K222 Esophageal obstruction: Secondary | ICD-10-CM | POA: Insufficient documentation

## 2016-08-24 DIAGNOSIS — I482 Chronic atrial fibrillation: Secondary | ICD-10-CM | POA: Diagnosis not present

## 2016-08-24 DIAGNOSIS — Z7984 Long term (current) use of oral hypoglycemic drugs: Secondary | ICD-10-CM | POA: Diagnosis not present

## 2016-08-24 DIAGNOSIS — R1310 Dysphagia, unspecified: Secondary | ICD-10-CM | POA: Insufficient documentation

## 2016-08-24 DIAGNOSIS — Z87891 Personal history of nicotine dependence: Secondary | ICD-10-CM | POA: Diagnosis not present

## 2016-08-24 DIAGNOSIS — I10 Essential (primary) hypertension: Secondary | ICD-10-CM | POA: Diagnosis not present

## 2016-08-24 DIAGNOSIS — Z7901 Long term (current) use of anticoagulants: Secondary | ICD-10-CM | POA: Diagnosis not present

## 2016-08-24 DIAGNOSIS — I2510 Atherosclerotic heart disease of native coronary artery without angina pectoris: Secondary | ICD-10-CM | POA: Diagnosis not present

## 2016-08-24 DIAGNOSIS — Z79899 Other long term (current) drug therapy: Secondary | ICD-10-CM | POA: Diagnosis not present

## 2016-08-24 DIAGNOSIS — I4891 Unspecified atrial fibrillation: Secondary | ICD-10-CM | POA: Diagnosis not present

## 2016-08-24 HISTORY — PX: ESOPHAGOGASTRODUODENOSCOPY (EGD) WITH PROPOFOL: SHX5813

## 2016-08-24 HISTORY — PX: SAVORY DILATION: SHX5439

## 2016-08-24 SURGERY — ESOPHAGOGASTRODUODENOSCOPY (EGD) WITH PROPOFOL
Anesthesia: Monitor Anesthesia Care

## 2016-08-24 MED ORDER — PROPOFOL 10 MG/ML IV BOLUS
INTRAVENOUS | Status: DC | PRN
  Administered 2016-08-24: 20 mg via INTRAVENOUS
  Administered 2016-08-24: 50 mg via INTRAVENOUS
  Administered 2016-08-24: 40 mg via INTRAVENOUS
  Administered 2016-08-24: 20 mg via INTRAVENOUS
  Administered 2016-08-24: 30 mg via INTRAVENOUS

## 2016-08-24 MED ORDER — SODIUM CHLORIDE 0.9 % IJ SOLN
100.0000 [IU] | Freq: Once | INTRAMUSCULAR | Status: DC
  Filled 2016-08-24: qty 100

## 2016-08-24 MED ORDER — SODIUM CHLORIDE 0.9 % IV SOLN
INTRAVENOUS | Status: DC | PRN
Start: 2016-08-24 — End: 2016-08-24
  Administered 2016-08-24: 12:00:00 via INTRAVENOUS

## 2016-08-24 MED ORDER — LACTATED RINGERS IV SOLN: INTRAVENOUS | Status: DC

## 2016-08-24 MED ORDER — PROPOFOL 500 MG/50ML IV EMUL: INTRAVENOUS | Status: DC | PRN

## 2016-08-24 NOTE — Discharge Instructions (Signed)

## 2016-08-24 NOTE — Op Note (Signed)
[facility] Patient Name: Jasmine Jenkins Procedure Date : 08/24/2016 MRN: [id number] Attending MD: Barrie Folk , MD Date of Birth: 1925/10/17 CSN: [id number] Age: 83 Admit Type: Outpatient Procedure:                Upper GI endoscopy Indications:              Dysphagia Providers:                Everardo All. Madilyn Fireman, MDKatie Charmian Muff, RN, , Lorenda Ishihara, Technician, Carmela Rima CRNA, CRNA Referring MD:              Medicines:                Propofol per Anesthesia Complications:            No immediate complications. Estimated Blood Loss:     Estimated blood loss: none. Procedure:                Pre-Anesthesia Assessment:                           - Prior to the procedure, a History and Physical                            was performed, and patient medications and                            allergies were reviewed. The patient's tolerance of                            previous anesthesia was also reviewed. The risks                            and benefits of the procedure and the sedation                            options and risks were discussed with the patient.                            All questions were answered, and informed consent                            was obtained. Prior Anticoagulants: The patient has                            taken Xarelto (rivaroxaban), last dose was 3 days                            prior to procedure. ASA Grade Assessment: II - A                            patient with mild systemic disease. After reviewing  the risks and benefits, the patient was deemed in                            satisfactory condition to undergo the procedure.                           After obtaining informed consent, the endoscope was                            passed under direct vision. Throughout the                            procedure, the patient's blood pressure, pulse, and      oxygen saturations were monitored continuously. The                            EG-[id number] ([id number]) scope was introduced through the                            mouth, and advanced to the second part of duodenum.                            The upper GI endoscopy was accomplished without                            difficulty. The patient tolerated the procedure                            well. Scope In: Scope Out: Findings:      A moderate Schatzki ring (acquired) was found at the gastroesophageal       junction. A guidewire was placed and the scope was withdrawn. Dilation       was performed with a Savary dilator with mild resistance at 16 mm.       Estimated blood loss was minimal.      A 4 cm hiatal hernia was present.      The examined duodenum was normal. Impression:               - Moderate Schatzki ring. Dilated.                           - 4 cm hiatal hernia.                           - Normal examined duodenum.                           - No specimens collected. Moderate Sedation:      no moderate sedation Recommendation:           - Discharge patient to home (ambulatory).                           - Mechanical soft diet indefinitely.                           -  Return to my office in 4 weeks.                           - Continue present medications. Procedure Code(s):        --- Professional ---                           [phone], Esophagogastroduodenoscopy, flexible,                            transoral; with insertion of guide wire followed by                            passage of dilator(s) through esophagus over guide                            wire Diagnosis Code(s):        --- Professional ---                           K22.2, Esophageal obstruction                           K44.9, Diaphragmatic hernia without obstruction or                            gangrene                           R13.10, Dysphagia, unspecified CPT copyright 2016 [employer]. All  rights reserved. The codes documented in this report are preliminary and upon coder review may  be revised to meet current compliance requirements. Barrie Folk, MD 08/24/2016 1:03:09 PM This report has been signed electronically. Number of Addenda: 0

## 2016-08-24 NOTE — Transfer of Care (Signed)
Immediate Anesthesia Transfer of Care Note  Patient: Neveah M Sturges  Procedure(s) Performed: Procedure(s): ESOPHAGOGASTRODUODENOSCOPY (EGD) WITH PROPOFOL (N/A) BOTOX INJECTION (N/A) BALLOON DILATION (N/A)  Patient Location: Endoscopy Unit  Anesthesia Type:MAC  Level of Consciousness: awake, alert  and oriented  Airway & Oxygen Therapy: Patient Spontanous Breathing and Patient connected to nasal cannula oxygen  Post-op Assessment: Report given to RN, Post -op Vital signs reviewed and stable and Patient moving all extremities X 4  Post vital signs: Reviewed and stable  Last Vitals:  Vitals:   08/24/16 1058  BP: (!) 166/70  Pulse: (!) 112  Resp: (!) 28  Temp: 36.6 C    Last Pain:  Vitals:   08/24/16 1058  TempSrc: Oral         Complications: No apparent anesthesia complications

## 2016-08-24 NOTE — Anesthesia Preprocedure Evaluation (Addendum)
Anesthesia Evaluation  Patient identified by MRN, date of birth, ID band Patient awake    Reviewed: Allergy & Precautions, H&P , NPO status , Patient's Chart, lab work & pertinent test results  History of Anesthesia Complications (+) PONV and history of anesthetic complications  Airway Mallampati: II  TM Distance: >3 FB Neck ROM: Full    Dental no notable dental hx. (+) Teeth Intact, Dental Advisory Given   Pulmonary neg pulmonary ROS, shortness of breath and with exertion, former smoker,    Pulmonary exam normal breath sounds clear to auscultation       Cardiovascular hypertension, Pt. on medications and Pt. on home beta blockers + CAD and + Peripheral Vascular Disease  negative cardio ROS  + dysrhythmias Atrial Fibrillation  Rhythm:Irregular Rate:Normal     Neuro/Psych negative neurological ROS  negative psych ROS   GI/Hepatic negative GI ROS, Neg liver ROS, hiatal hernia, GERD  Medicated and Controlled,  Endo/Other  negative endocrine ROSdiabetes  Renal/GU Renal InsufficiencyRenal diseasenegative Renal ROS  negative genitourinary   Musculoskeletal  (+) Arthritis , Osteoarthritis,    Abdominal   Peds  Hematology negative hematology ROS (+)   Anesthesia Other Findings   Reproductive/Obstetrics negative OB ROS                           Anesthesia Physical Anesthesia Plan  ASA: III  Anesthesia Plan: MAC   Post-op Pain Management:    Induction: Intravenous  Airway Management Planned: Nasal Cannula  Additional Equipment:   Intra-op Plan:   Post-operative Plan:   Informed Consent: I have reviewed the patients History and Physical, chart, labs and discussed the procedure including the risks, benefits and alternatives for the proposed anesthesia with the patient or authorized representative who has indicated his/her understanding and acceptance.   Dental advisory given and Dental  Advisory Given  Plan Discussed with: CRNA and Anesthesiologist  Anesthesia Plan Comments:        Anesthesia Quick Evaluation

## 2016-08-24 NOTE — Anesthesia Postprocedure Evaluation (Signed)
Anesthesia Post Note  Patient: Jasmine Jenkins  Procedure(s) Performed: Procedure(s) (LRB): ESOPHAGOGASTRODUODENOSCOPY (EGD) WITH PROPOFOL (N/A) BALLOON DILATION (N/A)  Patient location during evaluation: PACU Anesthesia Type: MAC Level of consciousness: awake and alert Pain management: pain level controlled Vital Signs Assessment: post-procedure vital signs reviewed and stable Respiratory status: spontaneous breathing, nonlabored ventilation, respiratory function stable and patient connected to nasal cannula oxygen Cardiovascular status: stable and blood pressure returned to baseline Anesthetic complications: no       Last Vitals:  Vitals:   08/24/16 1320 08/24/16 1330  BP: (!) 159/81 (!) 179/89  Pulse: 86 81  Resp: (!) 27 (!) 21  Temp:      Last Pain:  Vitals:   08/24/16 1301  TempSrc: Oral                 Philipp Callegari DAVID

## 2016-08-25 ENCOUNTER — Encounter (HOSPITAL_COMMUNITY): Payer: Self-pay | Admitting: Gastroenterology

## 2016-09-15 DIAGNOSIS — M5431 Sciatica, right side: Secondary | ICD-10-CM | POA: Diagnosis not present

## 2016-09-15 DIAGNOSIS — M4125 Other idiopathic scoliosis, thoracolumbar region: Secondary | ICD-10-CM | POA: Diagnosis not present

## 2016-09-15 DIAGNOSIS — I10 Essential (primary) hypertension: Secondary | ICD-10-CM | POA: Diagnosis not present

## 2016-10-02 DIAGNOSIS — K222 Esophageal obstruction: Secondary | ICD-10-CM | POA: Diagnosis not present

## 2016-10-02 DIAGNOSIS — R1310 Dysphagia, unspecified: Secondary | ICD-10-CM | POA: Diagnosis not present

## 2016-10-19 DIAGNOSIS — R1310 Dysphagia, unspecified: Secondary | ICD-10-CM | POA: Diagnosis not present

## 2016-10-19 DIAGNOSIS — K228 Other specified diseases of esophagus: Secondary | ICD-10-CM | POA: Diagnosis not present

## 2017-01-05 DIAGNOSIS — E119 Type 2 diabetes mellitus without complications: Secondary | ICD-10-CM | POA: Diagnosis not present

## 2017-01-05 DIAGNOSIS — R609 Edema, unspecified: Secondary | ICD-10-CM | POA: Diagnosis not present

## 2017-01-05 DIAGNOSIS — I10 Essential (primary) hypertension: Secondary | ICD-10-CM | POA: Diagnosis not present

## 2017-01-05 DIAGNOSIS — I482 Chronic atrial fibrillation: Secondary | ICD-10-CM | POA: Diagnosis not present

## 2017-04-10 DIAGNOSIS — H2513 Age-related nuclear cataract, bilateral: Secondary | ICD-10-CM | POA: Diagnosis not present

## 2017-04-10 DIAGNOSIS — E119 Type 2 diabetes mellitus without complications: Secondary | ICD-10-CM | POA: Diagnosis not present

## 2017-04-10 DIAGNOSIS — H353131 Nonexudative age-related macular degeneration, bilateral, early dry stage: Secondary | ICD-10-CM | POA: Diagnosis not present

## 2017-07-17 DIAGNOSIS — I10 Essential (primary) hypertension: Secondary | ICD-10-CM | POA: Diagnosis not present

## 2017-07-17 DIAGNOSIS — I482 Chronic atrial fibrillation: Secondary | ICD-10-CM | POA: Diagnosis not present

## 2017-07-17 DIAGNOSIS — E119 Type 2 diabetes mellitus without complications: Secondary | ICD-10-CM | POA: Diagnosis not present

## 2017-07-17 DIAGNOSIS — M4696 Unspecified inflammatory spondylopathy, lumbar region: Secondary | ICD-10-CM | POA: Diagnosis not present

## 2017-08-17 ENCOUNTER — Other Ambulatory Visit: Payer: Self-pay | Admitting: Gastroenterology

## 2017-08-17 DIAGNOSIS — R1310 Dysphagia, unspecified: Secondary | ICD-10-CM

## 2017-08-22 ENCOUNTER — Ambulatory Visit
Admission: RE | Admit: 2017-08-22 | Discharge: 2017-08-22 | Disposition: A | Discharge status: Home / Self Care | Payer: Medicare Other | Source: Ambulatory Visit | Attending: Gastroenterology | Admitting: Gastroenterology

## 2017-08-22 DIAGNOSIS — R1310 Dysphagia, unspecified: Secondary | ICD-10-CM

## 2017-08-22 DIAGNOSIS — K449 Diaphragmatic hernia without obstruction or gangrene: Secondary | ICD-10-CM | POA: Diagnosis not present

## 2017-08-24 ENCOUNTER — Other Ambulatory Visit: Payer: Self-pay | Admitting: Gastroenterology

## 2017-08-28 ENCOUNTER — Other Ambulatory Visit: Payer: Self-pay | Admitting: Gastroenterology

## 2017-08-29 ENCOUNTER — Encounter (HOSPITAL_COMMUNITY): Payer: Self-pay | Admitting: *Deleted

## 2017-08-29 ENCOUNTER — Other Ambulatory Visit: Payer: Self-pay

## 2017-08-29 NOTE — Progress Notes (Signed)
Pt denies any acute cardiopulmonary issues. Pt made aware to not take glimepiride (AMARYL) tonight or tomorrow morning. Pt made aware to check BG every 2 hours prior to arrival to hospital on DOS. Pt made aware to treat a BG < 70 with  4 ounces of apple  juice, wait 15 minutes after intervention to recheck BG, if BG remains < 70, call Endoscopy Unit to speak with a nurse. Pt stated that her last dose of Xarelto was Monday as instructed. Pt made aware to stop taking vitamins, fish oil and herbal medications. Do not take any NSAIDs ie: Ibuprofen, Advil, Naproxen (Aleve), Motrin, BC and Goody Powder. Pt verbalized understanding of all pre-op instructions.

## 2017-08-30 ENCOUNTER — Ambulatory Visit (HOSPITAL_COMMUNITY): Payer: Medicare Other | Admitting: Anesthesiology

## 2017-08-30 ENCOUNTER — Ambulatory Visit (HOSPITAL_COMMUNITY)
Admission: RE | Admit: 2017-08-30 | Discharge: 2017-08-30 | Disposition: A | Discharge status: Home / Self Care | Payer: Medicare Other | Source: Ambulatory Visit | Attending: Gastroenterology | Admitting: Gastroenterology

## 2017-08-30 ENCOUNTER — Encounter (HOSPITAL_COMMUNITY)
Admission: RE | Disposition: A | Discharge status: Home / Self Care | Payer: Self-pay | Source: Ambulatory Visit | Attending: Gastroenterology

## 2017-08-30 ENCOUNTER — Encounter (HOSPITAL_COMMUNITY): Payer: Self-pay | Admitting: Anesthesiology

## 2017-08-30 ENCOUNTER — Other Ambulatory Visit: Payer: Self-pay

## 2017-08-30 DIAGNOSIS — Z880 Allergy status to penicillin: Secondary | ICD-10-CM | POA: Insufficient documentation

## 2017-08-30 DIAGNOSIS — Z79899 Other long term (current) drug therapy: Secondary | ICD-10-CM | POA: Insufficient documentation

## 2017-08-30 DIAGNOSIS — M549 Dorsalgia, unspecified: Secondary | ICD-10-CM | POA: Diagnosis not present

## 2017-08-30 DIAGNOSIS — Z87891 Personal history of nicotine dependence: Secondary | ICD-10-CM | POA: Diagnosis not present

## 2017-08-30 DIAGNOSIS — Z5309 Procedure and treatment not carried out because of other contraindication: Secondary | ICD-10-CM | POA: Insufficient documentation

## 2017-08-30 DIAGNOSIS — G8929 Other chronic pain: Secondary | ICD-10-CM | POA: Diagnosis not present

## 2017-08-30 DIAGNOSIS — M159 Polyosteoarthritis, unspecified: Secondary | ICD-10-CM | POA: Diagnosis not present

## 2017-08-30 DIAGNOSIS — M810 Age-related osteoporosis without current pathological fracture: Secondary | ICD-10-CM | POA: Diagnosis not present

## 2017-08-30 DIAGNOSIS — I482 Chronic atrial fibrillation: Secondary | ICD-10-CM | POA: Insufficient documentation

## 2017-08-30 DIAGNOSIS — K222 Esophageal obstruction: Secondary | ICD-10-CM

## 2017-08-30 DIAGNOSIS — R1310 Dysphagia, unspecified: Secondary | ICD-10-CM

## 2017-08-30 DIAGNOSIS — Z7901 Long term (current) use of anticoagulants: Secondary | ICD-10-CM | POA: Diagnosis not present

## 2017-08-30 DIAGNOSIS — E119 Type 2 diabetes mellitus without complications: Secondary | ICD-10-CM | POA: Insufficient documentation

## 2017-08-30 DIAGNOSIS — I10 Essential (primary) hypertension: Secondary | ICD-10-CM | POA: Diagnosis not present

## 2017-08-30 DIAGNOSIS — Z7984 Long term (current) use of oral hypoglycemic drugs: Secondary | ICD-10-CM | POA: Diagnosis not present

## 2017-08-30 DIAGNOSIS — Z888 Allergy status to other drugs, medicaments and biological substances status: Secondary | ICD-10-CM | POA: Insufficient documentation

## 2017-08-30 SURGERY — CANCELLED PROCEDURE

## 2017-08-30 MED ORDER — LACTATED RINGERS IV SOLN
INTRAVENOUS | Status: DC
  Administered 2017-08-30: 08:00:00 via INTRAVENOUS

## 2017-08-30 MED ORDER — SODIUM CHLORIDE 0.9 % IV SOLN: INTRAVENOUS | Status: DC

## 2017-08-30 SURGICAL SUPPLY — 14 items

## 2017-08-30 NOTE — Anesthesia Preprocedure Evaluation (Addendum)
Anesthesia Evaluation  Patient identified by MRN, date of birth, ID band  History of Anesthesia Complications (+) PONV and history of anesthetic complications  Airway        Dental   Pulmonary former smoker,           Cardiovascular hypertension, Pt. on medications and Pt. on home beta blockers + CAD and + Peripheral Vascular Disease  + dysrhythmias Atrial Fibrillation      Neuro/Psych negative neurological ROS     GI/Hepatic hiatal hernia, GERD  ,  Endo/Other  diabetes, Type 2, Oral Hypoglycemic Agents  Renal/GU CRFRenal disease     Musculoskeletal   Abdominal   Peds  Hematology   Anesthesia Other Findings   Reproductive/Obstetrics                            Anesthesia Physical Anesthesia Plan  ASA: II  Anesthesia Plan: MAC   Post-op Pain Management:    Induction: Intravenous  PONV Risk Score and Plan: Propofol infusion and Ondansetron  Airway Management Planned: Nasal Cannula and Natural Airway  Additional Equipment: None  Intra-op Plan:   Post-operative Plan:   Informed Consent: I have reviewed the patients History and Physical, chart, labs and discussed the procedure including the risks, benefits and alternatives for the proposed anesthesia with the patient or authorized representative who has indicated his/her understanding and acceptance.   Dental advisory given  Plan Discussed with: CRNA  Anesthesia Plan Comments: (Cancelled due to uncontrolled HTN 200's/120-140's  Pt calling PCP for urgent appt., prefers not to go to ED. )       Anesthesia Quick Evaluation

## 2017-08-30 NOTE — Interval H&P Note (Signed)
History and Physical Interval Note:  08/30/2017 8:58 AM  Jasmine Jenkins  has presented today for surgery, with the diagnosis of dysphagia/stricture  The various methods of treatment have been discussed with the patient and family. After consideration of risks, benefits and other options for treatment, the patient has consented to  Procedure(s) with comments: ESOPHAGOGASTRODUODENOSCOPY (EGD) WITH PROPOFOL (N/A) - fluoro SAVORY DILATION vs balloon (N/A) as a surgical intervention .  The patient's history has been reviewed, patient examined, no change in status, stable for surgery.  I have reviewed the patient's chart and labs.  Questions were answered to the patient's satisfaction.     Melessa Cowell C.

## 2017-08-30 NOTE — H&P (Signed)
Date of Initial H&P: 06/07/18  History reviewed, patient examined, no change in status, stable for surgery.  

## 2017-08-30 NOTE — Progress Notes (Signed)
Pt has hx of afib and high blood pressure.  Did not take losartan today.  HR and BP elevated notified anesthesia MD and GI MD.  Dr. Hart RochesterHollis in to evaluate.  Manual BP taken.  Per Dr. Hart RochesterHollis case cancelled.  Pt's son scheduled appointment today at 1145 am with primary PCP Dr. Valentina LucksGriffin per Dr. Hart RochesterHollis recommendation.  Pt discharged home per MD

## 2017-09-12 ENCOUNTER — Other Ambulatory Visit: Payer: Self-pay | Admitting: Gastroenterology

## 2017-09-25 ENCOUNTER — Other Ambulatory Visit: Payer: Self-pay | Admitting: Gastroenterology

## 2017-09-26 ENCOUNTER — Encounter (HOSPITAL_COMMUNITY): Payer: Self-pay | Admitting: *Deleted

## 2017-09-26 ENCOUNTER — Other Ambulatory Visit: Payer: Self-pay

## 2017-09-26 NOTE — Progress Notes (Signed)
Pt denies any acute cardiopulmonary issues. Pt made aware to not take glimepiride (AMARYL) tonight or tomorrow morning. Pt made aware to check BG every 2 hours prior to arrival to hospital on DOS. Pt made aware to treat a BG < 70 with  4 ounces of apple  juice, wait 15 minutes after intervention to recheck BG, if BG remains < 70, call Endoscopy Unit to speak with a nurse. Pt stated that she stopped her Xarelto  as instructed. Pt stated that she was instructed to take her Losartan on the morning of procedure. Pt made aware to stop taking vitamins, fish oil and herbal medications. Do not take any NSAIDs ie: Ibuprofen, Advil, Naproxen (Aleve), Motrin, BC and Goody Powder. Pt verbalized understanding of all pre-op instructions.

## 2017-09-27 ENCOUNTER — Ambulatory Visit (HOSPITAL_COMMUNITY): Payer: Medicare Other | Admitting: Anesthesiology

## 2017-09-27 ENCOUNTER — Encounter (HOSPITAL_COMMUNITY): Payer: Self-pay | Admitting: *Deleted

## 2017-09-27 ENCOUNTER — Ambulatory Visit (HOSPITAL_COMMUNITY): Payer: Medicare Other

## 2017-09-27 ENCOUNTER — Other Ambulatory Visit: Payer: Self-pay

## 2017-09-27 ENCOUNTER — Encounter (HOSPITAL_COMMUNITY)
Admission: RE | Disposition: A | Discharge status: Home / Self Care | Payer: Self-pay | Source: Ambulatory Visit | Attending: Gastroenterology

## 2017-09-27 ENCOUNTER — Ambulatory Visit (HOSPITAL_COMMUNITY)
Admission: RE | Admit: 2017-09-27 | Discharge: 2017-09-27 | Disposition: A | Discharge status: Home / Self Care | Payer: Medicare Other | Source: Ambulatory Visit | Attending: Gastroenterology | Admitting: Gastroenterology

## 2017-09-27 DIAGNOSIS — R1310 Dysphagia, unspecified: Secondary | ICD-10-CM

## 2017-09-27 DIAGNOSIS — Z87891 Personal history of nicotine dependence: Secondary | ICD-10-CM | POA: Insufficient documentation

## 2017-09-27 DIAGNOSIS — Z885 Allergy status to narcotic agent status: Secondary | ICD-10-CM | POA: Insufficient documentation

## 2017-09-27 DIAGNOSIS — I2510 Atherosclerotic heart disease of native coronary artery without angina pectoris: Secondary | ICD-10-CM | POA: Insufficient documentation

## 2017-09-27 DIAGNOSIS — K449 Diaphragmatic hernia without obstruction or gangrene: Secondary | ICD-10-CM | POA: Diagnosis not present

## 2017-09-27 DIAGNOSIS — Z880 Allergy status to penicillin: Secondary | ICD-10-CM | POA: Diagnosis not present

## 2017-09-27 DIAGNOSIS — K222 Esophageal obstruction: Secondary | ICD-10-CM | POA: Diagnosis not present

## 2017-09-27 DIAGNOSIS — I4891 Unspecified atrial fibrillation: Secondary | ICD-10-CM | POA: Insufficient documentation

## 2017-09-27 DIAGNOSIS — I10 Essential (primary) hypertension: Secondary | ICD-10-CM | POA: Diagnosis not present

## 2017-09-27 DIAGNOSIS — E119 Type 2 diabetes mellitus without complications: Secondary | ICD-10-CM | POA: Diagnosis not present

## 2017-09-27 DIAGNOSIS — Z7901 Long term (current) use of anticoagulants: Secondary | ICD-10-CM | POA: Diagnosis not present

## 2017-09-27 DIAGNOSIS — Z7984 Long term (current) use of oral hypoglycemic drugs: Secondary | ICD-10-CM | POA: Insufficient documentation

## 2017-09-27 DIAGNOSIS — Z79899 Other long term (current) drug therapy: Secondary | ICD-10-CM | POA: Diagnosis not present

## 2017-09-27 HISTORY — PX: ESOPHAGOGASTRODUODENOSCOPY (EGD) WITH PROPOFOL: SHX5813

## 2017-09-27 HISTORY — PX: BALLOON DILATION: SHX5330

## 2017-09-27 HISTORY — PX: SAVORY DILATION: SHX5439

## 2017-09-27 LAB — GLUCOSE, CAPILLARY: Glucose-Capillary: 154 mg/dL — ABNORMAL HIGH (ref 65–99)

## 2017-09-27 SURGERY — ESOPHAGOGASTRODUODENOSCOPY (EGD) WITH PROPOFOL
Anesthesia: Monitor Anesthesia Care

## 2017-09-27 MED ORDER — LACTATED RINGERS IV SOLN
INTRAVENOUS | Status: AC | PRN
  Administered 2017-09-27: 1000 mL via INTRAVENOUS
  Administered 2017-09-27: 11:00:00 via INTRAVENOUS

## 2017-09-27 MED ORDER — PROPOFOL 10 MG/ML IV BOLUS
INTRAVENOUS | Status: DC | PRN
  Administered 2017-09-27: 20 mg via INTRAVENOUS
  Administered 2017-09-27: 30 mg via INTRAVENOUS
  Administered 2017-09-27: 10 mg via INTRAVENOUS
  Administered 2017-09-27: 20 mg via INTRAVENOUS

## 2017-09-27 MED ORDER — SODIUM CHLORIDE 0.9 % IV SOLN: INTRAVENOUS | Status: DC

## 2017-09-27 MED ORDER — ONDANSETRON HCL 4 MG/2ML IJ SOLN
INTRAMUSCULAR | Status: DC | PRN
  Administered 2017-09-27: 4 mg via INTRAVENOUS

## 2017-09-27 MED ORDER — PROPOFOL 500 MG/50ML IV EMUL
INTRAVENOUS | Status: DC | PRN
  Administered 2017-09-27: 55 ug/kg/min via INTRAVENOUS

## 2017-09-27 SURGICAL SUPPLY — 14 items

## 2017-09-27 NOTE — Interval H&P Note (Signed)
History and Physical Interval Note:  09/27/2017 10:34 AM  Jasmine Jenkins  has presented today for surgery, with the diagnosis of Dysphagia  The various methods of treatment have been discussed with the patient and family. After consideration of risks, benefits and other options for treatment, the patient has consented to  Procedure(s): ESOPHAGOGASTRODUODENOSCOPY (EGD) WITH PROPOFOL (N/A) SAVORY DILATION vs balloon with fluoroscopy (N/A) as a surgical intervention .  The patient's history has been reviewed, patient examined, no change in status, stable for surgery.  I have reviewed the patient's chart and labs.  Questions were answered to the patient's satisfaction.     [name] C.

## 2017-09-27 NOTE — H&P (Signed)
Date of Initial H&P: 09/25/17  History reviewed, patient examined, no change in status, stable for surgery.  

## 2017-09-27 NOTE — Op Note (Signed)
[facility] Patient Name: Jasmine Jenkins Procedure Date : 09/27/2017 MRN: [id number] Attending MD: Lear Ng , MD Date of Birth: [birth year] CSN: [id number] Age: 84 Admit Type: Outpatient Procedure:                Upper GI endoscopy Indications:              Dysphagia, Stricture of the esophagus Providers:                Lear Ng, MD, Angus Seller, Cletis Athens, Technician Referring MD:             Lavone Orn Medicines:                Propofol per Anesthesia, Monitored Anesthesia Care Complications:            No immediate complications. Estimated Blood Loss:     Estimated blood loss was minimal. Procedure:                Pre-Anesthesia Assessment:                           - Prior to the procedure, a History and Physical                            was performed, and patient medications and                            allergies were reviewed. The patient's tolerance of                            previous anesthesia was also reviewed. The risks                            and benefits of the procedure and the sedation                            options and risks were discussed with the patient.                            All questions were answered, and informed consent                            was obtained. Prior Anticoagulants: The patient has                            taken Xarelto (rivaroxaban), last dose was 2 days                            prior to procedure. ASA Grade Assessment: III - A                            patient with severe systemic disease. After  reviewing the risks and benefits, the patient was                            deemed in satisfactory condition to undergo the                            procedure.                           After obtaining informed consent, the endoscope was                            passed under direct vision. Throughout the   procedure, the patient's blood pressure, pulse, and                            oxygen saturations were monitored continuously. The                            EG-[id number] ([id number]) scope was introduced through the                            mouth, and advanced to the second part of duodenum.                            The upper GI endoscopy was accomplished without                            difficulty. The patient tolerated the procedure                            well. Scope In: Scope Out: Findings:      One moderate (circumferential scarring or stenosis; an endoscope may       pass) benign-appearing, intrinsic stenosis was found 36 cm from the       incisors. And was traversed. A TTS dilator was passed through the scope.       Dilation with a 15-16.5-18 mm balloon and an 18-19-20 mm balloon dilator       was performed to 19 mm under fluoroscopic guidance. The dilation site       was examined and showed mild improvement in luminal narrowing. Estimated       blood loss was minimal. A guidewire was placed under fluoroscopic       guidance and the scope was withdrawn. Dilation was performed with a       Savary dilator with no resistance at 16 mm and 17 mm and mild resistance       at 18 mm. Estimated blood loss: none.      A medium-sized hiatal hernia was present.      The exam of the stomach was otherwise normal.      The examined duodenum was normal. Impression:               - Benign-appearing esophageal stenosis. Dilated.                           - Medium-sized hiatal hernia.                           -  Normal examined duodenum.                           - No specimens collected. Moderate Sedation:      N/A - MAC procedure Recommendation:           - Patient has a contact number available for                            emergencies. The signs and symptoms of potential                            delayed complications were discussed with the                            patient. Return to  normal activities tomorrow.                            Written discharge instructions were provided to the                            patient.                           - Return to GI office in 1 month.                           - Resume Xarelto (rivaroxaban) at prior dose today.                           - Resume previous diet. Procedure Code(s):        --- Professional ---                           [phone], Esophagogastroduodenoscopy, flexible,                            transoral; with insertion of guide wire followed by                            passage of dilator(s) through esophagus over guide                            wire                           [id number], Esophagogastroduodenoscopy, flexible,                            transoral; with transendoscopic balloon dilation of                            esophagus (less than 30 mm diameter) Diagnosis Code(s):        --- Professional ---                           R13.10, Dysphagia, unspecified  K22.2, Esophageal obstruction                           K44.9, Diaphragmatic hernia without obstruction or                            gangrene CPT copyright 2016 [employer]. All rights reserved. The codes documented in this report are preliminary and upon coder review may  be revised to meet current compliance requirements. Lear Ng, MD 09/27/2017 11:49:05 AM This report has been signed electronically. Number of Addenda: 0

## 2017-09-27 NOTE — Anesthesia Preprocedure Evaluation (Signed)
Anesthesia Evaluation  Patient identified by MRN, date of birth, ID band Patient awake    Reviewed: Allergy & Precautions, NPO status , Patient's Chart, lab work & pertinent test results  Airway Mallampati: II  TM Distance: >3 FB Neck ROM: Full    Dental no notable dental hx.    Pulmonary neg pulmonary ROS, former smoker,    Pulmonary exam normal breath sounds clear to auscultation       Cardiovascular hypertension, + CAD  negative cardio ROS Normal cardiovascular exam+ dysrhythmias Atrial Fibrillation  Rhythm:Regular Rate:Normal     Neuro/Psych negative neurological ROS  negative psych ROS   GI/Hepatic Neg liver ROS,   Endo/Other  diabetes  Renal/GU   negative genitourinary   Musculoskeletal negative musculoskeletal ROS (+)   Abdominal   Peds  Hematology negative hematology ROS (+)   Anesthesia Other Findings   Reproductive/Obstetrics negative OB ROS                             Anesthesia Physical Anesthesia Plan  ASA: III  Anesthesia Plan: MAC   Post-op Pain Management:    Induction: Intravenous  PONV Risk Score and Plan: Treatment may vary due to age or medical condition  Airway Management Planned: Mask and Natural Airway  Additional Equipment:   Intra-op Plan:   Post-operative Plan:   Informed Consent: I have reviewed the patients History and Physical, chart, labs and discussed the procedure including the risks, benefits and alternatives for the proposed anesthesia with the patient or authorized representative who has indicated his/her understanding and acceptance.     Plan Discussed with: CRNA  Anesthesia Plan Comments:         Anesthesia Quick Evaluation

## 2017-09-27 NOTE — Anesthesia Postprocedure Evaluation (Signed)
Anesthesia Post Note  Patient: Jasmine Jenkins  Procedure(s) Performed: ESOPHAGOGASTRODUODENOSCOPY (EGD) WITH PROPOFOL (N/A ) SAVORY DILATION (N/A ) BALLOON DILATION (N/A )     Patient location during evaluation: Endoscopy Anesthesia Type: MAC Level of consciousness: awake and alert Pain management: pain level controlled Vital Signs Assessment: post-procedure vital signs reviewed and stable Respiratory status: spontaneous breathing, nonlabored ventilation, respiratory function stable and patient connected to nasal cannula oxygen Cardiovascular status: stable and blood pressure returned to baseline Postop Assessment: no apparent nausea or vomiting Anesthetic complications: no    Last Vitals:  Vitals:   09/27/17 1146 09/27/17 1200  BP: 111/72 136/66  Pulse: 91 (!) 139  Resp: (!) 24 20  Temp: 36.6 C   SpO2: 100% 100%    Last Pain:  Vitals:   09/27/17 1146  TempSrc: Oral                 Barnet Glasgow

## 2017-09-27 NOTE — Transfer of Care (Signed)
Immediate Anesthesia Transfer of Care Note  Patient: Jasmine Jenkins  Procedure(s) Performed: ESOPHAGOGASTRODUODENOSCOPY (EGD) WITH PROPOFOL (N/A ) SAVORY DILATION vs balloon with fluoroscopy (N/A )  Patient Location: Endoscopy Unit  Anesthesia Type:MAC  Level of Consciousness: awake, alert  and oriented  Airway & Oxygen Therapy: Patient Spontanous Breathing and Patient connected to nasal cannula oxygen  Post-op Assessment: Report given to RN and Post -op Vital signs reviewed and stable  Post vital signs: Reviewed and stable  Last Vitals:  Vitals:   09/27/17 0915  BP: (!) 155/86  Pulse: (!) 118  Resp: (!) 25  Temp: 36.5 C  SpO2: 99%    Last Pain:  Vitals:   09/27/17 0915  TempSrc: Oral         Complications: No apparent anesthesia complications

## 2017-09-27 NOTE — Anesthesia Procedure Notes (Signed)
Procedure Name: MAC Date/Time: 09/27/2017 10:52 AM Performed by: Leonor Liv, CRNA Pre-anesthesia Checklist: Emergency Drugs available, Patient identified, Suction available, Patient being monitored and Timeout performed Oxygen Delivery Method: Nasal cannula Placement Confirmation: positive ETCO2

## 2017-09-27 NOTE — Discharge Instructions (Signed)

## 2017-11-24 DIAGNOSIS — E119 Type 2 diabetes mellitus without complications: Secondary | ICD-10-CM | POA: Diagnosis not present

## 2018-01-16 DIAGNOSIS — I10 Essential (primary) hypertension: Secondary | ICD-10-CM | POA: Diagnosis not present

## 2018-01-16 DIAGNOSIS — I482 Chronic atrial fibrillation: Secondary | ICD-10-CM | POA: Diagnosis not present

## 2018-01-16 DIAGNOSIS — E1169 Type 2 diabetes mellitus with other specified complication: Secondary | ICD-10-CM | POA: Diagnosis not present

## 2018-01-16 DIAGNOSIS — Z7984 Long term (current) use of oral hypoglycemic drugs: Secondary | ICD-10-CM | POA: Diagnosis not present

## 2018-04-10 DIAGNOSIS — H353131 Nonexudative age-related macular degeneration, bilateral, early dry stage: Secondary | ICD-10-CM | POA: Diagnosis not present

## 2018-04-10 DIAGNOSIS — E119 Type 2 diabetes mellitus without complications: Secondary | ICD-10-CM | POA: Diagnosis not present

## 2018-04-10 DIAGNOSIS — H2513 Age-related nuclear cataract, bilateral: Secondary | ICD-10-CM | POA: Diagnosis not present

## 2018-06-25 DIAGNOSIS — C44311 Basal cell carcinoma of skin of nose: Secondary | ICD-10-CM | POA: Diagnosis not present

## 2018-07-26 DIAGNOSIS — Z85828 Personal history of other malignant neoplasm of skin: Secondary | ICD-10-CM | POA: Diagnosis not present

## 2018-07-26 DIAGNOSIS — Z08 Encounter for follow-up examination after completed treatment for malignant neoplasm: Secondary | ICD-10-CM | POA: Diagnosis not present

## 2018-08-05 DIAGNOSIS — E1169 Type 2 diabetes mellitus with other specified complication: Secondary | ICD-10-CM | POA: Diagnosis not present

## 2018-08-05 DIAGNOSIS — Z1389 Encounter for screening for other disorder: Secondary | ICD-10-CM | POA: Diagnosis not present

## 2018-08-05 DIAGNOSIS — Z0000 Encounter for general adult medical examination without abnormal findings: Secondary | ICD-10-CM | POA: Diagnosis not present

## 2018-08-05 DIAGNOSIS — I10 Essential (primary) hypertension: Secondary | ICD-10-CM | POA: Diagnosis not present

## 2018-08-05 DIAGNOSIS — M4696 Unspecified inflammatory spondylopathy, lumbar region: Secondary | ICD-10-CM | POA: Diagnosis not present

## 2018-08-05 DIAGNOSIS — I482 Chronic atrial fibrillation, unspecified: Secondary | ICD-10-CM | POA: Diagnosis not present

## 2019-01-17 DIAGNOSIS — D0461 Carcinoma in situ of skin of right upper limb, including shoulder: Secondary | ICD-10-CM | POA: Diagnosis not present

## 2019-01-17 DIAGNOSIS — Z08 Encounter for follow-up examination after completed treatment for malignant neoplasm: Secondary | ICD-10-CM | POA: Diagnosis not present

## 2019-01-17 DIAGNOSIS — Z85828 Personal history of other malignant neoplasm of skin: Secondary | ICD-10-CM | POA: Diagnosis not present

## 2019-02-07 DIAGNOSIS — R609 Edema, unspecified: Secondary | ICD-10-CM | POA: Diagnosis not present

## 2019-02-07 DIAGNOSIS — Z7984 Long term (current) use of oral hypoglycemic drugs: Secondary | ICD-10-CM | POA: Diagnosis not present

## 2019-02-07 DIAGNOSIS — E1169 Type 2 diabetes mellitus with other specified complication: Secondary | ICD-10-CM | POA: Diagnosis not present

## 2019-02-07 DIAGNOSIS — I10 Essential (primary) hypertension: Secondary | ICD-10-CM | POA: Diagnosis not present

## 2019-02-18 DIAGNOSIS — D0461 Carcinoma in situ of skin of right upper limb, including shoulder: Secondary | ICD-10-CM | POA: Diagnosis not present

## 2019-04-01 DIAGNOSIS — Z85828 Personal history of other malignant neoplasm of skin: Secondary | ICD-10-CM | POA: Diagnosis not present

## 2019-04-01 DIAGNOSIS — L905 Scar conditions and fibrosis of skin: Secondary | ICD-10-CM | POA: Diagnosis not present

## 2019-04-01 DIAGNOSIS — C44622 Squamous cell carcinoma of skin of right upper limb, including shoulder: Secondary | ICD-10-CM | POA: Diagnosis not present

## 2019-04-01 DIAGNOSIS — Z08 Encounter for follow-up examination after completed treatment for malignant neoplasm: Secondary | ICD-10-CM | POA: Diagnosis not present

## 2019-04-15 DIAGNOSIS — H353131 Nonexudative age-related macular degeneration, bilateral, early dry stage: Secondary | ICD-10-CM | POA: Diagnosis not present

## 2019-04-15 DIAGNOSIS — H43812 Vitreous degeneration, left eye: Secondary | ICD-10-CM | POA: Diagnosis not present

## 2019-04-15 DIAGNOSIS — E119 Type 2 diabetes mellitus without complications: Secondary | ICD-10-CM | POA: Diagnosis not present

## 2019-04-15 DIAGNOSIS — H2513 Age-related nuclear cataract, bilateral: Secondary | ICD-10-CM | POA: Diagnosis not present

## 2019-08-10 ENCOUNTER — Other Ambulatory Visit: Payer: Self-pay

## 2019-08-10 ENCOUNTER — Ambulatory Visit: Payer: Medicare Other | Attending: Internal Medicine

## 2019-08-10 DIAGNOSIS — Z23 Encounter for immunization: Secondary | ICD-10-CM

## 2019-08-10 NOTE — Progress Notes (Signed)
   Covid-19 Vaccination Clinic  Name:  Jasmine Jenkins    MRN: [id number] DOB: [birth year]  08/10/2019  Jasmine Jenkins was observed post Covid-19 immunization for 30 minutes based on pre-vaccination screening without incidence. She was provided with Vaccine Information Sheet and instruction to access the V-Safe system.   Jasmine Jenkins was instructed to call 911 with any severe reactions post vaccine: Marland Kitchen Difficulty breathing  . Swelling of your face and throat  . A fast heartbeat  . A bad rash all over your body  . Dizziness and weakness    Immunizations Administered    Name Date Dose VIS Date Route   Pfizer COVID-19 Vaccine 08/10/2019 12:37 PM 0.3 mL 07/11/2019 Intramuscular   Manufacturer: [employer], [employer]   Lot: [id number]   NDC: [address]-3

## 2019-08-15 DIAGNOSIS — H6123 Impacted cerumen, bilateral: Secondary | ICD-10-CM | POA: Diagnosis not present

## 2019-08-15 DIAGNOSIS — I10 Essential (primary) hypertension: Secondary | ICD-10-CM | POA: Diagnosis not present

## 2019-08-15 DIAGNOSIS — I482 Chronic atrial fibrillation, unspecified: Secondary | ICD-10-CM | POA: Diagnosis not present

## 2019-08-15 DIAGNOSIS — E1169 Type 2 diabetes mellitus with other specified complication: Secondary | ICD-10-CM | POA: Diagnosis not present

## 2019-08-15 DIAGNOSIS — K224 Dyskinesia of esophagus: Secondary | ICD-10-CM | POA: Diagnosis not present

## 2019-08-15 DIAGNOSIS — Z1389 Encounter for screening for other disorder: Secondary | ICD-10-CM | POA: Diagnosis not present

## 2019-08-15 DIAGNOSIS — Z0000 Encounter for general adult medical examination without abnormal findings: Secondary | ICD-10-CM | POA: Diagnosis not present

## 2019-08-15 DIAGNOSIS — H900 Conductive hearing loss, bilateral: Secondary | ICD-10-CM | POA: Diagnosis not present

## 2019-08-31 ENCOUNTER — Ambulatory Visit: Payer: Medicare Other | Attending: Internal Medicine

## 2019-08-31 DIAGNOSIS — Z23 Encounter for immunization: Secondary | ICD-10-CM | POA: Insufficient documentation

## 2019-08-31 NOTE — Progress Notes (Signed)
   Covid-19 Vaccination Clinic  Name:  Jasmine Jenkins    MRN: [id number] DOB: 28-Aug-1925  08/31/2019  Jasmine Jenkins was observed post Covid-19 immunization for 15 minutes without incidence. She was provided with Vaccine Information Sheet and instruction to access the V-Safe system.   Jasmine Jenkins was instructed to call 911 with any severe reactions post vaccine: Marland Kitchen Difficulty breathing  . Swelling of your face and throat  . A fast heartbeat  . A bad rash all over your body  . Dizziness and weakness    Immunizations Administered    Name Date Dose VIS Date Route   Pfizer COVID-19 Vaccine 08/31/2019 11:53 AM 0.3 mL 07/11/2019 Intramuscular   Manufacturer: [employer], [employer]   Lot: [id number]   NDC: [address]-8

## 2019-10-03 DIAGNOSIS — Z08 Encounter for follow-up examination after completed treatment for malignant neoplasm: Secondary | ICD-10-CM | POA: Diagnosis not present

## 2019-10-03 DIAGNOSIS — X32XXXD Exposure to sunlight, subsequent encounter: Secondary | ICD-10-CM | POA: Diagnosis not present

## 2019-10-03 DIAGNOSIS — Z85828 Personal history of other malignant neoplasm of skin: Secondary | ICD-10-CM | POA: Diagnosis not present

## 2019-10-03 DIAGNOSIS — L570 Actinic keratosis: Secondary | ICD-10-CM | POA: Diagnosis not present

## 2019-11-10 ENCOUNTER — Emergency Department (HOSPITAL_COMMUNITY): Payer: Medicare Other

## 2019-11-10 ENCOUNTER — Inpatient Hospital Stay (HOSPITAL_COMMUNITY)
Admission: EM | Admit: 2019-11-10 | Discharge: 2019-11-18 | DRG: 481 | Disposition: A | Discharge status: Skilled Nursing Facility | Payer: Medicare Other | Attending: Internal Medicine | Admitting: Internal Medicine

## 2019-11-10 ENCOUNTER — Encounter (HOSPITAL_COMMUNITY): Payer: Self-pay

## 2019-11-10 DIAGNOSIS — K2970 Gastritis, unspecified, without bleeding: Secondary | ICD-10-CM | POA: Diagnosis present

## 2019-11-10 DIAGNOSIS — K449 Diaphragmatic hernia without obstruction or gangrene: Secondary | ICD-10-CM | POA: Diagnosis not present

## 2019-11-10 DIAGNOSIS — Z7401 Bed confinement status: Secondary | ICD-10-CM | POA: Diagnosis not present

## 2019-11-10 DIAGNOSIS — Z7901 Long term (current) use of anticoagulants: Secondary | ICD-10-CM

## 2019-11-10 DIAGNOSIS — M2550 Pain in unspecified joint: Secondary | ICD-10-CM | POA: Diagnosis not present

## 2019-11-10 DIAGNOSIS — K5900 Constipation, unspecified: Secondary | ICD-10-CM | POA: Diagnosis not present

## 2019-11-10 DIAGNOSIS — S7291XD Unspecified fracture of right femur, subsequent encounter for closed fracture with routine healing: Secondary | ICD-10-CM | POA: Diagnosis not present

## 2019-11-10 DIAGNOSIS — E1165 Type 2 diabetes mellitus with hyperglycemia: Secondary | ICD-10-CM | POA: Diagnosis not present

## 2019-11-10 DIAGNOSIS — M25551 Pain in right hip: Secondary | ICD-10-CM | POA: Diagnosis not present

## 2019-11-10 DIAGNOSIS — S72144A Nondisplaced intertrochanteric fracture of right femur, initial encounter for closed fracture: Secondary | ICD-10-CM

## 2019-11-10 DIAGNOSIS — Z20822 Contact with and (suspected) exposure to covid-19: Secondary | ICD-10-CM | POA: Diagnosis not present

## 2019-11-10 DIAGNOSIS — Z419 Encounter for procedure for purposes other than remedying health state, unspecified: Secondary | ICD-10-CM

## 2019-11-10 DIAGNOSIS — Z880 Allergy status to penicillin: Secondary | ICD-10-CM | POA: Diagnosis not present

## 2019-11-10 DIAGNOSIS — Z87891 Personal history of nicotine dependence: Secondary | ICD-10-CM | POA: Diagnosis not present

## 2019-11-10 DIAGNOSIS — M1990 Unspecified osteoarthritis, unspecified site: Secondary | ICD-10-CM | POA: Diagnosis present

## 2019-11-10 DIAGNOSIS — K222 Esophageal obstruction: Secondary | ICD-10-CM

## 2019-11-10 DIAGNOSIS — Z8619 Personal history of other infectious and parasitic diseases: Secondary | ICD-10-CM

## 2019-11-10 DIAGNOSIS — S72009A Fracture of unspecified part of neck of unspecified femur, initial encounter for closed fracture: Secondary | ICD-10-CM | POA: Diagnosis present

## 2019-11-10 DIAGNOSIS — E1151 Type 2 diabetes mellitus with diabetic peripheral angiopathy without gangrene: Secondary | ICD-10-CM | POA: Diagnosis not present

## 2019-11-10 DIAGNOSIS — W010XXA Fall on same level from slipping, tripping and stumbling without subsequent striking against object, initial encounter: Secondary | ICD-10-CM | POA: Diagnosis present

## 2019-11-10 DIAGNOSIS — I350 Nonrheumatic aortic (valve) stenosis: Secondary | ICD-10-CM | POA: Diagnosis not present

## 2019-11-10 DIAGNOSIS — D62 Acute posthemorrhagic anemia: Secondary | ICD-10-CM | POA: Diagnosis not present

## 2019-11-10 DIAGNOSIS — K224 Dyskinesia of esophagus: Secondary | ICD-10-CM | POA: Diagnosis present

## 2019-11-10 DIAGNOSIS — Z538 Procedure and treatment not carried out for other reasons: Secondary | ICD-10-CM | POA: Diagnosis not present

## 2019-11-10 DIAGNOSIS — I10 Essential (primary) hypertension: Secondary | ICD-10-CM | POA: Diagnosis present

## 2019-11-10 DIAGNOSIS — Z79899 Other long term (current) drug therapy: Secondary | ICD-10-CM | POA: Diagnosis not present

## 2019-11-10 DIAGNOSIS — S72001A Fracture of unspecified part of neck of right femur, initial encounter for closed fracture: Secondary | ICD-10-CM | POA: Diagnosis not present

## 2019-11-10 DIAGNOSIS — Y92007 Garden or yard of unspecified non-institutional (private) residence as the place of occurrence of the external cause: Secondary | ICD-10-CM | POA: Diagnosis not present

## 2019-11-10 DIAGNOSIS — I2510 Atherosclerotic heart disease of native coronary artery without angina pectoris: Secondary | ICD-10-CM | POA: Diagnosis present

## 2019-11-10 DIAGNOSIS — I5031 Acute diastolic (congestive) heart failure: Secondary | ICD-10-CM | POA: Diagnosis not present

## 2019-11-10 DIAGNOSIS — Z8719 Personal history of other diseases of the digestive system: Secondary | ICD-10-CM

## 2019-11-10 DIAGNOSIS — I4891 Unspecified atrial fibrillation: Secondary | ICD-10-CM | POA: Diagnosis present

## 2019-11-10 DIAGNOSIS — Y93H2 Activity, gardening and landscaping: Secondary | ICD-10-CM | POA: Diagnosis not present

## 2019-11-10 DIAGNOSIS — R1310 Dysphagia, unspecified: Secondary | ICD-10-CM

## 2019-11-10 DIAGNOSIS — Z825 Family history of asthma and other chronic lower respiratory diseases: Secondary | ICD-10-CM

## 2019-11-10 DIAGNOSIS — I272 Pulmonary hypertension, unspecified: Secondary | ICD-10-CM | POA: Diagnosis present

## 2019-11-10 DIAGNOSIS — S72141A Displaced intertrochanteric fracture of right femur, initial encounter for closed fracture: Principal | ICD-10-CM | POA: Diagnosis present

## 2019-11-10 DIAGNOSIS — M25561 Pain in right knee: Secondary | ICD-10-CM | POA: Diagnosis not present

## 2019-11-10 DIAGNOSIS — R918 Other nonspecific abnormal finding of lung field: Secondary | ICD-10-CM | POA: Diagnosis not present

## 2019-11-10 DIAGNOSIS — Z7984 Long term (current) use of oral hypoglycemic drugs: Secondary | ICD-10-CM | POA: Diagnosis not present

## 2019-11-10 DIAGNOSIS — D72829 Elevated white blood cell count, unspecified: Secondary | ICD-10-CM | POA: Diagnosis not present

## 2019-11-10 DIAGNOSIS — R933 Abnormal findings on diagnostic imaging of other parts of digestive tract: Secondary | ICD-10-CM | POA: Diagnosis not present

## 2019-11-10 DIAGNOSIS — Z4789 Encounter for other orthopedic aftercare: Secondary | ICD-10-CM | POA: Diagnosis not present

## 2019-11-10 DIAGNOSIS — S72001D Fracture of unspecified part of neck of right femur, subsequent encounter for closed fracture with routine healing: Secondary | ICD-10-CM | POA: Diagnosis not present

## 2019-11-10 DIAGNOSIS — R5381 Other malaise: Secondary | ICD-10-CM | POA: Diagnosis not present

## 2019-11-10 DIAGNOSIS — I4821 Permanent atrial fibrillation: Secondary | ICD-10-CM | POA: Diagnosis present

## 2019-11-10 DIAGNOSIS — E119 Type 2 diabetes mellitus without complications: Secondary | ICD-10-CM | POA: Diagnosis not present

## 2019-11-10 DIAGNOSIS — R633 Feeding difficulties: Secondary | ICD-10-CM | POA: Diagnosis not present

## 2019-11-10 DIAGNOSIS — Z03818 Encounter for observation for suspected exposure to other biological agents ruled out: Secondary | ICD-10-CM | POA: Diagnosis not present

## 2019-11-10 DIAGNOSIS — S72141D Displaced intertrochanteric fracture of right femur, subsequent encounter for closed fracture with routine healing: Secondary | ICD-10-CM | POA: Diagnosis not present

## 2019-11-10 DIAGNOSIS — Z885 Allergy status to narcotic agent status: Secondary | ICD-10-CM

## 2019-11-10 DIAGNOSIS — R000 Tachycardia, unspecified: Secondary | ICD-10-CM | POA: Diagnosis not present

## 2019-11-10 DIAGNOSIS — R52 Pain, unspecified: Secondary | ICD-10-CM | POA: Diagnosis not present

## 2019-11-10 LAB — BASIC METABOLIC PANEL
Anion gap: 9 (ref 5–15)
BUN: 19 mg/dL (ref 8–23)
CO2: 23 mmol/L (ref 22–32)
Calcium: 8.9 mg/dL (ref 8.9–10.3)
Chloride: 101 mmol/L (ref 98–111)
Creatinine, Ser: 0.63 mg/dL (ref 0.44–1.00)
GFR calc Af Amer: >60 mL/min (ref >60)
GFR calc non Af Amer: >60 mL/min (ref >60)
Glucose, Bld: 259 mg/dL — ABNORMAL HIGH (ref 70–99)
Potassium: 4.9 mmol/L (ref 3.5–5.1)
Sodium: 133 mmol/L — ABNORMAL LOW (ref 135–145)

## 2019-11-10 LAB — RESPIRATORY PANEL BY RT PCR (FLU A&B, COVID)
Influenza A by PCR: NEGATIVE
Influenza B by PCR: NEGATIVE
SARS Coronavirus 2 by RT PCR: NEGATIVE

## 2019-11-10 LAB — CBC WITH DIFFERENTIAL/PLATELET
Abs Immature Granulocytes: 0.1 10*3/uL — ABNORMAL HIGH (ref 0.00–0.07)
Basophils Absolute: 0 10*3/uL (ref 0.0–0.1)
Basophils Relative: 0 %
Eosinophils Absolute: 0.1 10*3/uL (ref 0.0–0.5)
Eosinophils Relative: 0 %
HCT: 41.2 % (ref 36.0–46.0)
Hemoglobin: 12.6 g/dL (ref 12.0–15.0)
Immature Granulocytes: 1 %
Lymphocytes Relative: 5 %
Lymphs Abs: 0.9 10*3/uL (ref 0.7–4.0)
MCH: 28.4 pg (ref 26.0–34.0)
MCHC: 30.6 g/dL (ref 30.0–36.0)
MCV: 93 fL (ref 80.0–100.0)
Monocytes Absolute: 0.8 10*3/uL (ref 0.1–1.0)
Monocytes Relative: 4 %
Neutro Abs: 16.4 10*3/uL — ABNORMAL HIGH (ref 1.7–7.7)
Neutrophils Relative %: 90 %
Platelets: 169 10*3/uL (ref 150–400)
RBC: 4.43 MIL/uL (ref 3.87–5.11)
RDW: 13.6 % (ref 11.5–15.5)
WBC: 18.3 10*3/uL — ABNORMAL HIGH (ref 4.0–10.5)
nRBC: 0 % (ref 0.0–0.2)

## 2019-11-10 LAB — URINALYSIS, ROUTINE W REFLEX MICROSCOPIC
Bacteria, UA: NONE SEEN
Bilirubin Urine: NEGATIVE
Glucose, UA: >=500 mg/dL — AB
Hgb urine dipstick: NEGATIVE
Ketones, ur: NEGATIVE mg/dL
Leukocytes,Ua: NEGATIVE
Nitrite: NEGATIVE
Protein, ur: NEGATIVE mg/dL
Specific Gravity, Urine: 1.012 (ref 1.005–1.030)
pH: 6 (ref 5.0–8.0)

## 2019-11-10 LAB — PROTIME-INR
INR: 1.2 (ref 0.8–1.2)
Prothrombin Time: 15.4 seconds — ABNORMAL HIGH (ref 11.4–15.2)

## 2019-11-10 LAB — SURGICAL PCR SCREEN
MRSA, PCR: NEGATIVE
Staphylococcus aureus: NEGATIVE

## 2019-11-10 LAB — BRAIN NATRIURETIC PEPTIDE: B Natriuretic Peptide: 239.6 pg/mL — ABNORMAL HIGH (ref 0.0–100.0)

## 2019-11-10 LAB — CBG MONITORING, ED: Glucose-Capillary: 241 mg/dL — ABNORMAL HIGH (ref 70–99)

## 2019-11-10 LAB — GLUCOSE, CAPILLARY: Glucose-Capillary: 187 mg/dL — ABNORMAL HIGH (ref 70–99)

## 2019-11-10 MED ORDER — ONDANSETRON HCL 4 MG/2ML IJ SOLN
4.0000 mg | Freq: Four times a day (QID) | INTRAMUSCULAR | Status: DC | PRN
  Administered 2019-11-11: 4 mg via INTRAVENOUS
  Filled 2019-11-10: qty 2

## 2019-11-10 MED ORDER — MORPHINE SULFATE (PF) 2 MG/ML IV SOLN
2.0000 mg | INTRAVENOUS | Status: DC | PRN
  Administered 2019-11-10 – 2019-11-18 (×7): 2 mg via INTRAVENOUS
  Filled 2019-11-10 (×7): qty 1

## 2019-11-10 MED ORDER — LOSARTAN POTASSIUM 50 MG PO TABS
50.0000 mg | ORAL_TABLET | Freq: Every day | ORAL | Status: DC
  Administered 2019-11-10 – 2019-11-18 (×6): 50 mg via ORAL
  Filled 2019-11-10 (×7): qty 1

## 2019-11-10 MED ORDER — VITAMIN B-12 1000 MCG PO TABS
1000.0000 ug | ORAL_TABLET | Freq: Every day | ORAL | Status: DC
  Administered 2019-11-12 – 2019-11-18 (×4): 1000 ug via ORAL
  Filled 2019-11-10 (×5): qty 1

## 2019-11-10 MED ORDER — INSULIN ASPART 100 UNIT/ML ~~LOC~~ SOLN
0.0000 [IU] | Freq: Every day | SUBCUTANEOUS | Status: DC
  Administered 2019-11-11: 2 [IU] via SUBCUTANEOUS
  Filled 2019-11-10: qty 0.05

## 2019-11-10 MED ORDER — ONDANSETRON HCL 4 MG/2ML IJ SOLN
4.0000 mg | Freq: Once | INTRAMUSCULAR | Status: AC
  Administered 2019-11-10: 4 mg via INTRAVENOUS
  Filled 2019-11-10: qty 2

## 2019-11-10 MED ORDER — SENNA 8.6 MG PO TABS
1.0000 | ORAL_TABLET | Freq: Two times a day (BID) | ORAL | Status: DC
  Administered 2019-11-10 – 2019-11-18 (×10): 8.6 mg via ORAL
  Filled 2019-11-10 (×13): qty 1

## 2019-11-10 MED ORDER — FUROSEMIDE 40 MG PO TABS: 20.0000 mg | ORAL_TABLET | Freq: Every day | ORAL | Status: DC

## 2019-11-10 MED ORDER — OXYCODONE HCL 5 MG PO TABS
5.0000 mg | ORAL_TABLET | Freq: Four times a day (QID) | ORAL | Status: DC | PRN
  Administered 2019-11-10 – 2019-11-18 (×19): 5 mg via ORAL
  Filled 2019-11-10 (×20): qty 1

## 2019-11-10 MED ORDER — INSULIN ASPART 100 UNIT/ML ~~LOC~~ SOLN
0.0000 [IU] | Freq: Three times a day (TID) | SUBCUTANEOUS | Status: DC
  Administered 2019-11-10: 3 [IU] via SUBCUTANEOUS
  Administered 2019-11-11 – 2019-11-12 (×4): 2 [IU] via SUBCUTANEOUS
  Administered 2019-11-12 – 2019-11-13 (×2): 1 [IU] via SUBCUTANEOUS
  Administered 2019-11-13 (×2): 2 [IU] via SUBCUTANEOUS
  Administered 2019-11-14: 1 [IU] via SUBCUTANEOUS
  Administered 2019-11-14: 2 [IU] via SUBCUTANEOUS
  Administered 2019-11-14 – 2019-11-15 (×3): 1 [IU] via SUBCUTANEOUS
  Administered 2019-11-15: 2 [IU] via SUBCUTANEOUS
  Administered 2019-11-16: 1 [IU] via SUBCUTANEOUS
  Administered 2019-11-16: 3 [IU] via SUBCUTANEOUS
  Administered 2019-11-16 – 2019-11-18 (×5): 1 [IU] via SUBCUTANEOUS
  Filled 2019-11-10: qty 0.09

## 2019-11-10 MED ORDER — HYDROMORPHONE HCL 1 MG/ML IJ SOLN
1.0000 mg | Freq: Once | INTRAMUSCULAR | Status: AC
  Administered 2019-11-10: 16:00:00 1 mg via INTRAVENOUS
  Filled 2019-11-10: qty 1

## 2019-11-10 MED ORDER — POLYETHYLENE GLYCOL 3350 17 G PO PACK
17.0000 g | PACK | Freq: Every day | ORAL | Status: DC
  Administered 2019-11-16: 17 g via ORAL
  Filled 2019-11-10 (×5): qty 1

## 2019-11-10 MED ORDER — METOPROLOL SUCCINATE ER 50 MG PO TB24
50.0000 mg | ORAL_TABLET | Freq: Every day | ORAL | Status: DC
  Administered 2019-11-10 – 2019-11-15 (×5): 50 mg via ORAL
  Filled 2019-11-10 (×6): qty 1

## 2019-11-10 MED ORDER — FUROSEMIDE 10 MG/ML IJ SOLN
20.0000 mg | Freq: Once | INTRAMUSCULAR | Status: AC
  Administered 2019-11-10: 20 mg via INTRAVENOUS
  Filled 2019-11-10: qty 4

## 2019-11-10 MED ORDER — SODIUM CHLORIDE 0.9 % IV BOLUS
500.0000 mL | Freq: Once | INTRAVENOUS | Status: AC
  Administered 2019-11-10: 500 mL via INTRAVENOUS

## 2019-11-10 NOTE — ED Provider Notes (Signed)
[facility] DEPT Provider Note   CSN: [id number] Arrival date & time: 11/10/19  1419     History Chief Complaint  Patient presents with  . Hip Pain  . Fall    Jasmine Jenkins is a 86 y.o. female who is on Xarelto and metoprolol for chronic A. fib, presented to emergency department mechanical fall and right hip pain.  She says she was out in her yard today picking up sticks and preparing to move her lawn.  She lost her balance and fell over onto her right hip.  She did not strike her head on the ground.  She did not lose consciousness.  She had immediate pain in her right hip was unable to ambulate.  EMS arrived and gave the patient 100 mcg of fentanyl in route.  On arrival the patient continues to complain of pain in her right hip.  She has never had a hip fracture before.  She does not have any prosthesis in her hips.  She has no numbness in her feet.  Her pain is 8/10.  She lives by herself but her son lives next door.  HPI     Past Medical History:  Diagnosis Date  . Arthritis   . Chronic back pain   . Chronic kidney disease   . Diabetes ([place])   . Diverticulosis   . GERD (gastroesophageal reflux disease)   . H/O hiatal hernia   . Hypertension   . Lower extremity edema   . Mild pulmonary hypertension ([place])   . Peripheral vascular disease ([place])   . Persistent atrial fibrillation ([place])   . PONV (postoperative nausea and vomiting)    when she is completely put under  . Renal artery stenosis ([place])   . Streptococcal sepsis [facility])     Patient Active Problem List   Diagnosis Date Noted  . Closed intertrochanteric fracture of right hip ([place]) 11/10/2019  . Closed right hip fracture ([place]) 11/10/2019  . Dysphagia 08/30/2017  . Stricture and stenosis of esophagus 08/30/2017  . CAD, NATIVE VESSEL 01/04/2010  . CHEST PAIN-PRECORDIAL 05/27/2009  . HYPERTENSION, BENIGN 05/26/2009  . Atrial fibrillation ([place]) 05/26/2009  . DYSPNEA 05/26/2009  .  Atherosclerosis of renal artery ([place]) 05/21/2009    Past Surgical History:  Procedure Laterality Date  . BACK SURGERY    . BALLOON DILATION N/A 09/27/2017   Procedure: BALLOON DILATION;  Surgeon: Wilford Corner, MD;  Location: [facility] ENDOSCOPY;  Service: Endoscopy;  Laterality: N/A;  . CARDIAC CATHETERIZATION    . CARDIOVERSION  11/29/2011   Procedure: CARDIOVERSION;  Surgeon: Jettie Booze, MD;  Location: [place];  Service: Cardiovascular;  Laterality: N/A;  . CARDIOVERSION N/A 10/02/2012   Procedure: CARDIOVERSION;  Surgeon: Jettie Booze, MD;  Location: [place];  Service: Cardiovascular;  Laterality: N/A;  . ESOPHAGOGASTRODUODENOSCOPY (EGD) WITH PROPOFOL N/A 08/24/2016   Procedure: ESOPHAGOGASTRODUODENOSCOPY (EGD) WITH PROPOFOL;  Surgeon: Teena Irani, MD;  Location: [place];  Service: Endoscopy;  Laterality: N/A;  . ESOPHAGOGASTRODUODENOSCOPY (EGD) WITH PROPOFOL N/A 09/27/2017   Procedure: ESOPHAGOGASTRODUODENOSCOPY (EGD) WITH PROPOFOL;  Surgeon: Wilford Corner, MD;  Location: [place];  Service: Endoscopy;  Laterality: N/A;  . HERNIA REPAIR    . SAVORY DILATION N/A 08/24/2016   Procedure: SAVORY DILATION;  Surgeon: Teena Irani, MD;  Location: [facility] ENDOSCOPY;  Service: Endoscopy;  Laterality: N/A;  . SAVORY DILATION N/A 09/27/2017   Procedure: SAVORY DILATION;  Surgeon: Wilford Corner, MD;  Location: [place];  Service: Endoscopy;  Laterality: N/A;  OB History   No obstetric history on file.     Family History  Problem Relation Age of Onset  . COPD Brother     Social History   Tobacco Use  . Smoking status: Former Smoker    Quit date: 08/01/1957    Years since quitting: 62.3  . Smokeless tobacco: Never Used  Substance Use Topics  . Alcohol use: No  . Drug use: No    Home Medications Prior to Admission medications   Medication Sig Start Date End Date Taking? Authorizing Provider  glimepiride (AMARYL) 2 MG tablet Take 2 mg by mouth daily with  breakfast.    Yes [provider]  losartan (COZAAR) 50 MG tablet Take 50 mg by mouth daily.   Yes [provider]  metoprolol succinate (TOPROL-XL) 50 MG 24 hr tablet Take 50 mg by mouth daily. Take with or immediately following a meal.   Yes [provider]  Multiple Vitamins-Minerals (ICAPS AREDS FORMULA PO) Take 1 capsule by mouth 2 (two) times daily.    Yes [provider]  Rivaroxaban (XARELTO) 20 MG TABS Take 1 tablet by mouth daily with supper.    Yes [provider]  vitamin B-12 (CYANOCOBALAMIN) 1000 MCG tablet Take 1,000 mcg by mouth daily.   Yes [provider]    Allergies    Penicillins, Codeine, and Hydrocodone-acetaminophen  Review of Systems   Review of Systems  Constitutional: Negative for chills and fever.  HENT: Negative for ear pain and sore throat.   Eyes: Negative for pain and visual disturbance.  Respiratory: Negative for cough and shortness of breath.   Cardiovascular: Negative for chest pain and palpitations.  Gastrointestinal: Negative for abdominal pain and vomiting.  Genitourinary: Negative for dysuria and hematuria.  Musculoskeletal: Positive for arthralgias and myalgias.  Skin: Negative for color change and rash.  Neurological: Negative for seizures and syncope.  Psychiatric/Behavioral: Negative for agitation and confusion.  All other systems reviewed and are negative.   Physical Exam Updated Vital Signs BP (!) 167/95 (BP Location: Right Arm)   Pulse (!) 27   Temp 97.6 F (36.4 C) (Oral)   Resp (!) 26   Ht 5\' 5"  (1.651 m)   Wt 57.2 kg   SpO2 100%   BMI 20.97 kg/m   Physical Exam Vitals and nursing note reviewed.  Constitutional:      General: She is not in acute distress.    Appearance: She is well-developed.  HENT:     Head: Normocephalic and atraumatic.  Eyes:     Conjunctiva/sclera: Conjunctivae normal.  Cardiovascular:     Rate and Rhythm: Tachycardia present. Rhythm irregular.       Pulses: Normal pulses.     Comments: HR 100-110 bpm Pulmonary:     Effort: Pulmonary effort is normal. No respiratory distress.     Breath sounds: Normal breath sounds.  Abdominal:     Palpations: Abdomen is soft.     Tenderness: There is no abdominal tenderness.  Musculoskeletal:     Cervical back: Neck supple.     Comments: Shortening of right leg Pain and tenderness near right hip  Skin:    General: Skin is warm and dry.  Neurological:     Mental Status: She is alert.  Psychiatric:        Mood and Affect: Mood normal.        Behavior: Behavior normal.     ED Results / Procedures / Treatments   Labs (all labs  ordered are listed, but only abnormal results are displayed) Labs Reviewed  BASIC METABOLIC PANEL - Abnormal; Notable for the following components:      Result Value   Sodium 133 (*)    Glucose, Bld 259 (*)    All other components within normal limits  CBC WITH DIFFERENTIAL/PLATELET - Abnormal; Notable for the following components:   WBC 18.3 (*)    Neutro Abs 16.4 (*)    Abs Immature Granulocytes 0.10 (*)    All other components within normal limits  PROTIME-INR - Abnormal; Notable for the following components:   Prothrombin Time 15.4 (*)    All other components within normal limits  CBG MONITORING, ED - Abnormal; Notable for the following components:   Glucose-Capillary 241 (*)    All other components within normal limits  RESPIRATORY PANEL BY RT PCR (FLU A&B, COVID)  URINALYSIS, ROUTINE W REFLEX MICROSCOPIC  BASIC METABOLIC PANEL  CBC  BRAIN NATRIURETIC PEPTIDE    EKG None  Radiology DG CHEST PORT 1 VIEW  Result Date: 11/10/2019 CLINICAL DATA:  Known right femur fracture, surgery planned for tomorrow EXAM: PORTABLE CHEST 1 VIEW COMPARISON:  CT 05/08/2009, esophagram 08/22/2017 FINDINGS: Diffuse mixed interstitial and airspace opacities throughout both lungs with a perihilar and peripheral predominance. Indistinct pulmonary vascularity. No visible  pneumothorax or effusion. Biapical pleuroparenchymal scarring is noted. Slight prominence of the cardiomediastinal silhouette and a moderate hiatal hernia which was seen on comparison study. No acute osseous or soft tissue abnormality. Degenerative changes are present in the imaged spine and shoulders. Telemetry leads overlie the chest. IMPRESSION: Mixed interstitial airspace opacities throughout both lungs with indistinct vascularity and a prominent cardiomediastinal silhouette favor CHF/volume overload likely with some atelectasis though underlying infection is not excluded. Electronically Signed   By: Kreg Shropshire M.D.   On: 11/10/2019 17:29   DG Hip [place] W or [place] Pelvis 1 View Right  Result Date: 11/10/2019 CLINICAL DATA:  Fall, right hip EXAM: DG HIP (WITH OR WITHOUT PELVIS) 1V PORT RIGHT COMPARISON:  None. FINDINGS: There is a right femoral intertrochanteric fracture. Slight varus angulation. No subluxation or dislocation. IMPRESSION: Right intertrochanteric fracture with slight varus angulation. Electronically Signed   By: Charlett Nose M.D.   On: 11/10/2019 15:25   DG FEMUR, MIN 2 VIEWS RIGHT  Result Date: 11/10/2019 CLINICAL DATA:  Known right hip fracture, surgery planned for tomorrow EXAM: RIGHT FEMUR 2 VIEWS COMPARISON:  Radiograph 11/10/2019 FINDINGS: There is redemonstration of the varus angulated right intertrochanteric femur fracture. The femoral head remains normally located. The distal femur is intact. Alignment at the knee is grossly preserved albeit with moderate tricompartmental degenerative changes and meniscal chondrocalcinosis of both medial and lateral compartments as well as arthrosis with the corticated fabella posteriorly. Vascular calcium noted at the knee as well. IMPRESSION: 1. Varus angulated right intertrochanteric femur fracture. No distal femoral fractures. 2. 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 Electronically Signed   By: Kreg Shropshire  M.D.   On: 11/10/2019 17:27    Procedures Procedures (including critical care time)  Medications Ordered in ED Medications  insulin aspart (novoLOG) injection 0-9 Units (3 Units Subcutaneous Given 11/10/19 1709)  insulin aspart (novoLOG) injection 0-5 Units (has no administration in time range)  morphine 2 MG/ML injection 2 mg (has no administration in time range)  oxyCODONE (Oxy IR/ROXICODONE) immediate release tablet 5 mg (has no administration in time range)  senna (SENOKOT) tablet 8.6 mg (has no administration in time range)  polyethylene  glycol (MIRALAX / GLYCOLAX) packet 17 g (17 g Oral Not Given 11/10/19 1811)  ondansetron (ZOFRAN) injection 4 mg (has no administration in time range)  losartan (COZAAR) tablet 50 mg (has no administration in time range)  metoprolol succinate (TOPROL-XL) 24 hr tablet 50 mg (has no administration in time range)  vitamin B-12 (CYANOCOBALAMIN) tablet 1,000 mcg (has no administration in time range)  furosemide (LASIX) injection 20 mg (has no administration in time range)  furosemide (LASIX) tablet 20 mg (has no administration in time range)  ondansetron (ZOFRAN) injection 4 mg (4 mg Intravenous Given 11/10/19 1537)  HYDROmorphone (DILAUDID) injection 1 mg (1 mg Intravenous Given 11/10/19 1538)  sodium chloride 0.9 % bolus 500 mL (0 mLs Intravenous Stopped 11/10/19 1811)    ED Course  I have reviewed the triage vital signs and the nursing notes.  Pertinent labs & imaging results that were available during my care of the patient were reviewed by me and considered in my medical decision making (see chart for details).  86 yo female here with mechanical fall onto right hip, found to have right hip fracture.  No head trauma or LOC.  Patient on xarelto for A Fib, takes it at night, hasn't had any today.  Neurovascularly intact She was independent prior to this unfortunate incident  No active urinary/pulm infectious symptoms.  No clear what is causing the  leukocytosis - reactive to pain?  Here in A Fib but BP stable, HR 110-110 bpm, I would not rate  Control further at this moment, suspect this is reactive to her pain  Very sweet lady Plan for pain control, discuss with ortho, admit  Clinical Course as of Nov 10 1827  Mon Nov 10, 2019  1529 IMPRESSION: Right intertrochanteric fracture with slight varus angulation   [MT]  1534 Spoke to [facility] who recommends hospitalist admission, likely OR tomorrow, NPO at midnight   [MT]  1620 Dr Susa Simmonds requesting transfer to [facility] for OR tomorrow.  Signed out to hospitalist now   [MT]    Clinical Course User Index [MT] Ka Bench, Kermit Balo, MD    Final Clinical Impression(s) / ED Diagnoses Final diagnoses:  Hip fracture (HCC)  Leucocytosis    Rx / DC Orders ED Discharge Orders    None       Mirely Pangle, Kermit Balo, MD 11/10/19 [phone]

## 2019-11-10 NOTE — ED Notes (Signed)
[facility] called for transport. 

## 2019-11-10 NOTE — ED Notes (Signed)
Trifan, EDP at bedside.  

## 2019-11-10 NOTE — ED Notes (Signed)
Patient reports it is her right hip not left that hurts.

## 2019-11-10 NOTE — H&P (Addendum)
History and Physical    Jasmine Jenkins RCB:[id number] DOB: 1925-11-09 DOA: 11/10/2019  PCP: Kirby Funk, MD   Patient coming from: Home    Chief Complaint: Fall, right hip pain  HPI: Jasmine Jenkins is a 86 y.o. female with medical history significant of permanent A. fib on Xarelto, hypertension, diabetes type 2 who presents to the emergency department for the evaluation of right hip pain.  Patient is pretty independent and lives by herself.  She was working in her yard and she was preparing to mow her lawn when she had a mechanical fall today, followed by immediate pain and decreased mobility of her right hip.  She did not strike her head on the ground.  She did not lose any consciousness.  She was unable to ambulate after she fell.  EMS arrived and she was given 100 mcg of fentanyl en route. She follows with Dr. Johney Frame for her A. fib.  She is on Xarelto for anticoagulation and takes metoprolol for rate control.  Her son lives next door. Patient seen and examined at the bedside in the emergency department.  During my evaluation, she was in A. fib with RVR.  She complained of pain on the right hip.  No history of fever, chills, cough, shortness of breath, chest pain, dysuria, nausea, vomiting or diarrhea.  Son was present at the bedside.  ED Course: On presentation, she was tachycardic.  She was complaining of pain on her right hip.  No history of fractures in the past.  She rated her pain as 8 /10.  X-ray of the hips showed right intertrochanteric fracture with slight varus angulation.  Orthopedics consulted and planning for OR tomorrow at [facility].   Review of Systems: As per HPI otherwise 10 point review of systems negative.    Past Medical History:  Diagnosis Date  . Arthritis   . Chronic back pain   . Chronic kidney disease   . Diabetes (HCC)   . Diverticulosis   . GERD (gastroesophageal reflux disease)   . H/O hiatal hernia   . Hypertension   . Lower extremity edema   . Mild  pulmonary hypertension (HCC)   . Peripheral vascular disease (HCC)   . Persistent atrial fibrillation (HCC)   . PONV (postoperative nausea and vomiting)    when she is completely put under  . Renal artery stenosis (HCC)   . Streptococcal sepsis [facility])     Past Surgical History:  Procedure Laterality Date  . BACK SURGERY    . BALLOON DILATION N/A 09/27/2017   Procedure: BALLOON DILATION;  Surgeon: Charlott Rakes, MD;  Location[facility] ENDOSCOPY;  Service: Endoscopy;  Laterality: N/A;  . CARDIAC CATHETERIZATION    . CARDIOVERSION  11/29/2011   Procedure: CARDIOVERSION;  Surgeon: Corky Crafts, MD;  Location: [facility] OR;  Service: Cardiovascular;  Laterality: N/A;  . CARDIOVERSION N/A 10/02/2012   Procedure: CARDIOVERSION;  Surgeon: Corky Crafts, MD;  Location: [facility] ENDOSCOPY;  Service: Cardiovascular;  Laterality: N/A;  . ESOPHAGOGASTRODUODENOSCOPY (EGD) WITH PROPOFOL N/A 08/24/2016   Procedure: ESOPHAGOGASTRODUODENOSCOPY (EGD) WITH PROPOFOL;  Surgeon: Dorena Cookey, MD;  Location: [facility] ENDOSCOPY;  Service: Endoscopy;  Laterality: N/A;  . ESOPHAGOGASTRODUODENOSCOPY (EGD) WITH PROPOFOL N/A 09/27/2017   Procedure: ESOPHAGOGASTRODUODENOSCOPY (EGD) WITH PROPOFOL;  Surgeon: Charlott Rakes, MD;  Location: [facility] ENDOSCOPY;  Service: Endoscopy;  Laterality: N/A;  . HERNIA REPAIR    . SAVORY DILATION N/A 08/24/2016   Procedure: SAVORY DILATION;  Surgeon: Dorena Cookey, MD;  Location: [facility] ENDOSCOPY;  Service: Endoscopy;  Laterality: N/A;  . SAVORY DILATION N/A 09/27/2017   Procedure: SAVORY DILATION;  Surgeon: Charlott Rakes, MD;  Location: [facility] ENDOSCOPY;  Service: Endoscopy;  Laterality: N/A;     reports that she quit smoking about 62 years ago. She has never used smokeless tobacco. She reports that she does not drink alcohol or use drugs.  Allergies  Allergen Reactions  . Penicillins Anaphylaxis    Has patient had a PCN reaction causing immediate rash, facial/tongue/throat swelling, SOB or  lightheadedness with hypotension: Yes Has patient had a PCN reaction causing severe rash involving mucus membranes or skin necrosis: No Has patient had a PCN reaction that required hospitalization: No Has patient had a PCN reaction occurring within the last 10 years: No If all of the above answers are "NO", then may proceed with Cephalosporin use.   . Codeine     REACTION: GI Intolerance  . Hydrocodone-Acetaminophen     REACTION: GI Intolerance    Family History  Problem Relation Age of Onset  . COPD Brother      Prior to Admission medications   Medication Sig Start Date End Date Taking? Authorizing Provider  glimepiride (AMARYL) 2 MG tablet Take 2 mg by mouth daily with breakfast.     [provider]  losartan (COZAAR) 50 MG tablet Take 50 mg by mouth daily.    [provider]  metoprolol succinate (TOPROL-XL) 50 MG 24 hr tablet Take 50 mg by mouth daily. Take with or immediately following a meal.    [provider]  Multiple Vitamins-Minerals (ICAPS AREDS FORMULA PO) Take 1 capsule by mouth 2 (two) times daily.     [provider]  Rivaroxaban (XARELTO) 20 MG TABS Take 1 tablet by mouth daily with supper.     [provider]    Physical Exam: Vitals:   11/10/19 1530 11/10/19 1541 11/10/19 1545 11/10/19 1600  BP: (!) 180/105 (!) 152/92 (!) 139/93 (!) 155/97  Pulse:  (!) 119    Resp: (!) 28 (!) 28 16 (!) 21  Temp:      TempSrc:      SpO2: 95% 95%    Weight:      Height:        Constitutional: Not in apparent distress, pleasant elderly female Vitals:   11/10/19 1530 11/10/19 1541 11/10/19 1545 11/10/19 1600  BP: (!) 180/105 (!) 152/92 (!) 139/93 (!) 155/97  Pulse:  (!) 119    Resp: (!) 28 (!) 28 16 (!) 21  Temp:      TempSrc:      SpO2: 95% 95%    Weight:      Height:       Eyes: PERRL, lids and conjunctivae normal ENMT: Mucous membranes are moist.  Neck: normal, supple, no masses, no thyromegaly Respiratory: clear to  auscultation bilaterally, no wheezing, no crackles. Normal respiratory effort. No accessory muscle use.  Cardiovascular: A. fib with RVR, no murmurs / rubs / gallops. No extremity edema.  Abdomen: no tenderness, no masses palpated. No hepatosplenomegaly. Bowel sounds positive.  Musculoskeletal: no clubbing / cyanosis.  Right lower extremity externally rotated, tenderness on the right hip, decreased range of motion , chronic bilateral lower extremity edema. skin: no rashes, lesions, ulcers. No induration Neurologic: CN 2-12 grossly intact.  Strength 5/5 in all 4.  Foley Catheter:None  Labs on Admission: I have personally reviewed following labs and imaging studies  CBC: Recent Labs  Lab 11/10/19 1536  WBC 18.3*  NEUTROABS  16.4*  HGB 12.6  HCT 41.2  MCV 93.0  PLT 962   Basic Metabolic Panel: No results for input(s): NA, K, CL, CO2, GLUCOSE, BUN, CREATININE, CALCIUM, MG, PHOS in the last 168 hours. GFR: CrCl cannot be calculated (Patient's most recent lab result is older than the maximum 21 days allowed.). Liver Function Tests: No results for input(s): AST, ALT, ALKPHOS, BILITOT, PROT, ALBUMIN in the last 168 hours. No results for input(s): LIPASE, AMYLASE in the last 168 hours. No results for input(s): AMMONIA in the last 168 hours. Coagulation Profile: Recent Labs  Lab 11/10/19 1536  INR 1.2   Cardiac Enzymes: No results for input(s): CKTOTAL, CKMB, CKMBINDEX, TROPONINI in the last 168 hours. BNP (last 3 results) No results for input(s): PROBNP in the last 8760 hours. HbA1C: No results for input(s): HGBA1C in the last 72 hours. CBG: No results for input(s): GLUCAP in the last 168 hours. Lipid Profile: No results for input(s): CHOL, HDL, LDLCALC, TRIG, CHOLHDL, LDLDIRECT in the last 72 hours. Thyroid Function Tests: No results for input(s): TSH, T4TOTAL, FREET4, T3FREE, THYROIDAB in the last 72 hours. Anemia Panel: No results for input(s): VITAMINB12, FOLATE, FERRITIN,  TIBC, IRON, RETICCTPCT in the last 72 hours. Urine analysis: No results found for: COLORURINE, APPEARANCEUR, [place], [place], GLUCOSEU, HGBUR, BILIRUBINUR, KETONESUR, PROTEINUR, UROBILINOGEN, NITRITE, LEUKOCYTESUR  Radiological Exams on Admission: DG Hip Port [place] W or [place] Pelvis 1 View Right  Result Date: 11/10/2019 CLINICAL DATA:  Fall, right hip EXAM: DG HIP (WITH OR WITHOUT PELVIS) 1V PORT RIGHT COMPARISON:  None. FINDINGS: There is a right femoral intertrochanteric fracture. Slight varus angulation. No subluxation or dislocation. IMPRESSION: Right intertrochanteric fracture with slight varus angulation. Electronically Signed   By: Rolm Baptise M.D.   On: 11/10/2019 15:25     Assessment/Plan Principal Problem:   Closed intertrochanteric fracture of right hip (HCC) Active Problems:   HYPERTENSION, BENIGN   Atrial fibrillation (HCC)   Right hip fracture: X-ray showed right intertrochanteric fracture with slight varus angulation.  Orthopedics consulted.  Planning for ORIF tomorrow at [facility].  Continue pain management. Xarelto will be hold for procedure tomorrow.  Continue SCD for new devices for now.  PT/OT evaluation after surgery. Continue bowel regimen.  Permanent A. fib: On Xarelto for anticoagulation.  On metoprolol for rate control.  She was in afib with rvr with rate ranging from 105-120 in the ED on presentation ,most likely secondary to her pain.  Will resume metoprolol.  If metoprolol does not control the rate, will consider Cardizem drip.  Hypertension: Hypertensive on presentation.  Again, this could be contributed due to her pain.  Focus on pain control, will resume losartan and metoprolol.  Diabetes type 2: Takes glimepiride at home.  Continue sliding scale insulin here.  Monitor CBGs  Leukocytosis: Most likely reactive.  No signs of infectious etiology.  UA pending.  History of dysphagia: Follows with Dr. Michail Sermon.  Has history of dilation procedures in the past.  Will  request for speech therapy evaluation.  Continue full liquid diet for today.  Abnormal chest x-ray: Noted oxygen at home, saturating well on room air earlier but during my evaluation she was on 2 L/min.  No obvious wheezing or crackles on examination.  Chest x-ray showed mixed interstitial opacities throughout the lungs suggesting congestive heart failure/volume overload.  Low suspicion for pneumonia.  She also has chronic bilateral lower extremity edema.  I will get an echocardiogram, will give her a dose of lasix iv 20 mg once  and will start on Lasix 20 mg daily for now.   Severity of Illness: The appropriate patient status for this patient is INPATIENT. I   DVT prophylaxis: SCD Code Status: Full Family Communication:: Discussed with son at bedside Consults called: Orthopedics     Burnadette Pop MD [facility]  11/10/2019, 4:24 PM

## 2019-11-10 NOTE — ED Notes (Signed)
Patient called out requesting for more pain medication.   Trifan, EDP made aware

## 2019-11-10 NOTE — ED Notes (Signed)
PER TRIFAN, EDP ortho doc wants her transferred to [facility] so he can operate on her tomorrow there. NPO at midnight. I signed out to the hospitalist, he knows about the transfer to [facility].

## 2019-11-10 NOTE — ED Notes (Addendum)
Patient arrived [facility] after fall in her yard picking up sticks.  Fell on her bottom.  Does take Xarelto for afib, denies that she hit her head when she fell.  Per patient, she sat in the yard on her bottom for a bit and then used her cell phone to call for help. Right hip pain was 8/10 with EMS.  Patient given 100 mcg fentanyl with EMS, pain decreased to 5/10 and then 2/10.  IV placed by EMS 20g rt hand.

## 2019-11-11 ENCOUNTER — Inpatient Hospital Stay (HOSPITAL_COMMUNITY): Payer: Medicare Other

## 2019-11-11 ENCOUNTER — Encounter (HOSPITAL_COMMUNITY)
Admission: EM | Disposition: A | Discharge status: Skilled Nursing Facility | Payer: Self-pay | Source: Home / Self Care | Attending: Internal Medicine

## 2019-11-11 ENCOUNTER — Other Ambulatory Visit: Payer: Self-pay

## 2019-11-11 ENCOUNTER — Inpatient Hospital Stay (HOSPITAL_COMMUNITY): Payer: Medicare Other | Admitting: Certified Registered"

## 2019-11-11 ENCOUNTER — Encounter (HOSPITAL_COMMUNITY): Payer: Self-pay | Admitting: Internal Medicine

## 2019-11-11 DIAGNOSIS — I5031 Acute diastolic (congestive) heart failure: Secondary | ICD-10-CM

## 2019-11-11 DIAGNOSIS — I10 Essential (primary) hypertension: Secondary | ICD-10-CM

## 2019-11-11 DIAGNOSIS — I4821 Permanent atrial fibrillation: Secondary | ICD-10-CM

## 2019-11-11 DIAGNOSIS — D72829 Elevated white blood cell count, unspecified: Secondary | ICD-10-CM

## 2019-11-11 HISTORY — PX: INTRAMEDULLARY (IM) NAIL INTERTROCHANTERIC: SHX5875

## 2019-11-11 LAB — ECHOCARDIOGRAM COMPLETE
Height: 65 in
Weight: 2038.81 oz

## 2019-11-11 LAB — GLUCOSE, CAPILLARY
Glucose-Capillary: 172 mg/dL — ABNORMAL HIGH (ref 70–99)
Glucose-Capillary: 132 mg/dL — ABNORMAL HIGH (ref 70–99)
Glucose-Capillary: 93 mg/dL (ref 70–99)
Glucose-Capillary: 209 mg/dL — ABNORMAL HIGH (ref 70–99)
Glucose-Capillary: 223 mg/dL — ABNORMAL HIGH (ref 70–99)

## 2019-11-11 SURGERY — FIXATION, FRACTURE, INTERTROCHANTERIC, WITH INTRAMEDULLARY ROD
Anesthesia: General | Site: Leg Upper | Laterality: Right

## 2019-11-11 MED ORDER — ESMOLOL HCL 100 MG/10ML IV SOLN
INTRAVENOUS | Status: DC | PRN
  Administered 2019-11-11 (×2): 10 mg via INTRAVENOUS

## 2019-11-11 MED ORDER — CLINDAMYCIN PHOSPHATE 600 MG/50ML IV SOLN
600.0000 mg | Freq: Four times a day (QID) | INTRAVENOUS | Status: AC
  Administered 2019-11-11 – 2019-11-12 (×3): 600 mg via INTRAVENOUS
  Filled 2019-11-11 (×3): qty 50

## 2019-11-11 MED ORDER — DEXAMETHASONE SODIUM PHOSPHATE 10 MG/ML IJ SOLN
INTRAMUSCULAR | Status: AC
  Filled 2019-11-11: qty 1

## 2019-11-11 MED ORDER — ESMOLOL HCL 100 MG/10ML IV SOLN
INTRAVENOUS | Status: AC
  Filled 2019-11-11: qty 10

## 2019-11-11 MED ORDER — PHENYLEPHRINE 40 MCG/ML (10ML) SYRINGE FOR IV PUSH (FOR BLOOD PRESSURE SUPPORT)
PREFILLED_SYRINGE | INTRAVENOUS | Status: DC | PRN
  Administered 2019-11-11 (×4): 80 ug via INTRAVENOUS

## 2019-11-11 MED ORDER — ONDANSETRON HCL 4 MG/2ML IJ SOLN
INTRAMUSCULAR | Status: AC
  Filled 2019-11-11: qty 2

## 2019-11-11 MED ORDER — LACTATED RINGERS IV SOLN: INTRAVENOUS | Status: DC | PRN

## 2019-11-11 MED ORDER — METOCLOPRAMIDE HCL 5 MG/ML IJ SOLN: 5.0000 mg | Freq: Three times a day (TID) | INTRAMUSCULAR | Status: DC | PRN

## 2019-11-11 MED ORDER — FENTANYL CITRATE (PF) 100 MCG/2ML IJ SOLN
INTRAMUSCULAR | Status: AC
  Filled 2019-11-11: qty 2

## 2019-11-11 MED ORDER — PHENYLEPHRINE HCL-NACL 10-0.9 MG/250ML-% IV SOLN
INTRAVENOUS | Status: DC | PRN
  Administered 2019-11-11: 30 ug/min via INTRAVENOUS

## 2019-11-11 MED ORDER — CEFAZOLIN SODIUM 1 G IJ SOLR
INTRAMUSCULAR | Status: AC
  Filled 2019-11-11: qty 20

## 2019-11-11 MED ORDER — CLINDAMYCIN PHOSPHATE 900 MG/50ML IV SOLN
900.0000 mg | INTRAVENOUS | Status: AC
  Administered 2019-11-11: 900 mg via INTRAVENOUS
  Filled 2019-11-11 (×2): qty 50

## 2019-11-11 MED ORDER — ROCURONIUM BROMIDE 50 MG/5ML IV SOSY
PREFILLED_SYRINGE | INTRAVENOUS | Status: DC | PRN
  Administered 2019-11-11: 50 mg via INTRAVENOUS

## 2019-11-11 MED ORDER — LIDOCAINE 2% (20 MG/ML) 5 ML SYRINGE
INTRAMUSCULAR | Status: DC | PRN
  Administered 2019-11-11: 60 mg via INTRAVENOUS

## 2019-11-11 MED ORDER — MIDAZOLAM HCL 2 MG/2ML IJ SOLN
INTRAMUSCULAR | Status: AC
  Filled 2019-11-11: qty 2

## 2019-11-11 MED ORDER — SUGAMMADEX SODIUM 200 MG/2ML IV SOLN
INTRAVENOUS | Status: DC | PRN
  Administered 2019-11-11: 100 mg via INTRAVENOUS

## 2019-11-11 MED ORDER — ONDANSETRON HCL 4 MG/2ML IJ SOLN: 4.0000 mg | Freq: Once | INTRAMUSCULAR | Status: DC | PRN

## 2019-11-11 MED ORDER — ONDANSETRON HCL 4 MG/2ML IJ SOLN: 4.0000 mg | Freq: Four times a day (QID) | INTRAMUSCULAR | Status: DC | PRN

## 2019-11-11 MED ORDER — CHLORHEXIDINE GLUCONATE 4 % EX LIQD
60.0000 mL | Freq: Once | CUTANEOUS | Status: AC
  Administered 2019-11-11: 4 via TOPICAL
  Filled 2019-11-11: qty 60

## 2019-11-11 MED ORDER — PROPOFOL 500 MG/50ML IV EMUL
INTRAVENOUS | Status: DC | PRN
  Administered 2019-11-11: 10 ug/kg/min via INTRAVENOUS

## 2019-11-11 MED ORDER — 0.9 % SODIUM CHLORIDE (POUR BTL) OPTIME
TOPICAL | Status: DC | PRN
  Administered 2019-11-11: 1000 mL

## 2019-11-11 MED ORDER — FENTANYL CITRATE (PF) 250 MCG/5ML IJ SOLN
INTRAMUSCULAR | Status: AC
  Filled 2019-11-11: qty 5

## 2019-11-11 MED ORDER — DOCUSATE SODIUM 100 MG PO CAPS
100.0000 mg | ORAL_CAPSULE | Freq: Two times a day (BID) | ORAL | Status: DC
  Administered 2019-11-11 – 2019-11-18 (×9): 100 mg via ORAL
  Filled 2019-11-11 (×12): qty 1

## 2019-11-11 MED ORDER — PROPOFOL 10 MG/ML IV BOLUS
INTRAVENOUS | Status: AC
  Filled 2019-11-11: qty 20

## 2019-11-11 MED ORDER — LIDOCAINE 2% (20 MG/ML) 5 ML SYRINGE
INTRAMUSCULAR | Status: AC
  Filled 2019-11-11: qty 5

## 2019-11-11 MED ORDER — ROCURONIUM BROMIDE 10 MG/ML (PF) SYRINGE
PREFILLED_SYRINGE | INTRAVENOUS | Status: AC
  Filled 2019-11-11: qty 10

## 2019-11-11 MED ORDER — DEXAMETHASONE SODIUM PHOSPHATE 10 MG/ML IJ SOLN
INTRAMUSCULAR | Status: DC | PRN
  Administered 2019-11-11: 10 mg via INTRAVENOUS

## 2019-11-11 MED ORDER — POVIDONE-IODINE 10 % EX SWAB
2.0000 "application " | Freq: Once | CUTANEOUS | Status: AC
  Administered 2019-11-11: 2 via TOPICAL

## 2019-11-11 MED ORDER — KETOROLAC TROMETHAMINE 15 MG/ML IJ SOLN
15.0000 mg | Freq: Once | INTRAMUSCULAR | Status: AC | PRN
  Administered 2019-11-11: 15 mg via INTRAVENOUS

## 2019-11-11 MED ORDER — ONDANSETRON HCL 4 MG/2ML IJ SOLN
INTRAMUSCULAR | Status: DC | PRN
  Administered 2019-11-11: 4 mg via INTRAVENOUS

## 2019-11-11 MED ORDER — KETOROLAC TROMETHAMINE 15 MG/ML IJ SOLN
INTRAMUSCULAR | Status: AC
  Filled 2019-11-11: qty 1

## 2019-11-11 MED ORDER — FENTANYL CITRATE (PF) 250 MCG/5ML IJ SOLN
INTRAMUSCULAR | Status: DC | PRN
  Administered 2019-11-11 (×2): 50 ug via INTRAVENOUS

## 2019-11-11 MED ORDER — PROPOFOL 10 MG/ML IV BOLUS
INTRAVENOUS | Status: DC | PRN
  Administered 2019-11-11: 150 mg via INTRAVENOUS

## 2019-11-11 MED ORDER — METOCLOPRAMIDE HCL 5 MG PO TABS: 5.0000 mg | ORAL_TABLET | Freq: Three times a day (TID) | ORAL | Status: DC | PRN

## 2019-11-11 MED ORDER — ONDANSETRON HCL 4 MG PO TABS
4.0000 mg | ORAL_TABLET | Freq: Four times a day (QID) | ORAL | Status: DC | PRN
  Administered 2019-11-12: 4 mg via ORAL
  Filled 2019-11-11: qty 1

## 2019-11-11 MED ORDER — PROPOFOL 1000 MG/100ML IV EMUL
INTRAVENOUS | Status: AC
  Filled 2019-11-11: qty 100

## 2019-11-11 MED ORDER — FENTANYL CITRATE (PF) 100 MCG/2ML IJ SOLN
25.0000 ug | INTRAMUSCULAR | Status: DC | PRN
  Administered 2019-11-11 (×2): 25 ug via INTRAVENOUS

## 2019-11-11 SURGICAL SUPPLY — 42 items
BIT DRILL CANN LG 4.3MM (BIT) IMPLANT
BRUSH SCRUB EZ PLAIN DRY (MISCELLANEOUS) ×2 IMPLANT
CHLORAPREP W/TINT 26 (MISCELLANEOUS) ×2 IMPLANT
COVER SURGICAL LIGHT HANDLE (MISCELLANEOUS) ×4 IMPLANT
COVER WAND RF STERILE (DRAPES) ×2 IMPLANT
DERMABOND ADVANCED (GAUZE/BANDAGES/DRESSINGS) ×1
DERMABOND ADVANCED .7 DNX12 (GAUZE/BANDAGES/DRESSINGS) ×1 IMPLANT
DRAPE C-ARMOR (DRAPES) IMPLANT
DRAPE HALF SHEET 40X57 (DRAPES) ×4 IMPLANT
DRAPE IMP U-DRAPE 54X76 (DRAPES) ×4 IMPLANT
DRAPE STERI IOBAN 125X83 (DRAPES) ×2 IMPLANT
DRAPE SURG 17X23 STRL (DRAPES) ×2 IMPLANT
DRAPE U-SHAPE 47X51 STRL (DRAPES) ×2 IMPLANT
DRILL BIT CANN LG 4.3MM (BIT) ×2
DRSG MEPILEX BORDER 4X4 (GAUZE/BANDAGES/DRESSINGS) ×2 IMPLANT
DRSG MEPILEX BORDER 4X8 (GAUZE/BANDAGES/DRESSINGS) ×1 IMPLANT
ELECT REM PT RETURN 9FT ADLT (ELECTROSURGICAL) ×2
ELECTRODE REM PT RTRN 9FT ADLT (ELECTROSURGICAL) ×1 IMPLANT
GLOVE BIOGEL M STRL SZ7.5 (GLOVE) ×8 IMPLANT
GLOVE BIOGEL PI IND STRL 8 (GLOVE) ×1 IMPLANT
GLOVE BIOGEL PI INDICATOR 8 (GLOVE) ×1
GOWN STRL REUS W/ TWL LRG LVL3 (GOWN DISPOSABLE) ×2 IMPLANT
GOWN STRL REUS W/ TWL XL LVL3 (GOWN DISPOSABLE) ×1 IMPLANT
GOWN STRL REUS W/TWL LRG LVL3 (GOWN DISPOSABLE) ×4
GOWN STRL REUS W/TWL XL LVL3 (GOWN DISPOSABLE) ×2
GUIDEPIN 3.2X17.5 THRD DISP (PIN) ×1 IMPLANT
KIT BASIN OR (CUSTOM PROCEDURE TRAY) ×2 IMPLANT
KIT TURNOVER KIT B (KITS) ×2 IMPLANT
MANIFOLD NEPTUNE II (INSTRUMENTS) ×2 IMPLANT
NAIL HIP FRACT 130D 11X180 (Screw) ×1 IMPLANT
NS IRRIG 1000ML POUR BTL (IV SOLUTION) ×2 IMPLANT
PACK GENERAL/GYN (CUSTOM PROCEDURE TRAY) ×2 IMPLANT
PAD ARMBOARD 7.5X6 YLW CONV (MISCELLANEOUS) ×4 IMPLANT
SCREW BONE CORTICAL 5.0X3 (Screw) ×1 IMPLANT
SCREW LAG 10.5MMX105MM HFN (Screw) ×1 IMPLANT
STAPLER VISISTAT 35W (STAPLE) ×2 IMPLANT
SUT MNCRL AB 3-0 PS2 18 (SUTURE) ×2 IMPLANT
SUT VIC AB 2-0 CT1 27 (SUTURE) ×2
SUT VIC AB 2-0 CT1 TAPERPNT 27 (SUTURE) ×1 IMPLANT
TOWEL GREEN STERILE (TOWEL DISPOSABLE) ×4 IMPLANT
TOWEL GREEN STERILE FF (TOWEL DISPOSABLE) ×2 IMPLANT
WATER STERILE IRR 1000ML POUR (IV SOLUTION) ×2 IMPLANT

## 2019-11-11 NOTE — Anesthesia Procedure Notes (Signed)
Procedure Name: Intubation Date/Time: 11/11/2019 11:20 AM Performed by: Griffin Dakin, CRNA Pre-anesthesia Checklist: Patient identified, Emergency Drugs available, Suction available and Patient being monitored Patient Re-evaluated:Patient Re-evaluated prior to induction Oxygen Delivery Method: Circle system utilized Preoxygenation: Pre-oxygenation with 100% oxygen Induction Type: IV induction Ventilation: Mask ventilation without difficulty Laryngoscope Size: Mac and 3 Tube type: Oral Tube size: 7.0 mm Number of attempts: 1 Airway Equipment and Method: Stylet and Oral airway Placement Confirmation: ETT inserted through vocal cords under direct vision,  positive ETCO2 and breath sounds checked- equal and bilateral Secured at: 23 cm Tube secured with: Tape Dental Injury: Teeth and Oropharynx as per pre-operative assessment

## 2019-11-11 NOTE — Transfer of Care (Signed)
Immediate Anesthesia Transfer of Care Note  Patient: Jasmine Jenkins  Procedure(s) Performed: INTRAMEDULLARY (IM) NAIL INTERTROCHANTRIC (Right Leg Upper)  Patient Location: PACU  Anesthesia Type:General  Level of Consciousness: drowsy  Airway & Oxygen Therapy: Patient Spontanous Breathing and Patient connected to face mask oxygen  Post-op Assessment: Report given to RN and Post -op Vital signs reviewed and stable  Post vital signs: Reviewed and stable  Last Vitals:  Vitals Value Taken Time  BP 109/68 11/11/19 1222  Temp    Pulse 85 11/11/19 1227  Resp 18 11/11/19 1227  SpO2 100 % 11/11/19 1227  Vitals shown include unvalidated device data.  Last Pain:  Vitals:   11/11/19 1000  TempSrc:   PainSc: 2          Complications: No apparent anesthesia complications

## 2019-11-11 NOTE — Plan of Care (Signed)
  Problem: Pain Management: Goal: Pain level will decrease Outcome: Progressing   Problem: Activity: Goal: Risk for activity intolerance will decrease Outcome: Progressing   Problem: Safety: Goal: Ability to remain free from injury will improve Outcome: Progressing   Problem: Education: Goal: Knowledge of General Education information will improve Description: Including pain rating scale, medication(s)/side effects and non-pharmacologic comfort measures Outcome: Progressing   Problem: Activity: Goal: Risk for activity intolerance will decrease Outcome: Progressing   Problem: Coping: Goal: Level of anxiety will decrease Outcome: Progressing

## 2019-11-11 NOTE — Consult Note (Signed)
Reason for Consult: Right intertrochanteric hip fracture Referring Physician: [facility]  Jasmine Jenkins is an 86 y.o. female.  HPI: Patient sustained a fall and was brought to the emergency department with right hip pain.  She was diagnosed with a right intertrochanteric hip fracture.  Orthopedics was consulted.  X-rays revealed a displaced intertrochanteric hip fracture and given her ambulatory status she is indicated for surgery.  She complains of pain in the right hip.  She has some discomfort that is mild in the right knee and and reports baseline lower extremity swelling with some just generalized tenderness in her legs.  This has not changed since her fall.  She denies symptomatic numbness or tingling.  Denies left lower extremity or bilateral upper extremity injury.  Denies recent fevers or chills.  Past Medical History:  Diagnosis Date  . Arthritis   . Chronic back pain   . Chronic kidney disease   . Diabetes ([place])   . Diverticulosis   . GERD (gastroesophageal reflux disease)   . H/O hiatal hernia   . Hypertension   . Lower extremity edema   . Mild pulmonary hypertension ([place])   . Peripheral vascular disease ([place])   . Persistent atrial fibrillation ([place])   . PONV (postoperative nausea and vomiting)    when she is completely put under  . Renal artery stenosis ([place])   . Streptococcal sepsis [facility])     Past Surgical History:  Procedure Laterality Date  . BACK SURGERY    . BALLOON DILATION N/A 09/27/2017   Procedure: BALLOON DILATION;  Surgeon: Wilford Corner, MD;  Location: [facility] ENDOSCOPY;  Service: Endoscopy;  Laterality: N/A;  . CARDIAC CATHETERIZATION    . CARDIOVERSION  11/29/2011   Procedure: CARDIOVERSION;  Surgeon: Jettie Booze, MD;  Location: [place];  Service: Cardiovascular;  Laterality: N/A;  . CARDIOVERSION N/A 10/02/2012   Procedure: CARDIOVERSION;  Surgeon: Jettie Booze, MD;  Location: [place];  Service: Cardiovascular;   Laterality: N/A;  . ESOPHAGOGASTRODUODENOSCOPY (EGD) WITH PROPOFOL N/A 08/24/2016   Procedure: ESOPHAGOGASTRODUODENOSCOPY (EGD) WITH PROPOFOL;  Surgeon: Teena Irani, MD;  Location: [place];  Service: Endoscopy;  Laterality: N/A;  . ESOPHAGOGASTRODUODENOSCOPY (EGD) WITH PROPOFOL N/A 09/27/2017   Procedure: ESOPHAGOGASTRODUODENOSCOPY (EGD) WITH PROPOFOL;  Surgeon: Wilford Corner, MD;  Location: [place];  Service: Endoscopy;  Laterality: N/A;  . HERNIA REPAIR    . SAVORY DILATION N/A 08/24/2016   Procedure: SAVORY DILATION;  Surgeon: Teena Irani, MD;  Location: [facility] ENDOSCOPY;  Service: Endoscopy;  Laterality: N/A;  . SAVORY DILATION N/A 09/27/2017   Procedure: SAVORY DILATION;  Surgeon: Wilford Corner, MD;  Location: [place];  Service: Endoscopy;  Laterality: N/A;    Family History  Problem Relation Age of Onset  . COPD Brother     Social History:  reports that she quit smoking about 62 years ago. She has never used smokeless tobacco. She reports that she does not drink alcohol or use drugs.  Allergies:  Allergies  Allergen Reactions  . Penicillins Anaphylaxis  . Codeine     REACTION: GI Intolerance  . Hydrocodone-Acetaminophen     REACTION: GI Intolerance    Medications: I have reviewed the patient's current medications.  Results for orders placed or performed during the hospital encounter of 11/10/19 (from the past 48 hour(s))  Basic metabolic panel     Status: Abnormal   Collection Time: 11/10/19  3:36 PM  Result Value Ref Range   Sodium 133 (L) 135 - 145 mmol/L  Potassium 4.9 3.5 - 5.1 mmol/L   Chloride 101 98 - 111 mmol/L   CO2 23 22 - 32 mmol/L   Glucose, Bld 259 (H) 70 - 99 mg/dL    Comment: Glucose reference range applies only to samples taken after fasting for at least 8 hours.   BUN 19 8 - 23 mg/dL   Creatinine, Ser 6.96 0.44 - 1.00 mg/dL   Calcium 8.9 8.9 - 29.5 mg/dL   GFR calc non Af Amer >60 >60 mL/min   GFR calc Af Amer >60 >60 mL/min   Anion  gap 9 5 - 15    Comment: Performed at [facility], [address], [place], [place] [address]  CBC with Differential     Status: Abnormal   Collection Time: 11/10/19  3:36 PM  Result Value Ref Range   WBC 18.3 (H) 4.0 - 10.5 K/uL   RBC 4.43 3.87 - 5.11 MIL/uL   Hemoglobin 12.6 12.0 - 15.0 g/dL   HCT 24.4 01.0 - 27.2 %   MCV 93.0 80.0 - 100.0 fL   MCH 28.4 26.0 - 34.0 pg   MCHC 30.6 30.0 - 36.0 g/dL   RDW 53.6 64.4 - 03.4 %   Platelets 169 150 - 400 K/uL   nRBC 0.0 0.0 - 0.2 %   Neutrophils Relative % 90 %   Neutro Abs 16.4 (H) 1.7 - 7.7 K/uL   Lymphocytes Relative 5 %   Lymphs Abs 0.9 0.7 - 4.0 K/uL   Monocytes Relative 4 %   Monocytes Absolute 0.8 0.1 - 1.0 K/uL   Eosinophils Relative 0 %   Eosinophils Absolute 0.1 0.0 - 0.5 K/uL   Basophils Relative 0 %   Basophils Absolute 0.0 0.0 - 0.1 K/uL   Immature Granulocytes 1 %   Abs Immature Granulocytes 0.10 (H) 0.00 - 0.07 K/uL    Comment: Performed at [facility], [address] [place], [place] [address]  Protime-INR     Status: Abnormal   Collection Time: 11/10/19  3:36 PM  Result Value Ref Range   Prothrombin Time 15.4 (H) 11.4 - 15.2 seconds   INR 1.2 0.8 - 1.2    Comment: (NOTE) INR goal varies based on device and disease states. Performed at [facility], [address] [place], [place] [address]   Brain natriuretic peptide     Status: Abnormal   Collection Time: 11/10/19  3:36 PM  Result Value Ref Range   B Natriuretic Peptide 239.6 (H) 0.0 - 100.0 pg/mL    Comment: Performed at [facility], [address], [place], [place] [address]  Respiratory Panel by RT PCR (Flu A&B, Covid) - Nasopharyngeal Swab     Status: None   Collection Time: 11/10/19  3:47 PM   Specimen: Nasopharyngeal Swab  Result Value Ref Range   SARS Coronavirus 2 by RT PCR NEGATIVE NEGATIVE    Comment: (NOTE) SARS-CoV-2 target nucleic acids are NOT DETECTED. The  SARS-CoV-2 RNA is generally detectable in upper respiratoy specimens during the acute phase of infection. The lowest concentration of SARS-CoV-2 viral copies this assay can detect is 131 copies/mL. A negative result does not preclude SARS-Cov-2 infection and should not be used as the sole basis for treatment or other patient management decisions. A negative result may occur with  improper specimen collection/handling, submission of specimen other than nasopharyngeal swab, presence of viral mutation(s) within the areas targeted by this assay, and inadequate number of viral copies (<  131 copies/mL). A negative result must be combined with clinical observations, patient history, and epidemiological information. The expected result is Negative. Fact Sheet for Patients:  https://www.moore.com/ Fact Sheet for Healthcare Providers:  https://www.young.biz/ This test is not yet ap proved or cleared by the [place] FDA and  has been authorized for detection and/or diagnosis of SARS-CoV-2 by FDA under an Emergency Use Authorization (EUA). This EUA will remain  in effect (meaning this test can be used) for the duration of the COVID-19 declaration under Section 564(b)(1) of the Act, 21 U.S.C. section 360bbb-3(b)(1), unless the authorization is terminated or revoked sooner.    Influenza A by PCR NEGATIVE NEGATIVE   Influenza B by PCR NEGATIVE NEGATIVE    Comment: (NOTE) The Xpert Xpress SARS-CoV-2/FLU/RSV assay is intended as an aid in  the diagnosis of influenza from Nasopharyngeal swab specimens and  should not be used as a sole basis for treatment. Nasal washings and  aspirates are unacceptable for Xpert Xpress SARS-CoV-2/FLU/RSV  testing. Fact Sheet for Patients: https://www.moore.com/ Fact Sheet for Healthcare Providers: https://www.young.biz/ This test is not yet approved or cleared by the [place] FDA  and  has been authorized for detection and/or diagnosis of SARS-CoV-2 by  FDA under an Emergency Use Authorization (EUA). This EUA will remain  in effect (meaning this test can be used) for the duration of the  Covid-19 declaration under Section 564(b)(1) of the Act, 21  U.S.C. section 360bbb-3(b)(1), unless the authorization is  terminated or revoked. Performed at [facility], [address] [place], [place] [address]   CBG monitoring, ED     Status: Abnormal   Collection Time: 11/10/19  4:32 PM  Result Value Ref Range   Glucose-Capillary 241 (H) 70 - 99 mg/dL    Comment: Glucose reference range applies only to samples taken after fasting for at least 8 hours.  Urinalysis, Routine w reflex microscopic     Status: Abnormal   Collection Time: 11/10/19  6:44 PM  Result Value Ref Range   Color, Urine YELLOW YELLOW   APPearance CLEAR CLEAR   Specific Gravity, Urine 1.012 1.005 - 1.030   pH 6.0 5.0 - 8.0   Glucose, UA >=500 (A) NEGATIVE mg/dL   Hgb urine dipstick NEGATIVE NEGATIVE   Bilirubin Urine NEGATIVE NEGATIVE   Ketones, ur NEGATIVE NEGATIVE mg/dL   Protein, ur NEGATIVE NEGATIVE mg/dL   Nitrite NEGATIVE NEGATIVE   Leukocytes,Ua NEGATIVE NEGATIVE   RBC / HPF 0-5 0 - 5 RBC/hpf   WBC, UA 0-5 0 - 5 WBC/hpf   Bacteria, UA NONE SEEN NONE SEEN    Comment: Performed at [facility], [address], [place], [place] [address]  Surgical pcr screen     Status: None   Collection Time: 11/10/19  9:27 PM   Specimen: Nasal Mucosa; Nasal Swab  Result Value Ref Range   MRSA, PCR NEGATIVE NEGATIVE   Staphylococcus aureus NEGATIVE NEGATIVE    Comment: (NOTE) The Xpert SA Assay (FDA approved for NASAL specimens in patients 43 years of age and older), is one component of a comprehensive surveillance program. It is not intended to diagnose infection nor to guide or monitor treatment. Performed at [facility], [address] [place],  [place] [address]   Glucose, capillary     Status: Abnormal   Collection Time: 11/10/19  9:59 PM  Result Value Ref Range   Glucose-Capillary 187 (H) 70 - 99 mg/dL    Comment: Glucose reference  range applies only to samples taken after fasting for at least 8 hours.  Glucose, capillary     Status: Abnormal   Collection Time: 11/11/19  6:37 AM  Result Value Ref Range   Glucose-Capillary 172 (H) 70 - 99 mg/dL    Comment: Glucose reference range applies only to samples taken after fasting for at least 8 hours.    DG CHEST PORT 1 VIEW  Result Date: 11/10/2019 CLINICAL DATA:  Known right femur fracture, surgery planned for tomorrow EXAM: PORTABLE CHEST 1 VIEW COMPARISON:  CT 05/08/2009, esophagram 08/22/2017 FINDINGS: Diffuse mixed interstitial and airspace opacities throughout both lungs with a perihilar and peripheral predominance. Indistinct pulmonary vascularity. No visible pneumothorax or effusion. Biapical pleuroparenchymal scarring is noted. Slight prominence of the cardiomediastinal silhouette and a moderate hiatal hernia which was seen on comparison study. No acute osseous or soft tissue abnormality. Degenerative changes are present in the imaged spine and shoulders. Telemetry leads overlie the chest. IMPRESSION: Mixed interstitial airspace opacities throughout both lungs with indistinct vascularity and a prominent cardiomediastinal silhouette favor CHF/volume overload likely with some atelectasis though underlying infection is not excluded. Electronically Signed   By: Kreg ShropshirePrice  DeHay M.D.   On: 11/10/2019 17:29   DG Hip [place] 1 View Right  Result Date: 11/10/2019 CLINICAL DATA:  Fall, right hip EXAM: DG HIP (WITH OR WITHOUT PELVIS) 1V PORT RIGHT COMPARISON:  None. FINDINGS: There is a right femoral intertrochanteric fracture. Slight varus angulation. No subluxation or dislocation. IMPRESSION: Right intertrochanteric fracture with slight varus angulation. Electronically Signed   By:  Charlett NoseKevin  Dover M.D.   On: 11/10/2019 15:25   DG FEMUR, MIN 2 VIEWS RIGHT  Result Date: 11/10/2019 CLINICAL DATA:  Known right hip fracture, surgery planned for tomorrow EXAM: RIGHT FEMUR 2 VIEWS COMPARISON:  Radiograph 11/10/2019 FINDINGS: There is redemonstration of the varus angulated right intertrochanteric femur fracture. The femoral head remains normally located. The distal femur is intact. Alignment at the knee is grossly preserved albeit with moderate tricompartmental degenerative changes and meniscal chondrocalcinosis of both medial and lateral compartments as well as arthrosis with the corticated fabella posteriorly. Vascular calcium noted at the knee as well. IMPRESSION: 1. Varus angulated right intertrochanteric femur fracture. No distal femoral fractures. 2. 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 Electronically Signed   By: Kreg ShropshirePrice  DeHay M.D.   On: 11/10/2019 17:27    Review of Systems  Constitutional: Negative.   HENT: Negative.   Eyes: Negative.   Respiratory: Negative.   Cardiovascular: Negative.   Gastrointestinal: Negative.   Musculoskeletal:       Right hip pain  Skin: Negative.   Neurological: Negative.   Psychiatric/Behavioral: Negative.    Blood pressure (!) 148/62, pulse 91, temperature 98.3 F (36.8 C), temperature source Oral, resp. rate 17, height 5\' 5"  (1.651 m), weight 57.8 kg, SpO2 100 %. Physical Exam  Constitutional: She appears well-developed.  HENT:  Head: Normocephalic.  Eyes: Conjunctivae are normal.  Cardiovascular: Normal rate.  Respiratory: Effort normal.  GI: Soft.  Musculoskeletal:     Cervical back: Neck supple.     Comments: Tenderness palpation along the right hip.  No obvious skin lacerations.  Mild tenderness about the knee.  No tenderness about the thigh.  Lower extremity swelling of the right and left leg that is her baseline per her report.  Patient is able to dorsiflex and plantarflex the ankle.  Endorses  sensation light touch about the foot.  No evidence of  bilateral upper extremity injury no evidence of left lower extremity injury.  Neurological: She is alert.  Skin: Skin is warm.  Psychiatric: She has a normal mood and affect.    Assessment/Plan: Patient was admitted to the hospitalist team.  We will proceed with cephalomedullary nailing of her right intertrochanteric hip fracture.  She understands the risks, benefits and alternatives of surgery which include but not limited to wound healing complications, infection, nonunion, malunion, need for further surgery and damage to surrounding structures.  She understands the perioperative and anesthetic risk which includes death.  She understands the postoperatively she will likely be made weightbearing as tolerated and will work with physical therapy for disposition.  She does take anticoagulant for her chronic A. fib and this was held yesterday.  She would like to proceed with surgery.  Terance Hart 11/11/2019, 10:57 AM

## 2019-11-11 NOTE — Anesthesia Preprocedure Evaluation (Signed)
Anesthesia Evaluation  Patient identified by MRN, date of birth, ID band Patient awake    Reviewed: Allergy & Precautions, NPO status , Patient's Chart, lab work & pertinent test results, reviewed documented beta blocker date and time   History of Anesthesia Complications (+) PONV and history of anesthetic complications  Airway Mallampati: II  TM Distance: >3 FB Neck ROM: Full    Dental no notable dental hx.    Pulmonary former smoker,  Quit smoking 1959   Pulmonary exam normal breath sounds clear to auscultation       Cardiovascular hypertension, Pt. on medications and Pt. on home beta blockers + CAD and + Peripheral Vascular Disease  Normal cardiovascular exam+ dysrhythmias Atrial Fibrillation  Rhythm:Regular Rate:Normal     Neuro/Psych negative neurological ROS  negative psych ROS   GI/Hepatic Neg liver ROS, hiatal hernia, GERD  ,  Endo/Other  diabetes, Type 2  Renal/GU Renal InsufficiencyRenal diseaseCr 0.63, renal artery stenosis   negative genitourinary   Musculoskeletal  (+) Arthritis , Right hip fx   Abdominal Normal abdominal exam  (+)   Peds  Hematology negative hematology ROS (+) hct 41.2, plt 169   Anesthesia Other Findings   Reproductive/Obstetrics negative OB ROS                            Anesthesia Physical Anesthesia Plan  ASA: III  Anesthesia Plan: General   Post-op Pain Management:    Induction: Intravenous  PONV Risk Score and Plan: 4 or greater and Ondansetron, Dexamethasone, Treatment may vary due to age or medical condition, Propofol infusion and TIVA  Airway Management Planned: Oral ETT  Additional Equipment: None  Intra-op Plan:   Post-operative Plan: Extubation in OR  Informed Consent: I have reviewed the patients History and Physical, chart, labs and discussed the procedure including the risks, benefits and alternatives for the proposed anesthesia  with the patient or authorized representative who has indicated his/her understanding and acceptance.     Dental advisory given  Plan Discussed with: CRNA  Anesthesia Plan Comments: (Has not been of xarelto long enough for neuraxial anesthesia, will proceed with GA/ETT)        Anesthesia Quick Evaluation

## 2019-11-11 NOTE — Progress Notes (Signed)
  Echocardiogram 2D Echocardiogram has been performed.  Tye Savoy 11/11/2019, 4:01 PM

## 2019-11-11 NOTE — Progress Notes (Addendum)
Progress Note    Jasmine Jenkins  NWG:[id number] DOB: February 07, 1926  DOA: 11/10/2019 PCP: Kirby Funk, MD    Brief Narrative:     Medical records reviewed and are as summarized below:  Jasmine Jenkins is an 86 y.o. female with medical history significant of permanent A. fib on Xarelto, hypertension, diabetes type 2 who presents to the emergency department for the evaluation of right hip pain.  Patient is pretty independent and lives by herself.  She was working in her yard and she was preparing to mow her lawn when she had a mechanical fall today, followed by immediate pain and decreased mobility of her right hip.  She did not strike her head on the ground.  She did not lose any consciousness.  She was unable to ambulate after she fell.  EMS arrived and she was given 100 mcg of fentanyl en route. She follows with Dr. Johney Frame for her A. fib.  She is on Xarelto for anticoagulation and takes metoprolol for rate control.  Her son lives next door.  Assessment/Plan:   Principal Problem:   Closed intertrochanteric fracture of right hip (HCC) Active Problems:   HYPERTENSION, BENIGN   Atrial fibrillation (HCC)   Closed right hip fracture (HCC)   Hip fracture (HCC)   Right hip fracture:  -X-ray showed right intertrochanteric fracture with slight varus angulation.   -Orthopedics consulted.   -[facility].   -Continue pain management. -Xarelto will be hold for procedure--resume when okay with orthopedics - PT/OT evaluation after surgery. -Continue bowel regimen.  Permanent A. fib:  -On Xarelto for anticoagulation.   - metoprolol for rate control.   -was in afib with rvr with rate ranging from 105-120 in the ED on presentation ,most likely secondary to her pain.    Hypertension:  -Resume home meds and titrate as needed  Diabetes type 2:  -Takes glimepiride at home.   -Continue sliding scale insulin here but patient may need low-dose Lantus for better control and  healing after surgery  Leukocytosis:  -Unclear, labs ordered but not done today -Repeat labs in the a.m. -No fever and UA unremarkable  History of dysphagia: - Follows with Dr. Bosie Clos.   -Per son patient usually tolerates a regular diet in the a.m. but by the evening has more trouble and usually takes and only liquids such as Ensure -After surgery would resume a carb mod diet  Abnormal chest x-ray:  -Not on oxygen at home -Chest x-ray showed mixed interstitial opacities throughout the lungs suggesting congestive heart failure/volume overload.  - echocardiogram ordered by admitting MD but not done yet.  Do not think this should delay her surgery -Was given dose of IV Lasix: Appears patient diuresed 1.4 L -After echo and surgery will need to be reassessed for ongoing need for Lasix  Family Communication/Anticipated D/C date and plan/Code Status   DVT prophylaxis: Lovenox ordered. Code Status: Full Code.  Family Communication: Son at bedside Disposition Plan: Status is: Inpatient  Remains inpatient appropriate because:Inpatient level of care appropriate due to severity of illness and need for hip surgery   Dispo: The patient is from: Home              Anticipated d/c is to: rehab              Anticipated d/c date is: 3 days              Patient currently is not medically stable to  d/c.          Medical Consultants:    Orthopedics: Appears to be Dr. Susa Simmonds     Subjective:   Tired of laying in bed and ready to get up moving after her surgery  Objective:    Vitals:   11/10/19 2200 11/10/19 2222 11/11/19 0402 11/11/19 0734  BP:  (!) 132/103 (!) 163/98 (!) 148/62  Pulse:  60 98 91  Resp:  18 20 17   Temp:  98 F (36.7 C) (!) 97.5 F (36.4 C) 98.3 F (36.8 C)  TempSrc:  Oral Oral Oral  SpO2:  100% 97% 100%  Weight: 57.8 kg     Height: 5\' 5"  (1.651 m)       Intake/Output Summary (Last 24 hours) at 11/11/2019 1022 Last data filed at 11/11/2019 0900 Gross  per 24 hour  Intake 0 ml  Output 1400 ml  Net -1400 ml   Filed Weights   11/10/19 1435 11/10/19 2200  Weight: 57.2 kg 57.8 kg    Exam: In bed, pleasant and cooperative Alert and oriented x3 Irregular rhythm but rate controlled Diminished breath sounds anteriorly: Did not set patient up to listen due to on treated hip fracture Positive lower extremity edema Positive bowel sounds, soft, nontender  Data Reviewed:   I have personally reviewed following labs and imaging studies:  Labs: Labs show the following:   Basic Metabolic Panel: Recent Labs  Lab 11/10/19 1536  NA 133*  K 4.9  CL 101  CO2 23  GLUCOSE 259*  BUN 19  CREATININE 0.63  CALCIUM 8.9   GFR Estimated Creatinine Clearance: 41.2 mL/min (by C-G formula based on SCr of 0.63 mg/dL). Liver Function Tests: No results for input(s): AST, ALT, ALKPHOS, BILITOT, PROT, ALBUMIN in the last 168 hours. No results for input(s): LIPASE, AMYLASE in the last 168 hours. No results for input(s): AMMONIA in the last 168 hours. Coagulation profile Recent Labs  Lab 11/10/19 1536  INR 1.2    CBC: Recent Labs  Lab 11/10/19 1536  WBC 18.3*  NEUTROABS 16.4*  HGB 12.6  HCT 41.2  MCV 93.0  PLT 169   Cardiac Enzymes: No results for input(s): CKTOTAL, CKMB, CKMBINDEX, TROPONINI in the last 168 hours. BNP (last 3 results) No results for input(s): PROBNP in the last 8760 hours. CBG: Recent Labs  Lab 11/10/19 1632 11/10/19 2159 11/11/19 0637  GLUCAP 241* 187* 172*   D-Dimer: No results for input(s): DDIMER in the last 72 hours. Hgb A1c: No results for input(s): HGBA1C in the last 72 hours. Lipid Profile: No results for input(s): CHOL, HDL, LDLCALC, TRIG, CHOLHDL, LDLDIRECT in the last 72 hours. Thyroid function studies: No results for input(s): TSH, T4TOTAL, T3FREE, THYROIDAB in the last 72 hours.  Invalid input(s): FREET3 Anemia work up: No results for input(s): VITAMINB12, FOLATE, FERRITIN, TIBC, IRON,  RETICCTPCT in the last 72 hours. Sepsis Labs: Recent Labs  Lab 11/10/19 1536  WBC 18.3*    Microbiology Recent Results (from the past 240 hour(s))  Respiratory Panel by RT PCR (Flu A&B, Covid) - Nasopharyngeal Swab     Status: None   Collection Time: 11/10/19  3:47 PM   Specimen: Nasopharyngeal Swab  Result Value Ref Range Status   SARS Coronavirus 2 by RT PCR NEGATIVE NEGATIVE Final    Comment: (NOTE) SARS-CoV-2 target nucleic acids are NOT DETECTED. The SARS-CoV-2 RNA is generally detectable in upper respiratoy specimens during the acute phase of infection. The lowest concentration of SARS-CoV-2 viral copies  this assay can detect is 131 copies/mL. A negative result does not preclude SARS-Cov-2 infection and should not be used as the sole basis for treatment or other patient management decisions. A negative result may occur with  improper specimen collection/handling, submission of specimen other than nasopharyngeal swab, presence of viral mutation(s) within the areas targeted by this assay, and inadequate number of viral copies (<131 copies/mL). A negative result must be combined with clinical observations, patient history, and epidemiological information. The expected result is Negative. Fact Sheet for Patients:  PinkCheek.be Fact Sheet for Healthcare Providers:  GravelBags.it This test is not yet ap proved or cleared by the [place] FDA and  has been authorized for detection and/or diagnosis of SARS-CoV-2 by FDA under an Emergency Use Authorization (EUA). This EUA will remain  in effect (meaning this test can be used) for the duration of the COVID-19 declaration under Section 564(b)(1) of the Act, 21 U.S.C. section 360bbb-3(b)(1), unless the authorization is terminated or revoked sooner.    Influenza A by PCR NEGATIVE NEGATIVE Final   Influenza B by PCR NEGATIVE NEGATIVE Final    Comment: (NOTE) The Xpert  Xpress SARS-CoV-2/FLU/RSV assay is intended as an aid in  the diagnosis of influenza from Nasopharyngeal swab specimens and  should not be used as a sole basis for treatment. Nasal washings and  aspirates are unacceptable for Xpert Xpress SARS-CoV-2/FLU/RSV  testing. Fact Sheet for Patients: PinkCheek.be Fact Sheet for Healthcare Providers: GravelBags.it This test is not yet approved or cleared by the [place] FDA and  has been authorized for detection and/or diagnosis of SARS-CoV-2 by  FDA under an Emergency Use Authorization (EUA). This EUA will remain  in effect (meaning this test can be used) for the duration of the  Covid-19 declaration under Section 564(b)(1) of the Act, 21  U.S.C. section 360bbb-3(b)(1), unless the authorization is  terminated or revoked. Performed at [facility], [place] [address] [place], [place] [address]   Surgical pcr screen     Status: None   Collection Time: 11/10/19  9:27 PM   Specimen: Nasal Mucosa; Nasal Swab  Result Value Ref Range Status   MRSA, PCR NEGATIVE NEGATIVE Final   Staphylococcus aureus NEGATIVE NEGATIVE Final    Comment: (NOTE) The Xpert SA Assay (FDA approved for NASAL specimens in patients 78 years of age and older), is one component of a comprehensive surveillance program. It is not intended to diagnose infection nor to guide or monitor treatment. Performed at [facility], [place] [address], [place], [place] [address]     Procedures and diagnostic studies:  DG CHEST PORT 1 VIEW  Result Date: 11/10/2019 CLINICAL DATA:  Known right femur fracture, surgery planned for tomorrow EXAM: PORTABLE CHEST 1 VIEW COMPARISON:  CT 05/08/2009, esophagram 08/22/2017 FINDINGS: Diffuse mixed interstitial and airspace opacities throughout both lungs with a perihilar and peripheral predominance. Indistinct pulmonary vascularity. No visible pneumothorax or effusion.  Biapical pleuroparenchymal scarring is noted. Slight prominence of the cardiomediastinal silhouette and a moderate hiatal hernia which was seen on comparison study. No acute osseous or soft tissue abnormality. Degenerative changes are present in the imaged spine and shoulders. Telemetry leads overlie the chest. IMPRESSION: Mixed interstitial airspace opacities throughout both lungs with indistinct vascularity and a prominent cardiomediastinal silhouette favor CHF/volume overload likely with some atelectasis though underlying infection is not excluded. Electronically Signed   By: Lovena Le M.D.   On: 11/10/2019 17:29   DG Hip Port Unilat W or Wo  Pelvis 1 View Right  Result Date: 11/10/2019 CLINICAL DATA:  Fall, right hip EXAM: DG HIP (WITH OR WITHOUT PELVIS) 1V PORT RIGHT COMPARISON:  None. FINDINGS: There is a right femoral intertrochanteric fracture. Slight varus angulation. No subluxation or dislocation. IMPRESSION: Right intertrochanteric fracture with slight varus angulation. Electronically Signed   By: Charlett Nose M.D.   On: 11/10/2019 15:25   DG FEMUR, MIN 2 VIEWS RIGHT  Result Date: 11/10/2019 CLINICAL DATA:  Known right hip fracture, surgery planned for tomorrow EXAM: RIGHT FEMUR 2 VIEWS COMPARISON:  Radiograph 11/10/2019 FINDINGS: There is redemonstration of the varus angulated right intertrochanteric femur fracture. The femoral head remains normally located. The distal femur is intact. Alignment at the knee is grossly preserved albeit with moderate tricompartmental degenerative changes and meniscal chondrocalcinosis of both medial and lateral compartments as well as arthrosis with the corticated fabella posteriorly. Vascular calcium noted at the knee as well. IMPRESSION: 1. Varus angulated right intertrochanteric femur fracture. No distal femoral fractures. 2. 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 Electronically Signed   By: Kreg Shropshire M.D.   On: 11/10/2019 17:27      Medications:    furosemide  20 mg Oral Daily   insulin aspart  0-5 Units Subcutaneous QHS   insulin aspart  0-9 Units Subcutaneous TID WC   losartan  50 mg Oral Daily   metoprolol succinate  50 mg Oral Daily   polyethylene glycol  17 g Oral Daily   povidone-iodine  2 application Topical Once   senna  1 tablet Oral BID   vitamin B-12  1,000 mcg Oral Daily   Continuous Infusions:  clindamycin (CLEOCIN) IV       LOS: 1 day   Joseph Art  [facility]   How to contact the [facility] Attending or Consulting provider 7A - 7P or covering provider during after hours 7P -7A, for this patient?  1. Check the care team in [facility] and look for a) attending/consulting TRH provider listed and b) the [facility] team listed 2. Log into [email or web address] and use [place]'s universal password to access. If you do not have the password, please contact the hospital operator. 3. Locate the [facility] provider you are looking for under [facility] and page to a number that you can be directly reached. 4. If you still have difficulty reaching the provider, please page the [facility] (Director on Call) for the Hospitalists listed on amion for assistance.  11/11/2019, 10:22 AM

## 2019-11-11 NOTE — Progress Notes (Signed)
SLP Cancellation Note  Patient Details Name: Jasmine Jenkins MRN: [id number] DOB: 1926/02/15   Cancelled treatment:       Reason Eval/Treat Not Completed: Patient at procedure or test/unavailable (currently NPO pending surgery this am). Per RN, she did take sips of water with meds without overt difficulty and denies any prior dysphagia. Will f/u as able.    Mahala Menghini., M.A. CCC-SLP Acute Rehabilitation Services Pager [phone] Office [phone]  11/11/2019, 9:52 AM

## 2019-11-11 NOTE — Op Note (Signed)
Jasmine Jenkins female 86 y.o. 11/11/2019  PreOperative Diagnosis: Right intertrochanteric femur fracture  PostOperative Diagnosis: Same  PROCEDURE: Cephalomedullary fixation of right intertrochanteric femur fracture  SURGEON: Melony Overly, MD  ASSISTANT: Angela Nevin, RNFA  ANESTHESIA: General  FINDINGS: Displaced right intertrochanteric femur fracture  IMPLANTS: [employer] cephalomedullary nail, 11 mm  INDICATIONS:86 y.o. female fell and had right hip pain.  She was diagnosed with an intertrochanteric hip fracture.  Orthopedics was consulted.  She had pain in the right hip and pain nowhere else.  She is indicated for surgery given her ambulatory status.  She understood the risk benefits alternatives surgery which include but not limited to wound healing complications, infection, nonunion, malunion, need for further surgery damage to structures.  After weighing these risks he would like to proceed.  PROCEDURE:Patient was identified in the preoperative holding area and the right hip was marked by myself.  The consent was signed by myself and the patient.  This identified the right lower extremity as the appropriate operative extremity and the surgeries to be performed was operative treatment of left hip fracture. They  were taken to the operating room where general endotracheal anesthesia was induced without difficulty and then was moved supine on a Hana table.  The operative leg was placed into the traction boot.  The non-operative leg was well-padded and affixed to the right leg holder with coban.  The arm was crossed over the chest and well padded.  Preoperative antibiotics were given.  Surgical timeout was performed.  Using fluoroscopy appropriate AP and lateral x-rays of the left hip were obtained.  Then using traction through the Hana table was able to reduce the fracture fragments in a closed fashion.  Acceptable reduction was confirmed on AP and lateral x-rays.  Then the right  hip was prepped and draped in usual sterile fashion.  A shower curtain drape was placed.  We began the surgery by placing the guidepin percutaneously at the appropriate starting point at the tip of the greater trochanter.  The appropriate starting point was confirmed on AP and lateral radiograph.  Then a 3 cm incision was made about the site of the percutaneous placement of the pin and the opening reamer with a soft tissue guide was placed into the wound down to the tip of the greater trochanter and the bone was entered with the opening reamer down to the level of lesser trochanter.  Then the openning reamer and guide pin were removed.  A short 11 mm diameter nail was chosen.  This was placed without difficulty and appropriate positioning was confirmed on fluoroscopy.  Then using the blade insertion guide a second incision was made distally down the thigh to allow for placement of the cephalo-medullary screw.  The guidepin for the blade was placed up the femoral neck in the appropriate position center center in the femoral head.  Then the reamer was used to ream for the blade and the blade was placed without difficulty.  The length of the blade was a 105 mm.  Then the set screw was tightened.  Then a distal interlocking screw was placed through the guide.  Final films were taken.  The wounds were irrigated with normal saline and the incisions were closed in a layered fashion using 2-0 Monocryl and staples.  Mepilex dressings were used.  He was then transferred to the hospital bed and awakened from anesthesia without difficulty.  Counts were correct at the end the case.  There were no complications.  POST  OPERATIVE INSTRUCTIONS: Weightbearing as tolerated right lower extremity Mobilize with physical therapy Dispo per physical therapy and hospitalist team. Reinforce dressing Postoperative antibiotics  TOURNIQUET TIME: No tourniquet was used  BLOOD LOSS:  200 mL         DRAINS: none         SPECIMEN:  none       COMPLICATIONS:  * No complications entered in OR log *         Disposition: PACU - hemodynamically stable.         Condition: stable

## 2019-11-11 NOTE — Evaluation (Signed)
Physical Therapy Evaluation Patient Details Name: Asli PESTER MRN: [id number] DOB: 02/14/1926 Today's Date: 11/11/2019   History of Present Illness  86 yo female with onset of mechanical fall in her yard was admitted for IM nailing of R femoral intertrochanteric fracture.  Has been in a-fib since admission, on O2 via cannula.  PMHx:  OA, chronic back pain, DM, pulm HTN, PVD, LE edema, hiatal hernia, sepsis, renal artery stenosis,   Clinical Impression  Pt was seen for mobility on side of bed, attempted sidesteps but cannot take a step.  Pivoted on and off BSC, repositioned on bed for comfort and safety with ice replaced. Nursing contacted for pain meds.  Follow acutely to shorten stay in rehab setting, progressing steps as tolerated.    Follow Up Recommendations SNF    Equipment Recommendations  Rolling walker with 5" wheels    Recommendations for Other Services       Precautions / Restrictions Precautions Precautions: Fall Precaution Comments: monitor pulses and sats Restrictions Weight Bearing Restrictions: No      Mobility  Bed Mobility Overal bed mobility: Needs Assistance Bed Mobility: Supine to Sit;Sit to Supine     Supine to sit: Mod assist Sit to supine: Mod assist   General bed mobility comments: mod out with bed pad, back with help for trunk and RLE  Transfers Overall transfer level: Needs assistance Equipment used: Rolling walker (2 wheeled);1 person hand held assist Transfers: Sit to/from [employer] Sit to Stand: Max assist Stand pivot transfers: Max assist       General transfer comment: max assist to stand, using RW to steady bedside and used PT directly to pivot to and from [facility]  Ambulation/Gait             General Gait Details: attempted unsuccessfully to step  Stairs            Wheelchair Mobility    Modified Rankin (Stroke Patients Only)       Balance Overall balance assessment: History of Falls;Needs  assistance Sitting-balance support: Feet supported;Bilateral upper extremity supported Sitting balance-Leahy Scale: Fair     Standing balance support: Bilateral upper extremity supported;During functional activity Standing balance-Leahy Scale: Poor                               Pertinent Vitals/Pain Pain Assessment: Faces Faces Pain Scale: Hurts even more Pain Location: spine and R hip Pain Descriptors / Indicators: Operative site guarding;Grimacing;Guarding Pain Intervention(s): Limited activity within patient's tolerance;Monitored during session;Premedicated before session;Repositioned;Patient requesting pain meds-RN notified;Ice applied    Home Living Family/patient expects to be discharged to:: Private residence Living Arrangements: Alone Available Help at Discharge: Family;Available PRN/intermittently Type of Home: House Home Access: Ramped entrance     Home Layout: One level Home Equipment: None Additional Comments: has been home caring for herself, mowing grass    Prior Function Level of Independence: Independent         Comments: has some chronic pain     Hand Dominance   Dominant Hand: Right    Extremity/Trunk Assessment   Upper Extremity Assessment Upper Extremity Assessment: Overall WFL for tasks assessed    Lower Extremity Assessment Lower Extremity Assessment: RLE deficits/detail RLE Deficits / Details: new IM nailing on R hip RLE: Unable to fully assess due to pain RLE Coordination: decreased gross motor    Cervical / Trunk Assessment Cervical / Trunk Assessment: Kyphotic  Communication  Communication: No difficulties  Cognition Arousal/Alertness: Awake/alert Behavior During Therapy: WFL for tasks assessed/performed Overall Cognitive Status: Within Functional Limits for tasks assessed                                        General Comments General comments (skin integrity, edema, etc.): pt was seen for  mobility with standing bedside, then pivot with help to [facility].  Pt is unable to step, looks appropriate for SNF request    Exercises     Assessment/Plan    PT Assessment Patient needs continued PT services  PT Problem List Decreased strength;Decreased range of motion;Decreased activity tolerance;Decreased balance;Decreased mobility;Decreased coordination;Decreased knowledge of use of DME;Decreased safety awareness;Cardiopulmonary status limiting activity;Decreased skin integrity;Pain       PT Treatment Interventions DME instruction;Gait training;Functional mobility training;Therapeutic activities;Therapeutic exercise;Balance training;Neuromuscular re-education;Patient/family education    PT Goals (Current goals can be found in the Care Plan section)  Acute Rehab PT Goals Patient Stated Goal: to get R leg stronger PT Goal Formulation: With patient/family Time For Goal Achievement: 11/25/19 Potential to Achieve Goals: Good    Frequency Min 2X/week   Barriers to discharge Inaccessible home environment;Decreased caregiver support home alone with ramped entrance    Co-evaluation               AM-PAC PT "6 Clicks" Mobility  Outcome Measure Help needed turning from your back to your side while in a flat bed without using bedrails?: A Little Help needed moving from lying on your back to sitting on the side of a flat bed without using bedrails?: A Lot Help needed moving to and from a bed to a chair (including a wheelchair)?: A Lot Help needed standing up from a chair using your arms (e.g., wheelchair or bedside chair)?: A Lot Help needed to walk in hospital room?: Total Help needed climbing 3-5 steps with a railing? : Total 6 Click Score: 11    End of Session Equipment Utilized During Treatment: Oxygen Activity Tolerance: Patient tolerated treatment well;Patient limited by fatigue;Treatment limited secondary to medical complications (Comment) Patient left: in bed;with call  bell/phone within reach;with bed alarm set;with family/visitor present Nurse Communication: Mobility status;Patient requests pain meds PT Visit Diagnosis: Unsteadiness on feet (R26.81);Muscle weakness (generalized) (M62.81);Pain Pain - Right/Left: Right Pain - part of body: Hip    Time: 3710-6269 PT Time Calculation (min) (ACUTE ONLY): 33 min   Charges:   PT Evaluation $PT Eval Moderate Complexity: 1 Mod PT Treatments $Therapeutic Activity: 8-22 mins       Ramond Dial 11/11/2019, 3:53 PM  Mee Hives, PT MS Acute Rehab Dept. Number: [place]

## 2019-11-11 NOTE — Anesthesia Postprocedure Evaluation (Signed)
Anesthesia Post Note  Patient: Luanne M Piercey  Procedure(s) Performed: INTRAMEDULLARY (IM) NAIL INTERTROCHANTRIC (Right Leg Upper)     Patient location during evaluation: PACU Anesthesia Type: General Level of consciousness: awake and alert, oriented and patient cooperative Pain management: pain level controlled Vital Signs Assessment: post-procedure vital signs reviewed and stable Respiratory status: spontaneous breathing, nonlabored ventilation and respiratory function stable Cardiovascular status: blood pressure returned to baseline and stable Postop Assessment: no apparent nausea or vomiting Anesthetic complications: no    Last Vitals:  Vitals:   11/11/19 1240 11/11/19 1255  BP: (!) 124/106 126/87  Pulse:  95  Resp: 18 17  Temp:  36.7 C  SpO2: 100% 97%    Last Pain:  Vitals:   11/11/19 1255  TempSrc:   PainSc: 4         RLE Motor Response: Purposeful movement;Responds to commands (11/11/19 1255) RLE Sensation: Full sensation (11/11/19 1255)      Lannie Fields

## 2019-11-12 ENCOUNTER — Encounter: Payer: Self-pay | Admitting: *Deleted

## 2019-11-12 ENCOUNTER — Inpatient Hospital Stay (HOSPITAL_COMMUNITY): Payer: Medicare Other

## 2019-11-12 DIAGNOSIS — S72001A Fracture of unspecified part of neck of right femur, initial encounter for closed fracture: Secondary | ICD-10-CM

## 2019-11-12 LAB — BASIC METABOLIC PANEL
Anion gap: 9 (ref 5–15)
BUN: 25 mg/dL — ABNORMAL HIGH (ref 8–23)
CO2: 27 mmol/L (ref 22–32)
Calcium: 8.8 mg/dL — ABNORMAL LOW (ref 8.9–10.3)
Chloride: 98 mmol/L (ref 98–111)
Creatinine, Ser: 1.05 mg/dL — ABNORMAL HIGH (ref 0.44–1.00)
GFR calc Af Amer: 54 mL/min — ABNORMAL LOW (ref >60)
GFR calc non Af Amer: 46 mL/min — ABNORMAL LOW (ref >60)
Glucose, Bld: 275 mg/dL — ABNORMAL HIGH (ref 70–99)
Potassium: 4.1 mmol/L (ref 3.5–5.1)
Sodium: 134 mmol/L — ABNORMAL LOW (ref 135–145)

## 2019-11-12 LAB — CBC
HCT: 29.4 % — ABNORMAL LOW (ref 36.0–46.0)
Hemoglobin: 9.2 g/dL — ABNORMAL LOW (ref 12.0–15.0)
MCH: 28.8 pg (ref 26.0–34.0)
MCHC: 31.3 g/dL (ref 30.0–36.0)
MCV: 91.9 fL (ref 80.0–100.0)
Platelets: 120 10*3/uL — ABNORMAL LOW (ref 150–400)
RBC: 3.2 MIL/uL — ABNORMAL LOW (ref 3.87–5.11)
RDW: 13.6 % (ref 11.5–15.5)
WBC: 12.2 10*3/uL — ABNORMAL HIGH (ref 4.0–10.5)
nRBC: 0 % (ref 0.0–0.2)

## 2019-11-12 LAB — GLUCOSE, CAPILLARY
Glucose-Capillary: 175 mg/dL — ABNORMAL HIGH (ref 70–99)
Glucose-Capillary: 165 mg/dL — ABNORMAL HIGH (ref 70–99)
Glucose-Capillary: 140 mg/dL — ABNORMAL HIGH (ref 70–99)
Glucose-Capillary: 174 mg/dL — ABNORMAL HIGH (ref 70–99)

## 2019-11-12 MED ORDER — RIVAROXABAN 20 MG PO TABS: 20.0000 mg | ORAL_TABLET | Freq: Every day | ORAL | Status: DC

## 2019-11-12 MED ORDER — RIVAROXABAN 15 MG PO TABS
15.0000 mg | ORAL_TABLET | Freq: Every day | ORAL | Status: DC
  Administered 2019-11-12 – 2019-11-13 (×2): 15 mg via ORAL
  Filled 2019-11-12 (×2): qty 1

## 2019-11-12 NOTE — TOC Initial Note (Signed)
Transition of Care [facility]) - Initial/Assessment Note    Patient Details  Name: Jasmine Jenkins MRN: [id number] Date of Birth: 1926-05-15  Transition of Care [facility]) CM/SW Contact:    Jasmine Lesches, RN Phone Number: [phone] 11/12/2019, 8:51 PM  Clinical Narrative:   Admitted s/p fall, suffered R hip fx. From home alone. Son Jasmine Maduro lives next door. PTA independent with ADL's, no DME usage.Liset Jenkins (Son)     [phone]            -s/p  Cephalomedullary fixation of right intertrochanteric femur fracture, 4/13  NCM spoke with son/pt regarding TOC needs. Shared PT's recommendation of SNF /rehab @ d/c. Pt agreeable to SNF placement .  Pt is currently unable to care for self @ home given patient's current physical needs and fall risk. Patient reports preference for [facility]. NCM discussed insurance authorization process No further questions reported at this time. RNCM to continue to follow and assist with discharge planning needs.   Expected Discharge Plan: Skilled Nursing Facility Barriers to Discharge: Continued Medical Work up   Patient Goals and CMS Choice        Expected Discharge Plan and Services Expected Discharge Plan: Skilled Nursing Facility                                              Prior Living Arrangements/Services                       Activities of Daily Living Home Assistive Devices/Equipment: Eyeglasses, Gilmer Mor (specify quad or straight) ADL Screening (condition at time of admission) Patient's cognitive ability adequate to safely complete daily activities?: Yes Is the patient deaf or have difficulty hearing?: Yes Does the patient have difficulty seeing, even when wearing glasses/contacts?: Yes(has bilateral cataracts.) Does the patient have difficulty concentrating, remembering, or making decisions?: No Patient able to express need for assistance with ADLs?: Yes Does the patient have difficulty dressing  or bathing?: No Independently performs ADLs?: No Communication: Independent Dressing (OT): Needs assistance Is this a change from baseline?: Change from baseline, expected to last >3 days Grooming: Independent Feeding: Independent Bathing: Needs assistance Is this a change from baseline?: Change from baseline, expected to last >3 days Toileting: Needs assistance Is this a change from baseline?: Change from baseline, expected to last >3days In/Out Bed: Needs assistance Is this a change from baseline?: Change from baseline, expected to last >3 days Walks in Home: Needs assistance Is this a change from baseline?: Change from baseline, expected to last >3 days Does the patient have difficulty walking or climbing stairs?: Yes Weakness of Legs: Right Weakness of Arms/Hands: None  Permission Sought/Granted                  Emotional Assessment              Admission diagnosis:  Hip fracture (HCC) [S72.009A] Leucocytosis [D72.829] Closed right hip fracture (HCC) [S72.001A] Patient Active Problem List   Diagnosis Date Noted  . Closed intertrochanteric fracture of right hip (HCC) 11/10/2019  . Closed right hip fracture (HCC) 11/10/2019  . Hip fracture (HCC) 11/10/2019  . Dysphagia 08/30/2017  . Stricture and stenosis of esophagus 08/30/2017  . CAD, NATIVE VESSEL 01/04/2010  . CHEST PAIN-PRECORDIAL 05/27/2009  . HYPERTENSION, BENIGN 05/26/2009  . Atrial fibrillation (HCC)  05/26/2009  . DYSPNEA 05/26/2009  . Atherosclerosis of renal artery ([place]) 05/21/2009   PCP:  Jasmine Orn, MD Pharmacy:   [facility] [place] - [address] [place] ([place])  [address] Phone: [phone] Fax: [phone]     Social Determinants of Health (SDOH) Interventions    Readmission Risk Interventions No flowsheet data found.

## 2019-11-12 NOTE — NC FL2 (Signed)
[place] MEDICAID FL2 LEVEL OF CARE SCREENING TOOL     IDENTIFICATION  Patient Name: Jasmine Jenkins Birthdate: 05-24-1926 Sex: female Admission Date (Current Location): 11/10/2019  [facility] and [place] Number:  Herbalist and Address:  [facility] [place], [place] [address], [place], [place] [address]      Provider Number: [id number]  Attending Physician Name and Address:  [place] Donnamarie Poag, DO  Relative Name and Phone Number:       Current Level of Care: Hospital Recommended Level of Care: [place] Prior Approval Number:    Date Approved/Denied:   PASRR Number: [id number]  Discharge Plan: SNF    Current Diagnoses: Patient Active Problem List   Diagnosis Date Noted  . Closed intertrochanteric fracture of right hip ([place]) 11/10/2019  . Closed right hip fracture ([place]) 11/10/2019  . Hip fracture ([place]) 11/10/2019  . Dysphagia 08/30/2017  . Stricture and stenosis of esophagus 08/30/2017  . CAD, NATIVE VESSEL 01/04/2010  . CHEST PAIN-PRECORDIAL 05/27/2009  . HYPERTENSION, BENIGN 05/26/2009  . Atrial fibrillation ([place]) 05/26/2009  . DYSPNEA 05/26/2009  . Atherosclerosis of renal artery ([place]) 05/21/2009    Orientation RESPIRATION BLADDER Height & Weight     Time, Situation, Place, Self  Normal Continent Weight: 57.8 kg Height:  5\' 5"  (165.1 cm)  BEHAVIORAL SYMPTOMS/MOOD NEUROLOGICAL BOWEL NUTRITION STATUS      Continent Diet(refer to d/c summary)  AMBULATORY STATUS COMMUNICATION OF NEEDS Skin   Extensive Assist Verbally Other (Comment)(R femur repair,4/13, surgical incision)                       Personal Care Assistance Level of Assistance  Bathing, Feeding, Dressing Bathing Assistance: Maximum assistance Feeding assistance: Independent Dressing Assistance: Maximum assistance     Functional Limitations Info  Sight, Hearing, Speech Sight Info: Adequate Hearing Info: Adequate Speech Info: Adequate    SPECIAL CARE  FACTORS FREQUENCY  PT (By licensed PT), OT (By licensed OT)     PT Frequency: 5x/ week, evaluate and treat OT Frequency: 5x/ week, evaluate and treat            Contractures Contractures Info: Not present    Additional Factors Info  Code Status, Allergies Code Status Info: Full code Allergies Info: Penicillins, Codeine, Hydrocodone-acetaminophen           Current Medications (11/12/2019):  This is the current hospital active medication list Current Facility-Administered Medications  Medication Dose Route Frequency Provider Last Rate Last Admin  . docusate sodium (COLACE) capsule 100 mg  100 mg Oral BID Erle Crocker, MD   100 mg at 11/12/19 0959  . insulin aspart (novoLOG) injection 0-5 Units  0-5 Units Subcutaneous QHS Erle Crocker, MD   2 Units at 11/11/19 2134  . insulin aspart (novoLOG) injection 0-9 Units  0-9 Units Subcutaneous TID WC Erle Crocker, MD   1 Units at 11/12/19 1646  . losartan (COZAAR) tablet 50 mg  50 mg Oral Daily Erle Crocker, MD   50 mg at 11/12/19 1000  . metoCLOPramide (REGLAN) tablet 5-10 mg  5-10 mg Oral Q8H PRN Erle Crocker, MD       Or  . metoCLOPramide (REGLAN) injection 5-10 mg  5-10 mg Intravenous Q8H PRN Erle Crocker, MD      . metoprolol succinate (TOPROL-XL) 24 hr tablet 50 mg  50 mg Oral Daily Erle Crocker, MD   50 mg at 11/12/19 1000  .  morphine 2 MG/ML injection 2 mg  2 mg Intravenous Q4H PRN Terance Hart, MD   2 mg at 11/10/19 2041  . ondansetron (ZOFRAN) injection 4 mg  4 mg Intravenous Q6H PRN Terance Hart, MD   4 mg at 11/11/19 0840  . ondansetron (ZOFRAN) tablet 4 mg  4 mg Oral Q6H PRN Terance Hart, MD   4 mg at 11/12/19 1553   Or  . ondansetron (ZOFRAN) injection 4 mg  4 mg Intravenous Q6H PRN Terance Hart, MD      . oxyCODONE (Oxy IR/ROXICODONE) immediate release tablet 5 mg  5 mg Oral Q6H PRN Terance Hart, MD   5 mg at 11/12/19 1836  .  polyethylene glycol (MIRALAX / GLYCOLAX) packet 17 g  17 g Oral Daily Terance Hart, MD      . Rivaroxaban Carlena Hurl) tablet 15 mg  15 mg Oral Q supper [place], Eric J, DO   15 mg at 11/12/19 1648  . senna (SENOKOT) tablet 8.6 mg  1 tablet Oral BID Terance Hart, MD   8.6 mg at 11/12/19 1001  . vitamin B-12 (CYANOCOBALAMIN) tablet 1,000 mcg  1,000 mcg Oral Daily Terance Hart, MD   1,000 mcg at 11/12/19 7026     Discharge Medications: Please see discharge summary for a list of discharge medications.  Relevant Imaging Results:  Relevant Lab Results:   Additional Information SS#  [id number]  Epifanio Lesches, RN

## 2019-11-12 NOTE — Progress Notes (Signed)
PROGRESS NOTE    Jasmine Jenkins  KGM:[id number] DOB: Jul 27, 1926 DOA: 11/10/2019 PCP: Kirby Funk, MD    Brief Narrative:  Jasmine Jenkins is a 86 y.o. female with medical history significant ofpermanent A. fib on Xarelto, hypertension, diabetes type 2 who presents to the emergency department for the evaluation of right hip pain. Patient is pretty independent and lives by herself. She was working in her yard and she was preparing to [employer] her lawn whenshe had a mechanical fall today, followed by immediate pain and decreased mobility of her right hip. She did not strike her head on the ground. She did not lose any consciousness. She was unable to ambulate after she fell. EMS arrived and she was given 100 mcg of fentanyl en route.  She follows with Dr. Johney Frame for her A. fib. She is on Xarelto for anticoagulation and takes metoprolol for rate control. Her son lives next door.   Assessment & Plan:   Principal Problem:   Closed intertrochanteric fracture of right hip (HCC) Active Problems:   HYPERTENSION, BENIGN   Atrial fibrillation (HCC)   Closed right hip fracture (HCC)   Hip fracture (HCC)   Right hip fracture Patient presenting to ED following mechanical fall at home.  X-ray notable for right intratrochanteric fracture with slight varus angulation.  Patient underwent ORIF with cephalomedullary nail by Dr. Susa Simmonds on 11/11/2019. --Will restart home Xarelto today, okay per orthopedics --Awaiting PT/OT evaluation, patient hopes to be able to discharge back home --2-week follow-up orthopedics for x-rays right hip and wound check --Oxycodone grams every 6 hours prn for moderate/severe pain, will try to avoid given her advanced age  Essential hypertension --Continue losartan 50 mg p.o. daily, metoprolol succinate 50 g p.o. daily  Permanent atrial fibrillation Continue metoprolol succinate 50 mg p.o. daily for rate control. --Restart home Xarelto today  Type 2 diabetes  mellitus On glimepiride 2 mg p.o. daily at home.  Leukocytosis Unclear etiology, but high suspicion for reactive given fracture as above.  History of dysphagia Follows gastroenterology outpatient, Dr. Bosie Clos.  Has undergone esophageal dilations in the past. Usually tolerates a regular diet in the a.m. but by the evening has more trouble and usually takes and only liquids such as Ensure  DVT prophylaxis: Xarelto Code Status: Full Code Family Communication: Daughter present at bedside Disposition Plan:       Status is: Inpatient  Remains inpatient appropriate because:Ongoing active pain requiring inpatient pain management, Unsafe d/c plan and Inpatient level of care appropriate due to severity of illness   Dispo: The patient is from: Home              Anticipated d/c is to: Home vs SNF; pending PT/OT eval              Anticipated d/c date is: 1 day              Patient currently is not medically stable to d/c.    Consultants:   Orthopedics, Dr. Susa Simmonds  Procedures:  Cephalomedullary fixation of right intertrochanteric femur fracture on 11/11/2019  Antimicrobials:   Perioperative clindamycin   Subjective: Patient seen and examined bedside, resting comfortably.  States pain is fairly well controlled.  Wishes to discharge home when she is ready.  Awaiting PT/OT evaluation.  Seen by speech therapy this morning, recommend esophagram due to concerns for esophageal dysphagia in which she is required dilations in the past.  No other complaints or concerns at this time.  Denies headache, no  visual changes, no chest pain, no palpitations, no shortness of breath, no abdominal pain.  No acute events overnight per nursing staff.  Objective: Vitals:   11/12/19 0427 11/12/19 0807 11/12/19 1008 11/12/19 1012  BP: 111/62 106/60  (!) 94/49  Pulse: 72 69 (!) 101 86  Resp: 19 16  17   Temp: 98 F (36.7 C) 97.8 F (36.6 C)  98.5 F (36.9 C)  TempSrc: Oral Oral  Oral  SpO2: 100% 100% 91%  91%  Weight:      Height:        Intake/Output Summary (Last 24 hours) at 11/12/2019 1317 Last data filed at 11/12/2019 0900 Gross per 24 hour  Intake 930 ml  Output --  Net 930 ml   Filed Weights   11/10/19 1435 11/10/19 2200  Weight: 57.2 kg 57.8 kg    Examination:  General exam: Appears calm and comfortable  Respiratory system: Clear to auscultation. Respiratory effort normal.  Oxygenating well on room air. Cardiovascular system: S1 & S2 heard, RRR. No JVD, murmurs, rubs, gallops or clicks. No pedal edema. Gastrointestinal system: Abdomen is nondistended, soft and nontender. No organomegaly or masses felt. Normal bowel sounds heard. Central nervous system: Alert and oriented. No focal neurological deficits. Extremities: Symmetric 5 x 5 power. Skin: No rashes, lesions or ulcers, surgical dressing noted in place, clean/dry/intact Psychiatry: Judgement and insight appear normal. Mood & affect appropriate.     Data Reviewed: I have personally reviewed following labs and imaging studies  CBC: Recent Labs  Lab 11/10/19 1536 11/12/19 0227  WBC 18.3* 12.2*  NEUTROABS 16.4*  --   HGB 12.6 9.2*  HCT 41.2 29.4*  MCV 93.0 91.9  PLT 169 120*   Basic Metabolic Panel: Recent Labs  Lab 11/10/19 1536 11/12/19 0227  NA 133* 134*  K 4.9 4.1  CL 101 98  CO2 23 27  GLUCOSE 259* 275*  BUN 19 25*  CREATININE 0.63 1.05*  CALCIUM 8.9 8.8*   GFR: Estimated Creatinine Clearance: 31.4 mL/min (A) (by C-G formula based on SCr of 1.05 mg/dL (H)). Liver Function Tests: No results for input(s): AST, ALT, ALKPHOS, BILITOT, PROT, ALBUMIN in the last 168 hours. No results for input(s): LIPASE, AMYLASE in the last 168 hours. No results for input(s): AMMONIA in the last 168 hours. Coagulation Profile: Recent Labs  Lab 11/10/19 1536  INR 1.2   Cardiac Enzymes: No results for input(s): CKTOTAL, CKMB, CKMBINDEX, TROPONINI in the last 168 hours. BNP (last 3 results) No results for  input(s): PROBNP in the last 8760 hours. HbA1C: No results for input(s): HGBA1C in the last 72 hours. CBG: Recent Labs  Lab 11/11/19 1332 11/11/19 1649 11/11/19 2052 11/12/19 0840 11/12/19 1204  GLUCAP 93 209* 223* 175* 165*   Lipid Profile: No results for input(s): CHOL, HDL, LDLCALC, TRIG, CHOLHDL, LDLDIRECT in the last 72 hours. Thyroid Function Tests: No results for input(s): TSH, T4TOTAL, FREET4, T3FREE, THYROIDAB in the last 72 hours. Anemia Panel: No results for input(s): VITAMINB12, FOLATE, FERRITIN, TIBC, IRON, RETICCTPCT in the last 72 hours. Sepsis Labs: No results for input(s): PROCALCITON, LATICACIDVEN in the last 168 hours.  Recent Results (from the past 240 hour(s))  Respiratory Panel by RT PCR (Flu A&B, Covid) - Nasopharyngeal Swab     Status: None   Collection Time: 11/10/19  3:47 PM   Specimen: Nasopharyngeal Swab  Result Value Ref Range Status   SARS Coronavirus 2 by RT PCR NEGATIVE NEGATIVE Final    Comment: (NOTE) SARS-CoV-2 target  nucleic acids are NOT DETECTED. The SARS-CoV-2 RNA is generally detectable in upper respiratoy specimens during the acute phase of infection. The lowest concentration of SARS-CoV-2 viral copies this assay can detect is 131 copies/mL. A negative result does not preclude SARS-Cov-2 infection and should not be used as the sole basis for treatment or other patient management decisions. A negative result may occur with  improper specimen collection/handling, submission of specimen other than nasopharyngeal swab, presence of viral mutation(s) within the areas targeted by this assay, and inadequate number of viral copies (<131 copies/mL). A negative result must be combined with clinical observations, patient history, and epidemiological information. The expected result is Negative. Fact Sheet for Patients:  https://www.moore.com/ Fact Sheet for Healthcare Providers:   https://www.young.biz/ This test is not yet ap proved or cleared by the [place] FDA and  has been authorized for detection and/or diagnosis of SARS-CoV-2 by FDA under an Emergency Use Authorization (EUA). This EUA will remain  in effect (meaning this test can be used) for the duration of the COVID-19 declaration under Section 564(b)(1) of the Act, 21 U.S.C. section 360bbb-3(b)(1), unless the authorization is terminated or revoked sooner.    Influenza A by PCR NEGATIVE NEGATIVE Final   Influenza B by PCR NEGATIVE NEGATIVE Final    Comment: (NOTE) The Xpert Xpress SARS-CoV-2/FLU/RSV assay is intended as an aid in  the diagnosis of influenza from Nasopharyngeal swab specimens and  should not be used as a sole basis for treatment. Nasal washings and  aspirates are unacceptable for Xpert Xpress SARS-CoV-2/FLU/RSV  testing. Fact Sheet for Patients: https://www.moore.com/ Fact Sheet for Healthcare Providers: https://www.young.biz/ This test is not yet approved or cleared by the [place] FDA and  has been authorized for detection and/or diagnosis of SARS-CoV-2 by  FDA under an Emergency Use Authorization (EUA). This EUA will remain  in effect (meaning this test can be used) for the duration of the  Covid-19 declaration under Section 564(b)(1) of the Act, 21  U.S.C. section 360bbb-3(b)(1), unless the authorization is  terminated or revoked. Performed at [facility], [address], [place], [place] [address]   Surgical pcr screen     Status: None   Collection Time: 11/10/19  9:27 PM   Specimen: Nasal Mucosa; Nasal Swab  Result Value Ref Range Status   MRSA, PCR NEGATIVE NEGATIVE Final   Staphylococcus aureus NEGATIVE NEGATIVE Final    Comment: (NOTE) The Xpert SA Assay (FDA approved for NASAL specimens in patients 50 years of age and older), is one component of a comprehensive surveillance  program. It is not intended to diagnose infection nor to guide or monitor treatment. Performed at [facility], [address] [place], [place] [address]          Radiology Studies: DG CHEST PORT 1 VIEW  Result Date: 11/10/2019 CLINICAL DATA:  Known right femur fracture, surgery planned for tomorrow EXAM: PORTABLE CHEST 1 VIEW COMPARISON:  CT 05/08/2009, esophagram 08/22/2017 FINDINGS: Diffuse mixed interstitial and airspace opacities throughout both lungs with a perihilar and peripheral predominance. Indistinct pulmonary vascularity. No visible pneumothorax or effusion. Biapical pleuroparenchymal scarring is noted. Slight prominence of the cardiomediastinal silhouette and a moderate hiatal hernia which was seen on comparison study. No acute osseous or soft tissue abnormality. Degenerative changes are present in the imaged spine and shoulders. Telemetry leads overlie the chest. IMPRESSION: Mixed interstitial airspace opacities throughout both lungs with indistinct vascularity and a prominent cardiomediastinal silhouette favor CHF/volume overload likely with some  atelectasis though underlying infection is not excluded. Electronically Signed   By: Lovena Le M.D.   On: 11/10/2019 17:29   DG C-Arm 1-60 Min  Result Date: 11/11/2019 CLINICAL DATA:  Proximal right femur fracture EXAM: RIGHT FEMUR 2 VIEWS; DG C-ARM 1-60 MIN COMPARISON:  11/10/2019 FLUOROSCOPY TIME:  Radiation Exposure Index (as provided by the fluoroscopic device): Not available If the device does not provide the exposure index: Fluoroscopy Time:  47 seconds Number of Acquired Images:  4 FINDINGS: Medullary rod and proximal fixation screw is noted. A mid femoral fixation screws seen as well. Fracture fragments are in near anatomic alignment. IMPRESSION: ORIF of proximal right femoral fracture. Electronically Signed   By: Inez Catalina M.D.   On: 11/11/2019 15:12   ECHOCARDIOGRAM COMPLETE  Result Date: 11/11/2019     ECHOCARDIOGRAM REPORT   Patient Name:   Jasmine Jenkins Date of Exam: 11/11/2019 Medical Rec #:  [id number]       Height:       65.0 in Accession #:    [id number]      Weight:       127.4 lb Date of Birth:  10-26-1925       BSA:          1.633 m Patient Age:    70 years        BP:           114/68 mmHg Patient Gender: F               HR:           105 bpm. Exam Location:  Inpatient Procedure: 2D Echo Indications:    CHF-Acute diastolic 144.31  History:        Patient has prior history of Echocardiogram examinations, most                 recent 10/23/2011. Arrythmias:Atrial Fibrillation; Risk                 Factors:Hypertension and Diabetes.  Sonographer:    Mikki Santee RDCS (AE) Referring Phys: [id number] AMRIT ADHIKARI IMPRESSIONS  1. Left ventricular ejection fraction, by estimation, is 60 to 65%. The left ventricle has normal function. The left ventricle has no regional wall motion abnormalities. There is mild concentric left ventricular hypertrophy. Left ventricular diastolic function could not be evaluated.  2. Right ventricular systolic function is mildly reduced. The right ventricular size is mildly enlarged. Mildly increased right ventricular wall thickness. There is moderately elevated pulmonary artery systolic pressure. The estimated right ventricular systolic pressure is 61.9 mmHg.  3. Left atrial size was severely dilated.  4. Right atrial size was severely dilated.  5. The mitral valve is normal in structure. No evidence of mitral valve regurgitation.  6. Tricuspid valve regurgitation is moderate to severe.  7. The aortic valve is tricuspid. Aortic valve regurgitation is not visualized. Mild aortic valve stenosis.  8. The inferior vena cava is dilated in size with >50% respiratory variability, suggesting right atrial pressure of 8 mmHg. Comparison(s): Prior images unable to be directly viewed, comparison made by report only. Pulmonary hypertension is worse , otherwise little change. FINDINGS  Left  Ventricle: Left ventricular ejection fraction, by estimation, is 60 to 65%. The left ventricle has normal function. The left ventricle has no regional wall motion abnormalities. The left ventricular internal cavity size was normal in size. There is  mild concentric left ventricular hypertrophy. Left ventricular diastolic function could not be evaluated due to  atrial fibrillation. Left ventricular diastolic function could not be evaluated. Right Ventricle: The right ventricular size is mildly enlarged. Mildly increased right ventricular wall thickness. Right ventricular systolic function is mildly reduced. There is moderately elevated pulmonary artery systolic pressure. The tricuspid regurgitant velocity is 3.54 m/s, and with an assumed right atrial pressure of 8 mmHg, the estimated right ventricular systolic pressure is 58.1 mmHg. Left Atrium: Left atrial size was severely dilated. Right Atrium: Right atrial size was severely dilated. Pericardium: There is no evidence of pericardial effusion. Mitral Valve: The mitral valve is normal in structure. No evidence of mitral valve regurgitation. Tricuspid Valve: The tricuspid valve is normal in structure. Tricuspid valve regurgitation is moderate to severe. Aortic Valve: The aortic valve is tricuspid. . There is moderate thickening and moderate calcification of the aortic valve. Aortic valve regurgitation is not visualized. Mild aortic stenosis is present. There is moderate thickening of the aortic valve. There is moderate calcification of the aortic valve. Aortic valve mean gradient measures 8.7 mmHg. Aortic valve peak gradient measures 16.5 mmHg. Aortic valve area, by VTI measures 1.25 cm. Pulmonic Valve: The pulmonic valve was grossly normal. Pulmonic valve regurgitation is trivial. Aorta: The aortic root and ascending aorta are structurally normal, with no evidence of dilitation. Venous: The inferior vena cava is dilated in size with greater than 50% respiratory  variability, suggesting right atrial pressure of 8 mmHg. IAS/Shunts: No atrial level shunt detected by color flow Doppler.  LEFT VENTRICLE PLAX 2D LVIDd:         2.90 cm LVIDs:         2.30 cm LV PW:         1.20 cm LV IVS:        1.30 cm LVOT diam:     2.00 cm LV SV:         45 LV SV Index:   28 LVOT Area:     3.14 cm  LEFT ATRIUM              Index       RIGHT ATRIUM           Index LA diam:        3.80 cm  2.33 cm/m  RA Area:     19.40 cm LA Vol (A2C):   119.0 ml 72.86 ml/m RA Volume:   51.60 ml  31.59 ml/m LA Vol (A4C):   70.4 ml  43.10 ml/m LA Biplane Vol: 92.3 ml  56.51 ml/m  AORTIC VALVE AV Area (Vmax):    1.25 cm AV Area (Vmean):   1.17 cm AV Area (VTI):     1.25 cm AV Vmax:           203.00 cm/s AV Vmean:          138.333 cm/s AV VTI:            0.360 m AV Peak Grad:      16.5 mmHg AV Mean Grad:      8.7 mmHg LVOT Vmax:         80.83 cm/s LVOT Vmean:        51.333 cm/s LVOT VTI:          0.143 m LVOT/AV VTI ratio: 0.40  AORTA Ao Root diam: 3.00 cm TRICUSPID VALVE TR Peak grad:   50.1 mmHg TR Vmax:        354.00 cm/s  SHUNTS Systemic VTI:  0.14 m Systemic Diam: 2.00 cm Thurmon Fair MD Electronically signed  by Thurmon Fair MD Signature Date/Time: 11/11/2019/4:21:33 PM    Final    DG Hip Crossnore W or [place] Pelvis 1 View Right  Result Date: 11/10/2019 CLINICAL DATA:  Fall, right hip EXAM: DG HIP (WITH OR WITHOUT PELVIS) 1V PORT RIGHT COMPARISON:  None. FINDINGS: There is a right femoral intertrochanteric fracture. Slight varus angulation. No subluxation or dislocation. IMPRESSION: Right intertrochanteric fracture with slight varus angulation. Electronically Signed   By: Charlett Nose M.D.   On: 11/10/2019 15:25   DG FEMUR, MIN 2 VIEWS RIGHT  Result Date: 11/11/2019 CLINICAL DATA:  Proximal right femur fracture EXAM: RIGHT FEMUR 2 VIEWS; DG C-ARM 1-60 MIN COMPARISON:  11/10/2019 FLUOROSCOPY TIME:  Radiation Exposure Index (as provided by the fluoroscopic device): Not available If the device  does not provide the exposure index: Fluoroscopy Time:  47 seconds Number of Acquired Images:  4 FINDINGS: Medullary rod and proximal fixation screw is noted. A mid femoral fixation screws seen as well. Fracture fragments are in near anatomic alignment. IMPRESSION: ORIF of proximal right femoral fracture. Electronically Signed   By: Alcide Clever M.D.   On: 11/11/2019 15:12   DG FEMUR, MIN 2 VIEWS RIGHT  Result Date: 11/10/2019 CLINICAL DATA:  Known right hip fracture, surgery planned for tomorrow EXAM: RIGHT FEMUR 2 VIEWS COMPARISON:  Radiograph 11/10/2019 FINDINGS: There is redemonstration of the varus angulated right intertrochanteric femur fracture. The femoral head remains normally located. The distal femur is intact. Alignment at the knee is grossly preserved albeit with moderate tricompartmental degenerative changes and meniscal chondrocalcinosis of both medial and lateral compartments as well as arthrosis with the corticated fabella posteriorly. Vascular calcium noted at the knee as well. IMPRESSION: 1. Varus angulated right intertrochanteric femur fracture. No distal femoral fractures. 2. 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 Electronically Signed   By: Kreg Shropshire M.D.   On: 11/10/2019 17:27        Scheduled Meds: . docusate sodium  100 mg Oral BID  . insulin aspart  0-5 Units Subcutaneous QHS  . insulin aspart  0-9 Units Subcutaneous TID WC  . losartan  50 mg Oral Daily  . metoprolol succinate  50 mg Oral Daily  . polyethylene glycol  17 g Oral Daily  . senna  1 tablet Oral BID  . vitamin B-12  1,000 mcg Oral Daily   Continuous Infusions:   LOS: 2 days    Time spent: 36 minutes spent on chart review, discussion with nursing staff, consultants, updating family and interview/physical exam; more than 50% of that time was spent in counseling and/or coordination of care.    Alvira Philips [place], DO [facility] Available via Epic secure chat  7am-7pm After these hours, please refer to coverage provider listed on [email or web address] 11/12/2019, 1:17 PM

## 2019-11-12 NOTE — Progress Notes (Signed)
     Jasmine Jenkins is a 86 y.o. female   Orthopaedic diagnosis: Postop day 1 status post cephalomedullary nail of right intertrochanteric hip fracture  Subjective: She is doing well.  She states her hip feels more stable.  Pain is minimal.  She has been up to the bedside commode.  Denies fevers or chills.  Objectyive: Vitals:   11/12/19 1008 11/12/19 1012  BP:  (!) 94/49  Pulse: (!) 101 86  Resp:  17  Temp:  98.5 F (36.9 C)  SpO2: 91% 91%     Exam: Awake and alert Respirations even and unlabored No acute distress  Right hip dressing intact.  No drainage.  Minimal discomfort with passive hip range of motion.  No tenderness palpation distal about the thigh or knee.  Assessment: Postop day 1 status post cephalomedullary nail of right hip fracture.  Doing well.   Plan: At this point she is weightbearing as tolerated.  She will mobilize with physical therapy for disposition planning.  Sounds like she is hoping to go home.  She is suitable for discharge from an orthopedic standpoint once cleared by hospitalist and physical therapy.  She will follow-up with me in 2 weeks for x-rays of the right hip and wound check.  She will call my office with concerns.  She may restart her anticoagulant today.  Continue that for DVT prophylaxis at home.  I would avoid narcotic pain medicine in this geriatric patient.   Nicki [place], MD

## 2019-11-12 NOTE — Evaluation (Signed)
Clinical/Bedside Swallow Evaluation Patient Details  Name: Jasmine Jenkins MRN: [id number] Date of Birth: [birth year]  Today's Date: 11/12/2019 Time: SLP Start Time (ACUTE ONLY): 0915 SLP Stop Time (ACUTE ONLY): 0945 SLP Time Calculation (min) (ACUTE ONLY): 30 min  Past Medical History:  Past Medical History:  Diagnosis Date  . Arthritis   . Chronic back pain   . Chronic kidney disease   . Diabetes (HCC)   . Diverticulosis   . GERD (gastroesophageal reflux disease)   . H/O hiatal hernia   . Hypertension   . Lower extremity edema   . Mild pulmonary hypertension (HCC)   . Peripheral vascular disease (HCC)   . Persistent atrial fibrillation (HCC)   . PONV (postoperative nausea and vomiting)    when she is completely put under  . Renal artery stenosis (HCC)   . Streptococcal sepsis [facility])    Past Surgical History:  Past Surgical History:  Procedure Laterality Date  . BACK SURGERY    . BALLOON DILATION N/A 09/27/2017   Procedure: BALLOON DILATION;  Surgeon: Charlott Rakes, MD;  Location: [facility] ENDOSCOPY;  Service: Endoscopy;  Laterality: N/A;  . CARDIAC CATHETERIZATION    . CARDIOVERSION  11/29/2011   Procedure: CARDIOVERSION;  Surgeon: Corky Crafts, MD;  Location: [facility] OR;  Service: Cardiovascular;  Laterality: N/A;  . CARDIOVERSION N/A 10/02/2012   Procedure: CARDIOVERSION;  Surgeon: Corky Crafts, MD;  Location: [facility] ENDOSCOPY;  Service: Cardiovascular;  Laterality: N/A;  . ESOPHAGOGASTRODUODENOSCOPY (EGD) WITH PROPOFOL N/A 08/24/2016   Procedure: ESOPHAGOGASTRODUODENOSCOPY (EGD) WITH PROPOFOL;  Surgeon: Dorena Cookey, MD;  Location: [facility] ENDOSCOPY;  Service: Endoscopy;  Laterality: N/A;  . ESOPHAGOGASTRODUODENOSCOPY (EGD) WITH PROPOFOL N/A 09/27/2017   Procedure: ESOPHAGOGASTRODUODENOSCOPY (EGD) WITH PROPOFOL;  Surgeon: Charlott Rakes, MD;  Location: [facility] ENDOSCOPY;  Service: Endoscopy;  Laterality: N/A;  . HERNIA REPAIR    . SAVORY DILATION N/A 08/24/2016   Procedure: SAVORY  DILATION;  Surgeon: Dorena Cookey, MD;  Location: [facility] ENDOSCOPY;  Service: Endoscopy;  Laterality: N/A;  . SAVORY DILATION N/A 09/27/2017   Procedure: SAVORY DILATION;  Surgeon: Charlott Rakes, MD;  Location: [facility] ENDOSCOPY;  Service: Endoscopy;  Laterality: N/A;   HPI:  86yo female admitted 11/10/19 with fall, right hip pain. Lives alone. PMH: AFib, HTN, GERD, CKD, PVD, DM.   Assessment / Plan / Recommendation Clinical Impression  Pt was seen at bedside for clinical swallow evaluation. Pt was seated upright in bed eating breakfast upon arrival of SLP. Daughter present. CN exam is unremarkable. Pt has adequate dentition. No obvious oral issues or overt s/s aspiration observed on any consistency observed. Pt has had multiple esophageal studies and dilations, with the most recent in 2019.   Pt/daughter were given written behavioral and dietary strategies for managment of esophageal dysmotility, and this information was reviewed thoroughly with them. Recommend consideration of repeat regular barium swallow to re-evaluate esophageal motility and determine if further intervention is warranted. RN and MD informed of recommendation.   No ST follow up recommended at this time. Please reconsult if needs arise.    SLP Visit Diagnosis: Dysphagia, unspecified (R13.10)    Aspiration Risk  Mild aspiration risk    Diet Recommendation Regular;Thin liquid   Liquid Administration via: Cup;Straw Medication Administration: Whole meds with liquid Supervision: Patient able to self feed Compensations: Minimize environmental distractions;Follow solids with liquid;Slow rate;Small sips/bites;Other (Comment)(provided strategies) Postural Changes: Seated upright at 90 degrees;Remain upright for at least 30 minutes after po intake    Other  Recommendations Recommended Consults:  Consider esophageal assessment Oral Care Recommendations: Oral care BID     Follow up Recommendations None          Prognosis Prognosis  for Safe Diet Advancement: Good      Swallow Study   General Date of Onset: 11/10/19 HPI: 86yo female admitted 11/10/19 with fall, right hip pain. Lives alone. PMH: AFib, HTN, GERD, CKD, PVD, DM. Type of Study: Bedside Swallow Evaluation Previous Swallow Assessment: MBS 07/10/2019 - OP WFL, primary esophageal dysphagia. BaSw 1/19 = tight distal esophageal stricture. tertiary contractions, hiatal hernia, silent pen to vf. Barium tablet lodged at stricture. Dilation 09/27/17 Diet Prior to this Study: Regular Temperature Spikes Noted: No Respiratory Status: Nasal cannula Behavior/Cognition: Alert;Cooperative;Pleasant mood Oral Cavity Assessment: Within Functional Limits Oral Care Completed by SLP: No Oral Cavity - Dentition: Adequate natural dentition Vision: Functional for self-feeding Self-Feeding Abilities: Able to feed self Patient Positioning: Upright in bed Baseline Vocal Quality: Normal Volitional Cough: Strong Volitional Swallow: Able to elicit    Oral/Motor/Sensory Function Overall Oral Motor/Sensory Function: Within functional limits   Ice Chips Ice chips: Not tested   Thin Liquid Thin Liquid: Within functional limits Presentation: Cup;Straw    Nectar Thick Nectar Thick Liquid: Not tested   Honey Thick Honey Thick Liquid: Not tested   Puree Puree: Within functional limits Presentation: Self Fed   Solid     Solid: Within functional limits Presentation: Abanda B. Quentin Ore, [facility], [place] Speech Language Pathologist Office: [phone]  Shonna Chock 11/12/2019,10:24 AM

## 2019-11-12 NOTE — Discharge Instructions (Addendum)
Dysphagia Eating Plan, Pureed This diet is helpful for people with moderate to severe swallowing problems. Pureed foods are smooth and are prepared without lumps so that they can be swallowed safely. Work with your health care provider and your diet and nutrition specialist (dietitian) to make sure that you are following the diet safely and getting all the nutrients you need. What are tips for following this plan? General instructions  You may eat foods that are soft and have a pudding-like texture.  Do not eat foods that you have to chew. If you have to chew the food, then you cannot eat it.  Avoid foods that are hard, dry, sticky, chunky, lumpy, or stringy. Also avoid foods with nuts, seeds, raisins, skins, or pulp.  You may be instructed to thicken liquids. Follow your health care provider's instructions about how to do this and to what consistency. Cooking   If a food is not originally a smooth texture, you may be able to eat the food after: ? Pureeing it. This can be done with a blender. ? Moistening it. This can be done by adding juice, cooking liquid, gravy, or sauce to a dry food and then pureeing it. For example, you may have bread if you soak it in milk and puree it.  If a food is too thin, you may add a commercial thickener, corn starch, rice cereal, or potato flakes to thicken it.  Strain and throw away any liquid that separates from a solid pureed food before eating.  Strain lumps, chunks, pulp, and seeds from pureed foods before eating.  Reheat foods slowly to prevent a tough crust from forming. Meal planning  Eat a variety of foods to get all the nutrients you need.  Add dry milk or protein powder to food to increase calories and protein content.  Follow your meal plan as told by your dietitian. What foods are allowed? The items listed may not be a complete list. Talk with your dietitian about what dietary choices are best for you. Grains Soft breads, pancakes,  [place] toast, muffins, and bread stuffing pureed to a smooth, moist texture, without nuts or seeds. Cooked cereals that have a pudding-like consistency, such as cream of wheat or farina. Pureed oatmeal. Pureed, well-cooked pasta and rice. Vegetables Pureed vegetables. Smooth tomato paste or sauce. Mashed or pureed potatoes without skin. Fruits Pureed fruits such as melons and apples without seeds or pulp. Mashed bananas. Mashed avocado. Fruit juices without pulp or seeds. Meats and other protein foods Pureed meat, poultry, and fish. Smooth pate or liverwurst. Smooth souffles. Pureed beans (such as lentils). Pureed eggs. Smooth nut and seed butters. Pureed tofu. Dairy Yogurt. Milk. Pureed cottage cheese. Nutritional dairy drinks or shakes. Cream cheese. Smooth pudding, ice cream, sherbet, and malts. Fats and oils Butter. Margarine. Vegetable oils. Smooth and strained gravy. Sour cream. Mayonnaise. Smooth sauces such as white sauce, cheese sauce, or hollandaise sauce. Sweets and desserts Moistened and pureed cookies and cakes. Whipped topping. Gelatin. Pudding pops. Seasoning and other foods Finely ground spices. Jelly. Honey. Pureed casseroles. Strained soups. Pureed sandwiches. Beverages Anything prepared at the consistency recommended by your dietitian. What foods are not allowed? The items listed may not be a complete list. Talk with your dietitian about what dietary choices are best for you. Grains Oatmeal. Dry cereals. Hard breads. Breads with seeds or nuts. Whole pasta, rice, or other grains. Whole pancakes, waffles, biscuits, muffins, or rolls. Vegetables Whole vegetables. Stringy vegetables (such as celery). Tomatoes or  tomato sauce with seeds. Fried vegetables. Fruits Whole fresh, frozen, canned, or dried fruits that have not been pureed. Stringy fruits, such as pineapple or coconut. Watermelon with seeds. Dried fruit or fruit leather. Meat and other protein foods Whole or ground  meat, fish, or poultry. Dried or cooked lentils or legumes that have been cooked but not mashed or pureed. Non-pureed eggs. Nuts and seeds. Crunchy peanut butter. Whole tofu or other meat alternatives. Dairy Cheese cubes or slices. Non-pureed cottage cheese. Yogurt with fruit chunks. Fats and oils All fats and sauces that have lumps or chunks. Sweets and desserts Solid desserts. Sticky, chewy sweets (such as licorice and caramel). Candy with nuts or coconut. Seasoning and other foods Coarse or seeded herbs and spices. Chunky preserves. Jams with seeds. Whole sandwiches. Non-pureed casseroles. Chunky soups. Summary  Pureed foods can be helpful for people with moderate to severe swallowing problems.  On the dysphagia eating plan, you may eat foods that are soft and have a pudding-like texture. You should avoid foods that you have to chew. If you have to chew the food, then you cannot eat it.  You may be instructed to thicken liquids. Follow your health care provider's instructions about how to do this and to what consistency. This information is not intended to replace advice given to you by your health care provider. Make sure you discuss any questions you have with your health care provider. Document Revised: 11/07/2018 Document Reviewed: 09/19/2016 Elsevier Patient Education  2020 Elsevier Inc.   Dysphagia  Dysphagia is trouble swallowing. This condition occurs when solids and liquids stick in a person's throat on the way down to the stomach, or when food takes longer to get to the stomach than usual. You may have problems swallowing food, liquids, or both. You may also have pain while trying to swallow. It may take you more time and effort to swallow something. What are the causes? This condition may be caused by:  Muscle problems. They may make it difficult for you to move food and liquids through the esophagus, which is the tube that connects your mouth to your stomach.  Blockages.  You may have ulcers, scar tissue, or inflammation that blocks the normal passage of food and liquids. Causes of these problems include: ? Acid reflux from your stomach into your esophagus (gastroesophageal reflux). ? Infections. ? Radiation treatment for cancer. ? Medicines taken without enough fluids to wash them down into your stomach.  Stroke. This can affect the nerves and make it difficult to swallow.  Nerve problems. These prevent signals from being sent to the muscles of your esophagus to squeeze (contract) and move what you swallow down to your stomach.  Globus pharyngeus. This is a common problem that involves a feeling like something is stuck in your throat or a sense of trouble with swallowing, even though nothing is wrong with the swallowing passages.  Certain conditions, such as cerebral palsy or Parkinson's disease. What are the signs or symptoms? Common symptoms of this condition include:  A feeling that solids or liquids are stuck in your throat on the way down to the stomach.  Pain while swallowing.  Coughing or gagging while trying to swallow. Other symptoms include:  Food moving back from your stomach to your mouth (regurgitation).  Noises coming from your throat.  Chest discomfort with swallowing.  A feeling of fullness when swallowing.  Drooling, especially when the throat is blocked.  Heartburn. How is this diagnosed? This condition may be diagnosed  by:  Barium X-ray. In this test, you will swallow a white liquid that sticks to the inside of your esophagus. X-ray images are then taken.  Endoscopy. In this test, a flexible telescope is inserted down your throat to look at your esophagus and your stomach.  CT scans and an MRI. How is this treated? Treatment for dysphagia depends on the cause of this condition, such as:  If the dysphagia is caused by acid reflux or infection, medicines may be used. They may include antibiotics and heartburn  medicines.  If the dysphagia is caused by problems with the muscles, swallowing therapy may be used to help you strengthen your swallowing muscles. You may have to do specific exercises to strengthen the muscles or stretch them.  If the dysphagia is caused by a blockage or mass, procedures to remove the blockage may be done. You may need surgery and a feeding tube. You may need to make diet changes. Ask your health care provider for specific instructions. Follow these instructions at home: Medicines  Take over-the-counter and prescription medicines only as told by your health care provider.  If you were prescribed an antibiotic medicine, take it as told by your health care provider. Do not stop taking the antibiotic even if you start to feel better. Eating and drinking   Follow any diet changes as told by your health care provider.  Work with a diet and nutrition specialist (dietitian) to create an eating plan that will help you get the nutrients you need in order to stay healthy.  Eat soft foods that are easier to swallow.  Cut your food into small pieces and eat slowly. Take small bites.  Eat and drink only when you are sitting upright.  Do not drink alcohol or caffeine. If you need help quitting, ask your health care provider. General instructions  Check your weight every day to make sure you are not losing weight.  Do not use any products that contain nicotine or tobacco, such as cigarettes, e-cigarettes, and chewing tobacco. If you need help quitting, ask your health care provider.  Keep all follow-up visits as told by your health care provider. This is important. Contact a health care provider if you:  Lose weight because you cannot swallow.  Cough when you drink liquids.  Cough up partially digested food. Get help right away if you:  Cannot swallow your saliva.  Have shortness of breath, a fever, or both.  Have a hoarse voice and also have trouble  swallowing. Summary  Dysphagia is trouble swallowing. This condition occurs when solids and liquids stick in a person's throat on the way down to the stomach. You may cough or gag while trying to swallow.  Dysphagia has many possible causes.  Treatment for dysphagia depends on the cause of the condition.  Keep all follow-up visits as told by your health care provider. This is important. This information is not intended to replace advice given to you by your health care provider. Make sure you discuss any questions you have with your health care provider. Document Revised: 12/11/2018 Document Reviewed: 12/11/2018 Elsevier Patient Education  2020 Elsevier Inc.   Distal Femur Fracture, Adult A distal femur fracture is a break in the lower part of the thigh bone (femur) near the knee joint. There are several types of this fracture. They include:  Stable. The bones remain in place after the break.  Displaced. The bones no longer line up after the break.  Comminuted. The bone breaks into  several pieces.  Open. The broken bone comes through the skin. What are the causes? This condition may be caused by:  A fall. This injury can result from the impact of the fall or other violent contact.  A motor vehicle accident.  A sports injury.  Other collisions with a hard surface. What increases the risk? You are more likely to develop this condition if:  You are female.  You are 71-37 years old.  You participate in high-energy sports such as soccer, ice hockey, football, and baseball.  You have a condition that weakens the bones, such as osteoporosis.  You have had a knee replacement. What are the signs or symptoms? Symptoms of this condition include:  Pain.  Swelling.  Bruising.  Inability to bend your knee.  Misshapen knee.  Inability to walk.  Inability to use your injured leg to support your body weight. How is this diagnosed? This condition may be diagnosed based  on:  Your symptoms.  A physical exam.  Other tests, such as: ? Imaging studies, such as an X-ray, CT scan, MRI scan, or ultrasound. ? A procedure to view the inside of your knee with a small camera (arthroscopy). How is this treated? This condition may be treated with:  A splint. You will wear the splint until the swelling goes down.  A cast. This is used to keep the fractured bone from moving while it heals. A cast is usually put on after swelling has gone down.  Surgery. This is done to move the bones back into place. The surgeon will use a combination of screws, metal plates, or rods (hardware) to hold the bones in place.  Physical therapy. Follow these instructions at home: Medicines  Take over-the-counter and prescription medicines only as told by your health care provider.  Do not drive or operate heavy machinery while taking pain medicine.  If you are taking prescription pain medicine, take actions to prevent or treat constipation. Your health care provider may recommend that you: ? Drink enough fluid to keep your urine pale yellow. ? Eat foods that are high in fiber, such as fresh fruits and vegetables, whole grains, and beans. ? Limit foods that are high in fat and processed sugars, such as fried or sweet foods. ? Take an over-the-counter or prescription medicine for constipation. If you have a splint:  Wear the splint as told by your health care provider. Remove it only as told by your health care provider.  Loosen the splint if your toes tingle, become numb, or turn cold and blue.  Keep the splint clean.  If the splint is not waterproof: ? Do not let it get wet. ? Cover it with a watertight covering when you take a bath or a shower. If you have a cast:  Do not stick anything inside the cast to scratch your skin. Doing that increases your risk of infection.  Check the skin around the cast every day. Tell your health care provider about any concerns.  You may  put lotion on dry skin around the edges of the cast. Do not put lotion on the skin underneath the cast.  Keep the cast clean.  If the cast is not waterproof: ? Do not let it get wet. ? Cover it with a watertight covering when you take a bath or a shower. Activity  Do not use your leg to support your body weight until your health care provider says that you can. Follow weight-bearing restrictions.  Use crutches, a  cane, or a walker as directed.  Return to your normal activities as directed by your health care provider. Ask your health care provider what activities are safe for you.  Do not drive until your health care provider approves. Managing pain, stiffness, and swelling   If directed, put ice on the injured area: ? If you have a removable splint, remove it as told by your health care provider. ? Put ice in a plastic bag. ? Place a towel between your skin and the bag. ? Leave the ice on for 20 minutes, 2-3 times a day.  Move your toes often to avoid stiffness and to lessen swelling.  Raise the injured area above the level of your heart while you are lying down. General instructions   Do not put pressure on any part of the cast or splint until it is fully hardened. This may take several hours.  Do not use any products that contain nicotine or tobacco, such as cigarettes and e-cigarettes. These can delay bone healing. If you need help quitting, ask your health care provider.  Keep all follow-up visits as told by your health care provider. This is important. Contact a health care provider if:  You have knee pain and swelling.  You have trouble walking.  Your cast becomes wet or damaged or suddenly feels too tight. Get help right away if:  Your pain and swelling get worse.  You have severe pain below the fracture.  Your skin or toenails turn blue or gray, feel cold, or become numb.  You have fluid, blood, or pus coming from under your cast.  You develop a  fever.  You have pain, swelling, or redness in your leg. Summary  A distal femur fracture is a break in the lower part of the thigh bone (femur).  Falls are the most common causes of femur fractures, but sports-related injuries and motor vehicle accidents also can cause this.  Treatment will depend on characteristics of the fracture, such as location, shape, displacement, and whether the fracture broke through the skin. Options include splinting, casting, or surgery. This information is not intended to replace advice given to you by your health care provider. Make sure you discuss any questions you have with your health care provider. Document Revised: 11/06/2018 Document Reviewed: 09/05/2017 Elsevier Patient Education  [place] on my medicine - XARELTO (Rivaroxaban)  This medication education was reviewed with me or my healthcare representative as part of my discharge preparation.    Why was Xarelto prescribed for you? Xarelto was prescribed for you to reduce the risk of a blood clot forming that can cause a stroke if you have a medical condition called atrial fibrillation (a type of irregular heartbeat).  What do you need to know about xarelto ? Take your Xarelto ONCE DAILY at the same time every day with your evening meal. If you have difficulty swallowing the tablet whole, you may crush it and mix in applesauce just prior to taking your dose.  Take Xarelto exactly as prescribed by your doctor and DO NOT stop taking Xarelto without talking to the doctor who prescribed the medication.  Stopping without other stroke prevention medication to take the place of Xarelto may increase your risk of developing a clot that causes a stroke.  Refill your prescription before you run out.  After discharge, you should have regular check-up appointments with your healthcare provider that is prescribing your Xarelto.  In the future your dose may need to be  changed if your  kidney function or weight changes by a significant amount.  What do you do if you miss a dose? If you are taking Xarelto ONCE DAILY and you miss a dose, take it as soon as you remember on the same day then continue your regularly scheduled once daily regimen the next day. Do not take two doses of Xarelto at the same time or on the same day.   Important Safety Information A possible side effect of Xarelto is bleeding. You should call your healthcare provider right away if you experience any of the following: ? Bleeding from an injury or your nose that does not stop. ? Unusual colored urine (red or dark brown) or unusual colored stools (red or black). ? Unusual bruising for unknown reasons. ? A serious fall or if you hit your head (even if there is no bleeding).  Some medicines may interact with Xarelto and might increase your risk of bleeding while on Xarelto. To help avoid this, consult your healthcare provider or pharmacist prior to using any new prescription or non-prescription medications, including herbals, vitamins, non-steroidal anti-inflammatory drugs (NSAIDs) and supplements.  This website has more information on Xarelto: VisitDestination.com.br.

## 2019-11-12 NOTE — Plan of Care (Signed)
  Problem: Activity: Goal: Ability to ambulate and perform ADLs will improve Outcome: Progressing Note: Up to bathroom with walker and 1 assist tolerating well   Problem: Pain Management: Goal: Pain level will decrease Outcome: Progressing Note: Treated for right hip pain once with oxycodone   Problem: Activity: Goal: Risk for activity intolerance will decrease Outcome: Progressing   Problem: Clinical Measurements: Goal: Respiratory complications will improve Outcome: Progressing Note: Weaned to room air   Problem: Activity: Goal: Risk for activity intolerance will decrease Outcome: Progressing   Problem: Nutrition: Goal: Adequate nutrition will be maintained Outcome: Progressing   Problem: Coping: Goal: Level of anxiety will decrease Outcome: Progressing   Problem: Elimination: Goal: Will not experience complications related to urinary retention Outcome: Progressing

## 2019-11-13 DIAGNOSIS — K222 Esophageal obstruction: Secondary | ICD-10-CM

## 2019-11-13 LAB — GLUCOSE, CAPILLARY
Glucose-Capillary: 201 mg/dL — ABNORMAL HIGH (ref 70–99)
Glucose-Capillary: 160 mg/dL — ABNORMAL HIGH (ref 70–99)
Glucose-Capillary: 133 mg/dL — ABNORMAL HIGH (ref 70–99)
Glucose-Capillary: 164 mg/dL — ABNORMAL HIGH (ref 70–99)
Glucose-Capillary: 135 mg/dL — ABNORMAL HIGH (ref 70–99)

## 2019-11-13 LAB — CBC
HCT: 28.1 % — ABNORMAL LOW (ref 36.0–46.0)
Hemoglobin: 8.8 g/dL — ABNORMAL LOW (ref 12.0–15.0)
MCH: 28.6 pg (ref 26.0–34.0)
MCHC: 31.3 g/dL (ref 30.0–36.0)
MCV: 91.2 fL (ref 80.0–100.0)
Platelets: 118 10*3/uL — ABNORMAL LOW (ref 150–400)
RBC: 3.08 MIL/uL — ABNORMAL LOW (ref 3.87–5.11)
RDW: 13.9 % (ref 11.5–15.5)
WBC: 14.5 10*3/uL — ABNORMAL HIGH (ref 4.0–10.5)
nRBC: 0 % (ref 0.0–0.2)

## 2019-11-13 LAB — BASIC METABOLIC PANEL
Anion gap: 8 (ref 5–15)
BUN: 33 mg/dL — ABNORMAL HIGH (ref 8–23)
CO2: 28 mmol/L (ref 22–32)
Calcium: 8.8 mg/dL — ABNORMAL LOW (ref 8.9–10.3)
Chloride: 99 mmol/L (ref 98–111)
Creatinine, Ser: 1.01 mg/dL — ABNORMAL HIGH (ref 0.44–1.00)
GFR calc Af Amer: 56 mL/min — ABNORMAL LOW (ref >60)
GFR calc non Af Amer: 49 mL/min — ABNORMAL LOW (ref >60)
Glucose, Bld: 180 mg/dL — ABNORMAL HIGH (ref 70–99)
Potassium: 4.3 mmol/L (ref 3.5–5.1)
Sodium: 135 mmol/L (ref 135–145)

## 2019-11-13 LAB — SARS CORONAVIRUS 2 (TAT 6-24 HRS): SARS Coronavirus 2: NEGATIVE

## 2019-11-13 LAB — HEMOGLOBIN A1C
Hgb A1c MFr Bld: 7.7 % — ABNORMAL HIGH (ref 4.8–5.6)
Mean Plasma Glucose: 174.29 mg/dL

## 2019-11-13 MED ORDER — POLYETHYLENE GLYCOL 3350 17 G PO PACK: 17.0000 g | PACK | Freq: Once | ORAL | Status: DC

## 2019-11-13 NOTE — Progress Notes (Addendum)
NCM received SNF authorization - [id number] x 5 days, next review 4/19. Bjorn Pippin CM , fax # [phone] for future clinicals from   [facility]. Gae Gallop RN,BSN,CM [phone]

## 2019-11-13 NOTE — TOC Progression Note (Signed)
Transition of Care [facility]) - Progression Note    Patient Details  Name: Jasmine Jenkins MRN: [id number] Date of Birth: 1926/06/18  Transition of Care [employer]) CM/SW Contact  Epifanio Lesches, RN Phone Number: [phone] 11/13/2019, 10:14 AM  Clinical Narrative:     Pt will transition to [facility] when medically ready. Insurance authorization pending. TOC team will continue to monitor and follow for needs. Pt will need updated COVID if d/c after today, 11/13/2019.  Expected Discharge Plan: Skilled Nursing Facility Barriers to Discharge: Continued Medical Work up  Expected Discharge Plan and Services Expected Discharge Plan: Skilled Nursing Facility      Social Determinants of Health (SDOH) Interventions    Readmission Risk Interventions No flowsheet data found.

## 2019-11-13 NOTE — Progress Notes (Signed)
Physical Therapy Treatment Patient Details Name: Jasmine Jenkins MRN: [id number] DOB: May 11, 1926 Today's Date: 11/13/2019    History of Present Illness 86 yo female with onset of mechanical fall in her yard was admitted for IM nailing of R femoral intertrochanteric fracture.  PMHx:  OA, chronic back pain, DM, pulm HTN, PVD, LE edema, hiatal hernia, sepsis, renal artery stenosis,    PT Comments    Pt motivated to participate in PT/OT session, seen together for mobility progression. Pt required less physical assist for bed mobility and transfer to and from [facility] this day, min +2 for safety and steadying. Pt progressed to gait training with mod +2 assist, with multimodal cuing and facilitation for progression and stability of RLE. Pt limited by fatigue and RLE weakness. PT continuing to recommend SNF post-acutely to address mobility deficits and return pt to PLOF prior to return home. Will continue to follow acutely.    Follow Up Recommendations  SNF     Equipment Recommendations  Rolling walker with 5" wheels    Recommendations for Other Services       Precautions / Restrictions Precautions Precautions: Fall Restrictions Weight Bearing Restrictions: No Other Position/Activity Restrictions: wbat    Mobility  Bed Mobility Overal bed mobility: Needs Assistance Bed Mobility: Supine to Sit     Supine to sit: Min assist;+2 for safety/equipment;HOB elevated;+2 for physical assistance     General bed mobility comments: min assist +2 for trunk elevation, walking LEs to EOB. Pt with use of bedrails, verbal cuing for use of bedrails and sequencing.  Transfers Overall transfer level: Needs assistance Equipment used: Rolling walker (2 wheeled);1 person hand held assist Transfers: Sit to/from [employer] Sit to Stand: Min assist;+2 safety/equipment Stand pivot transfers: Min assist;From elevated surface;+2 safety/equipment       General transfer comment: Min assist +2  for safety for power up, multimodal cuing for upright posture at both sacrum and sternum. Increased time to rise and steady, pt taking pivotal steps to [facility] for toileting with min assist to steady and guide pt/RW.  Ambulation/Gait Ambulation/Gait assistance: Mod assist;+2 safety/equipment;+2 physical assistance Gait Distance (Feet): 6 Feet Assistive device: Rolling walker (2 wheeled) Gait Pattern/deviations: Step-to pattern;Decreased step length - right;Decreased weight shift to right;Antalgic;Trunk flexed;Narrow base of support Gait velocity: decr   General Gait Details: Mod assist +2 for gait for lateral weight shifting, facilitating RLE during swing phase, blocking R knee during stance phase, guiding both RW and pt, and truncal support to steady. Pt with mild to moderate RLE buckling with fatigue. Verbal cuing for placement in RW, upright posture, and appropriate BOS.   Stairs             Wheelchair Mobility    Modified Rankin (Stroke Patients Only)       Balance Overall balance assessment: History of Falls;Needs assistance Sitting-balance support: Feet supported;Bilateral upper extremity supported Sitting balance-Leahy Scale: Fair     Standing balance support: Bilateral upper extremity supported;During functional activity Standing balance-Leahy Scale: Poor Standing balance comment: reliant on external support                            Cognition Arousal/Alertness: Awake/alert Behavior During Therapy: WFL for tasks assessed/performed Overall Cognitive Status: Within Functional Limits for tasks assessed  General Comments: Pt pleasant, engaging with PT/OT      Exercises      General Comments General comments (skin integrity, edema, etc.): pt's daughter at bedside      Pertinent Vitals/Pain Pain Assessment: Faces Faces Pain Scale: Hurts even more Pain Location: right hip Pain Descriptors / Indicators:  Operative site guarding;Grimacing;Guarding Pain Intervention(s): Limited activity within patient's tolerance;Monitored during session;Premedicated before session;Repositioned    Home Living                      Prior Function            PT Goals (current goals can now be found in the care plan section) Acute Rehab PT Goals Patient Stated Goal: to get R leg stronger PT Goal Formulation: With patient/family Time For Goal Achievement: 11/25/19 Potential to Achieve Goals: Good Progress towards PT goals: Progressing toward goals    Frequency    Min 2X/week      PT Plan Current plan remains appropriate    Co-evaluation PT/OT/SLP Co-Evaluation/Treatment: Yes Reason for Co-Treatment: For patient/therapist safety;To address functional/ADL transfers PT goals addressed during session: Mobility/safety with mobility;Proper use of DME        AM-PAC PT "6 Clicks" Mobility   Outcome Measure  Help needed turning from your back to your side while in a flat bed without using bedrails?: A Little Help needed moving from lying on your back to sitting on the side of a flat bed without using bedrails?: A Little Help needed moving to and from a bed to a chair (including a wheelchair)?: A Little Help needed standing up from a chair using your arms (e.g., wheelchair or bedside chair)?: A Little Help needed to walk in hospital room?: A Lot Help needed climbing 3-5 steps with a railing? : Total 6 Click Score: 15    End of Session Equipment Utilized During Treatment: Gait belt Activity Tolerance: Patient limited by fatigue;Patient limited by pain Patient left: with call bell/phone within reach;with family/visitor present;in chair;with chair alarm set Nurse Communication: Mobility status PT Visit Diagnosis: Unsteadiness on feet (R26.81);Muscle weakness (generalized) (M62.81);Pain Pain - Right/Left: Right Pain - part of body: Hip     Time: 1610-9604 PT Time Calculation (min) (ACUTE  ONLY): 19 min  Charges:  $Gait Training: 8-22 mins                     Jasmine Jenkins E, PT Acute Rehabilitation Services Pager [phone]  Office [phone]    [employer] D Despina Hidden 11/13/2019, 12:22 PM

## 2019-11-13 NOTE — Progress Notes (Signed)
PROGRESS NOTE    Jasmine Jenkins  ZOX:[id number] DOB: January 23, 1926 DOA: 11/10/2019 PCP: Kirby FunkGriffin, John, MD    Brief Narrative:  Jasmine Jenkins is a 86 y.o. female with medical history significant ofpermanent A. fib on Xarelto, hypertension, diabetes type 2 who presents to the emergency department for the evaluation of right hip pain. Patient is pretty independent and lives by herself. She was working in her yard and she was preparing to [place] her lawn whenshe had a mechanical fall today, followed by immediate pain and decreased mobility of her right hip. She did not strike her head on the ground. She did not lose any consciousness. She was unable to ambulate after she fell. EMS arrived and she was given 100 mcg of fentanyl en route.  She follows with Dr. Johney FrameAllred for her A. fib. She is on Xarelto for anticoagulation and takes metoprolol for rate control. Her son lives next door.   Assessment & Plan:   Principal Problem:   Closed intertrochanteric fracture of right hip (HCC) Active Problems:   HYPERTENSION, BENIGN   Atrial fibrillation (HCC)   Closed right hip fracture (HCC)   Hip fracture (HCC)   Right hip fracture Patient presenting to ED following mechanical fall at home.  X-ray notable for right intratrochanteric fracture with slight varus angulation.  Patient underwent ORIF with cephalomedullary nail by Dr. Susa SimmondsAdair on 11/11/2019. --Restarted home Xarelto today, okay per orthopedics --PT recommends SNF placement, accepted [facility], await insurance authorization --2-week follow-up orthopedics for x-rays right hip and wound check --Oxycodone q6h prn for moderate/severe pain, will try to avoid given her advanced age  Essential hypertension --Continue losartan 50 mg p.o. daily, metoprolol succinate 50 g p.o. daily  Permanent atrial fibrillation Continue metoprolol succinate 50 mg p.o. daily for rate control. --Resumed home Xarelto  Type 2 diabetes mellitus On  glimepiride 2 mg p.o. daily at home.  Leukocytosis Unclear etiology, but high suspicion for reactive given fracture as above.  History of dysphagia Follows [facility], Dr. Bosie ClosSchooler.  Has undergone esophageal dilations in the past. Usually tolerates a regular diet in the a.m. but by the evening has more trouble and usually takes and only liquids such as Ensure.  Esophagram noted severe diffuse esophageal spasms with tight esophageal stricture.  Discussed case with gastroenterology, Dr. Dulce Sellarutlaw; he recommends outpatient follow-up; he will arrange since she is still recovering from her hip surgery and has been restarted on Xarelto.  DVT prophylaxis: Xarelto Code Status: Full Code Family Communication: Daughter present at bedside Disposition Plan:       Status is: Inpatient  Remains inpatient appropriate because:Ongoing active pain requiring inpatient pain management, Unsafe d/c plan and Inpatient level of care appropriate due to severity of illness   Dispo: The patient is from: Home              Anticipated d/c is to: SNF [facility]              Anticipated d/c date is: 1 day              Patient currently is medically stable to d/c.    Consultants:   Orthopedics, Dr. Reginia NaasAdair  [facility] - Dr. Dulce Sellarutlaw  Procedures:  Cephalomedullary fixation of right intertrochanteric femur fracture on 11/11/2019  Antimicrobials:   Perioperative clindamycin   Subjective: Patient seen and examined bedside, resting comfortably.  States pain is fairly well controlled.  Esophagram yesterday notable for esophageal stricture, discussed with GI this  morning Dr Paulita Fujita who recommend outpatient follow-up once she recovers from her acute hip fracture. No other complaints or concerns at this time.  Denies headache, no visual changes, no chest pain, no palpitations, no shortness of breath, no abdominal pain.  No acute events overnight per nursing staff.  Objective: Vitals:     11/12/19 2130 11/13/19 0326 11/13/19 0830 11/13/19 0934  BP: 99/60 99/60 (!) 123/55 (!) 123/55  Pulse: 80 74 94   Resp: 16 15 14    Temp: 98.9 F (37.2 C) 98.3 F (36.8 C) 98.3 F (36.8 C)   TempSrc: Oral Oral Oral   SpO2: 94% 97% 99%   Weight:      Height:        Intake/Output Summary (Last 24 hours) at 11/13/2019 1203 Last data filed at 11/12/2019 1700 Gross per 24 hour  Intake 580 ml  Output --  Net 580 ml   Filed Weights   11/10/19 1435 11/10/19 2200  Weight: 57.2 kg 57.8 kg    Examination:  General exam: Appears calm and comfortable  Respiratory system: Clear to auscultation. Respiratory effort normal.  Oxygenating well on room air. Cardiovascular system: S1 & S2 heard, RRR. No JVD, murmurs, rubs, gallops or clicks. No pedal edema. Gastrointestinal system: Abdomen is nondistended, soft and nontender. No organomegaly or masses felt. Normal bowel sounds heard. Central nervous system: Alert and oriented. No focal neurological deficits. Extremities: Symmetric 5 x 5 power. Skin: No rashes, lesions or ulcers, surgical dressing noted in place, clean/dry/intact Psychiatry: Judgement and insight appear normal. Mood & affect appropriate.     Data Reviewed: I have personally reviewed following labs and imaging studies  CBC: Recent Labs  Lab 11/10/19 1536 11/12/19 0227 11/13/19 0512  WBC 18.3* 12.2* 14.5*  NEUTROABS 16.4*  --   --   HGB 12.6 9.2* 8.8*  HCT 41.2 29.4* 28.1*  MCV 93.0 91.9 91.2  PLT 169 120* 426*   Basic Metabolic Panel: Recent Labs  Lab 11/10/19 1536 11/12/19 0227 11/13/19 0512  NA 133* 134* 135  K 4.9 4.1 4.3  CL 101 98 99  CO2 23 27 28   GLUCOSE 259* 275* 180*  BUN 19 25* 33*  CREATININE 0.63 1.05* 1.01*  CALCIUM 8.9 8.8* 8.8*   GFR: Estimated Creatinine Clearance: 32.6 mL/min (A) (by C-G formula based on SCr of 1.01 mg/dL (H)). Liver Function Tests: No results for input(s): AST, ALT, ALKPHOS, BILITOT, PROT, ALBUMIN in the last 168  hours. No results for input(s): LIPASE, AMYLASE in the last 168 hours. No results for input(s): AMMONIA in the last 168 hours. Coagulation Profile: Recent Labs  Lab 11/10/19 1536  INR 1.2   Cardiac Enzymes: No results for input(s): CKTOTAL, CKMB, CKMBINDEX, TROPONINI in the last 168 hours. BNP (last 3 results) No results for input(s): PROBNP in the last 8760 hours. HbA1C: Recent Labs    11/13/19 0512  HGBA1C 7.7*   CBG: Recent Labs  Lab 11/12/19 1204 11/12/19 1638 11/12/19 2059 11/13/19 0648 11/13/19 1135  GLUCAP 165* 140* 174* 160* 133*   Lipid Profile: No results for input(s): CHOL, HDL, LDLCALC, TRIG, CHOLHDL, LDLDIRECT in the last 72 hours. Thyroid Function Tests: No results for input(s): TSH, T4TOTAL, FREET4, T3FREE, THYROIDAB in the last 72 hours. Anemia Panel: No results for input(s): VITAMINB12, FOLATE, FERRITIN, TIBC, IRON, RETICCTPCT in the last 72 hours. Sepsis Labs: No results for input(s): PROCALCITON, LATICACIDVEN in the last 168 hours.  Recent Results (from the past 240 hour(s))  Respiratory Panel by RT  PCR (Flu A&B, Covid) - Nasopharyngeal Swab     Status: None   Collection Time: 11/10/19  3:47 PM   Specimen: Nasopharyngeal Swab  Result Value Ref Range Status   SARS Coronavirus 2 by RT PCR NEGATIVE NEGATIVE Final    Comment: (NOTE) SARS-CoV-2 target nucleic acids are NOT DETECTED. The SARS-CoV-2 RNA is generally detectable in upper respiratoy specimens during the acute phase of infection. The lowest concentration of SARS-CoV-2 viral copies this assay can detect is 131 copies/mL. A negative result does not preclude SARS-Cov-2 infection and should not be used as the sole basis for treatment or other patient management decisions. A negative result may occur with  improper specimen collection/handling, submission of specimen other than nasopharyngeal swab, presence of viral mutation(s) within the areas targeted by this assay, and inadequate number of  viral copies (<131 copies/mL). A negative result must be combined with clinical observations, patient history, and epidemiological information. The expected result is Negative. Fact Sheet for Patients:  https://www.moore.com/ Fact Sheet for Healthcare Providers:  https://www.young.biz/ This test is not yet ap proved or cleared by the [place] FDA and  has been authorized for detection and/or diagnosis of SARS-CoV-2 by FDA under an Emergency Use Authorization (EUA). This EUA will remain  in effect (meaning this test can be used) for the duration of the COVID-19 declaration under Section 564(b)(1) of the Act, 21 U.S.C. section 360bbb-3(b)(1), unless the authorization is terminated or revoked sooner.    Influenza A by PCR NEGATIVE NEGATIVE Final   Influenza B by PCR NEGATIVE NEGATIVE Final    Comment: (NOTE) The Xpert Xpress SARS-CoV-2/FLU/RSV assay is intended as an aid in  the diagnosis of influenza from Nasopharyngeal swab specimens and  should not be used as a sole basis for treatment. Nasal washings and  aspirates are unacceptable for Xpert Xpress SARS-CoV-2/FLU/RSV  testing. Fact Sheet for Patients: https://www.moore.com/ Fact Sheet for Healthcare Providers: https://www.young.biz/ This test is not yet approved or cleared by the [place] FDA and  has been authorized for detection and/or diagnosis of SARS-CoV-2 by  FDA under an Emergency Use Authorization (EUA). This EUA will remain  in effect (meaning this test can be used) for the duration of the  Covid-19 declaration under Section 564(b)(1) of the Act, 21  U.S.C. section 360bbb-3(b)(1), unless the authorization is  terminated or revoked. Performed at [facility], [address] [place], [place] [address]   Surgical pcr screen     Status: None   Collection Time: 11/10/19  9:27 PM   Specimen: Nasal Mucosa; Nasal Swab    Result Value Ref Range Status   MRSA, PCR NEGATIVE NEGATIVE Final   Staphylococcus aureus NEGATIVE NEGATIVE Final    Comment: (NOTE) The Xpert SA Assay (FDA approved for NASAL specimens in patients 51 years of age and older), is one component of a comprehensive surveillance program. It is not intended to diagnose infection nor to guide or monitor treatment. Performed at [facility], [address] [place], [place] [address]          Radiology Studies: DG C-Arm 1-60 Min  Result Date: 11/11/2019 CLINICAL DATA:  Proximal right femur fracture EXAM: RIGHT FEMUR 2 VIEWS; DG C-ARM 1-60 MIN COMPARISON:  11/10/2019 FLUOROSCOPY TIME:  Radiation Exposure Index (as provided by the fluoroscopic device): Not available If the device does not provide the exposure index: Fluoroscopy Time:  47 seconds Number of Acquired Images:  4 FINDINGS: Medullary rod and proximal fixation screw is noted.  A mid femoral fixation screws seen as well. Fracture fragments are in near anatomic alignment. IMPRESSION: ORIF of proximal right femoral fracture. Electronically Signed   By: Alcide Clever M.D.   On: 11/11/2019 15:12   ECHOCARDIOGRAM COMPLETE  Result Date: 11/11/2019    ECHOCARDIOGRAM REPORT   Patient Name:   Jasmine Jenkins Date of Exam: 11/11/2019 Medical Rec #:  [id number]       Height:       65.0 in Accession #:    [id number]      Weight:       127.4 lb Date of Birth:  Oct 22, 1925       BSA:          1.633 m Patient Age:    89 years        BP:           114/68 mmHg Patient Gender: F               HR:           105 bpm. Exam Location:  Inpatient Procedure: 2D Echo Indications:    CHF-Acute diastolic 428.31  History:        Patient has prior history of Echocardiogram examinations, most                 recent 10/23/2011. Arrythmias:Atrial Fibrillation; Risk                 Factors:Hypertension and Diabetes.  Sonographer:    Thurman Coyer RDCS (AE) Referring Phys: [id number] AMRIT ADHIKARI IMPRESSIONS  1. Left  ventricular ejection fraction, by estimation, is 60 to 65%. The left ventricle has normal function. The left ventricle has no regional wall motion abnormalities. There is mild concentric left ventricular hypertrophy. Left ventricular diastolic function could not be evaluated.  2. Right ventricular systolic function is mildly reduced. The right ventricular size is mildly enlarged. Mildly increased right ventricular wall thickness. There is moderately elevated pulmonary artery systolic pressure. The estimated right ventricular systolic pressure is 58.1 mmHg.  3. Left atrial size was severely dilated.  4. Right atrial size was severely dilated.  5. The mitral valve is normal in structure. No evidence of mitral valve regurgitation.  6. Tricuspid valve regurgitation is moderate to severe.  7. The aortic valve is tricuspid. Aortic valve regurgitation is not visualized. Mild aortic valve stenosis.  8. The inferior vena cava is dilated in size with >50% respiratory variability, suggesting right atrial pressure of 8 mmHg. Comparison(s): Prior images unable to be directly viewed, comparison made by report only. Pulmonary hypertension is worse , otherwise little change. FINDINGS  Left Ventricle: Left ventricular ejection fraction, by estimation, is 60 to 65%. The left ventricle has normal function. The left ventricle has no regional wall motion abnormalities. The left ventricular internal cavity size was normal in size. There is  mild concentric left ventricular hypertrophy. Left ventricular diastolic function could not be evaluated due to atrial fibrillation. Left ventricular diastolic function could not be evaluated. Right Ventricle: The right ventricular size is mildly enlarged. Mildly increased right ventricular wall thickness. Right ventricular systolic function is mildly reduced. There is moderately elevated pulmonary artery systolic pressure. The tricuspid regurgitant velocity is 3.54 m/s, and with an assumed right  atrial pressure of 8 mmHg, the estimated right ventricular systolic pressure is 58.1 mmHg. Left Atrium: Left atrial size was severely dilated. Right Atrium: Right atrial size was severely dilated. Pericardium: There is no evidence of pericardial effusion. Mitral Valve: The  mitral valve is normal in structure. No evidence of mitral valve regurgitation. Tricuspid Valve: The tricuspid valve is normal in structure. Tricuspid valve regurgitation is moderate to severe. Aortic Valve: The aortic valve is tricuspid. . There is moderate thickening and moderate calcification of the aortic valve. Aortic valve regurgitation is not visualized. Mild aortic stenosis is present. There is moderate thickening of the aortic valve. There is moderate calcification of the aortic valve. Aortic valve mean gradient measures 8.7 mmHg. Aortic valve peak gradient measures 16.5 mmHg. Aortic valve area, by VTI measures 1.25 cm. Pulmonic Valve: The pulmonic valve was grossly normal. Pulmonic valve regurgitation is trivial. Aorta: The aortic root and ascending aorta are structurally normal, with no evidence of dilitation. Venous: The inferior vena cava is dilated in size with greater than 50% respiratory variability, suggesting right atrial pressure of 8 mmHg. IAS/Shunts: No atrial level shunt detected by color flow Doppler.  LEFT VENTRICLE PLAX 2D LVIDd:         2.90 cm LVIDs:         2.30 cm LV PW:         1.20 cm LV IVS:        1.30 cm LVOT diam:     2.00 cm LV SV:         45 LV SV Index:   28 LVOT Area:     3.14 cm  LEFT ATRIUM              Index       RIGHT ATRIUM           Index LA diam:        3.80 cm  2.33 cm/m  RA Area:     19.40 cm LA Vol (A2C):   119.0 ml 72.86 ml/m RA Volume:   51.60 ml  31.59 ml/m LA Vol (A4C):   70.4 ml  43.10 ml/m LA Biplane Vol: 92.3 ml  56.51 ml/m  AORTIC VALVE AV Area (Vmax):    1.25 cm AV Area (Vmean):   1.17 cm AV Area (VTI):     1.25 cm AV Vmax:           203.00 cm/s AV Vmean:          138.333 cm/s  AV VTI:            0.360 m AV Peak Grad:      16.5 mmHg AV Mean Grad:      8.7 mmHg LVOT Vmax:         80.83 cm/s LVOT Vmean:        51.333 cm/s LVOT VTI:          0.143 m LVOT/AV VTI ratio: 0.40  AORTA Ao Root diam: 3.00 cm TRICUSPID VALVE TR Peak grad:   50.1 mmHg TR Vmax:        354.00 cm/s  SHUNTS Systemic VTI:  0.14 m Systemic Diam: 2.00 cm Rachelle Hora Croitoru MD Electronically signed by Thurmon Fair MD Signature Date/Time: 11/11/2019/4:21:33 PM    Final    DG FEMUR, MIN 2 VIEWS RIGHT  Result Date: 11/11/2019 CLINICAL DATA:  Proximal right femur fracture EXAM: RIGHT FEMUR 2 VIEWS; DG C-ARM 1-60 MIN COMPARISON:  11/10/2019 FLUOROSCOPY TIME:  Radiation Exposure Index (as provided by the fluoroscopic device): Not available If the device does not provide the exposure index: Fluoroscopy Time:  47 seconds Number of Acquired Images:  4 FINDINGS: Medullary rod and proximal fixation screw is noted. A mid femoral fixation screws seen  as well. Fracture fragments are in near anatomic alignment. IMPRESSION: ORIF of proximal right femoral fracture. Electronically Signed   By: Alcide Clever M.D.   On: 11/11/2019 15:12   DG ESOPHAGUS W SINGLE CM (SOL OR THIN BA)  Result Date: 11/12/2019 CLINICAL DATA:  Dysphagia. EXAM: ESOPHOGRAM/BARIUM SWALLOW TECHNIQUE: Single contrast examination was performed using  thin barium. FLUOROSCOPY TIME:  Fluoroscopy Time:  2 minutes and 26 seconds Radiation Exposure Index (if provided by the fluoroscopic device): 36.4 mGy Number of Acquired Spot Images: 0 COMPARISON:  08/22/2017 FINDINGS: Severe diffuse esophageal spasm as demonstrated on prior study. Very tight distal esophageal stricture very similar to the prior study with smooth strictured narrowing and very little passage of barium into the stomach. Persistent small hiatal hernia. No findings for laryngeal penetration or aspiration. IMPRESSION: 1. Severe diffuse esophageal spasm with a very tight distal esophageal stricture very similar  to the prior study. 2. Persistent small hiatal hernia. Electronically Signed   By: Rudie Meyer M.D.   On: 11/12/2019 13:16        Scheduled Meds: . docusate sodium  100 mg Oral BID  . insulin aspart  0-5 Units Subcutaneous QHS  . insulin aspart  0-9 Units Subcutaneous TID WC  . losartan  50 mg Oral Daily  . metoprolol succinate  50 mg Oral Daily  . polyethylene glycol  17 g Oral Daily  . polyethylene glycol  17 g Oral Once  . Rivaroxaban  15 mg Oral Q supper  . senna  1 tablet Oral BID  . vitamin B-12  1,000 mcg Oral Daily   Continuous Infusions:   LOS: 3 days    Time spent: 35 minutes spent on chart review, discussion with nursing staff, consultants, updating family and interview/physical exam; more than 50% of that time was spent in counseling and/or coordination of care.    Alvira Philips [place], DO [facility] Available via Epic secure chat 7am-7pm After these hours, please refer to coverage provider listed on [email or web address] 11/13/2019, 12:03 PM

## 2019-11-13 NOTE — Evaluation (Signed)
Occupational Therapy Evaluation Patient Details Name: Jasmine Jenkins MRN: [id number] DOB: 12/01/1925 Today's Date: 11/13/2019    History of Present Illness 86 yo female with onset of mechanical fall in her yard was admitted for IM nailing of R femoral intertrochanteric fracture.  PMHx:  OA, chronic back pain, DM, pulm HTN, PVD, LE edema, hiatal hernia, sepsis, renal artery stenosis,   Clinical Impression   Pt admitted with above. She demonstrates the below listed deficits and will benefit from continued OT to maximize safety and independence with BADLs. Pt presents to OT with increased pain, decreased activity tolerance, impaired balance, and generalized weakness.  She currently requires set up assist - max A for ADLs and min - mod A +2 for functional mobility. Pt lived alone PTA, and was fully independent with ADLs.  Recommend SNF level rehab at discharge.       Follow Up Recommendations  SNF;Supervision/Assistance - 24 hour    Equipment Recommendations  None recommended by OT    Recommendations for Other Services       Precautions / Restrictions Precautions Precautions: Fall Restrictions Weight Bearing Restrictions: No Other Position/Activity Restrictions: wbat      Mobility Bed Mobility Overal bed mobility: Needs Assistance Bed Mobility: Supine to Sit     Supine to sit: Min assist;+2 for safety/equipment;HOB elevated;+2 for physical assistance     General bed mobility comments: min assist +2 for trunk elevation, walking LEs to EOB. Pt with use of bedrails, verbal cuing for use of bedrails and sequencing.  Transfers Overall transfer level: Needs assistance Equipment used: Rolling walker (2 wheeled);1 person hand held assist Transfers: Sit to/from [facility] Sit to Stand: Min assist;+2 safety/equipment Stand pivot transfers: Min assist;From elevated surface;+2 safety/equipment       General transfer comment: Min assist +2 for safety for power up,  multimodal cuing for upright posture at both sacrum and sternum. Increased time to rise and steady, pt taking pivotal steps to [facility] for toileting with min assist to steady and guide pt/RW.    Balance Overall balance assessment: History of Falls;Needs assistance Sitting-balance support: Feet supported;Bilateral upper extremity supported Sitting balance-Leahy Scale: Good Sitting balance - Comments: able to bend forward to access Rt ankle    Standing balance support: Bilateral upper extremity supported;During functional activity Standing balance-Leahy Scale: Poor Standing balance comment: reliant on external support                           ADL either performed or assessed with clinical judgement   ADL Overall ADL's : Needs assistance/impaired Eating/Feeding: Independent   Grooming: Wash/dry hands;Wash/dry face;Oral care;Brushing hair;Set up;Sitting   Upper Body Bathing: Set up;Sitting   Lower Body Bathing: Moderate assistance;Sit to/from stand   Upper Body Dressing : Sitting;Minimal assistance   Lower Body Dressing: Maximal assistance;Sit to/from stand Lower Body Dressing Details (indicate cue type and reason): able to bend forward to touch ankle  Toilet Transfer: Minimal assistance;Stand-pivot;+2 for safety/equipment;BSC;RW Toilet Transfer Details (indicate cue type and reason): able to pivot to [facility] with min A +2  Toileting- Clothing Manipulation and Hygiene: Moderate assistance;Sit to/from stand       Functional mobility during ADLs: Moderate assistance;+2 for physical assistance;+2 for safety/equipment;Rolling walker       Vision         Perception     Praxis      Pertinent Vitals/Pain Pain Assessment: Faces Faces Pain Scale: Hurts even more Pain Location: right hip  Pain Descriptors / Indicators: Operative site guarding;Grimacing;Guarding Pain Intervention(s): Monitored during session;Limited activity within patient's tolerance     Hand Dominance  Right   Extremity/Trunk Assessment Upper Extremity Assessment Upper Extremity Assessment: Generalized weakness   Lower Extremity Assessment Lower Extremity Assessment: Defer to PT evaluation   Cervical / Trunk Assessment Cervical / Trunk Assessment: Kyphotic   Communication Communication Communication: HOH   Cognition Arousal/Alertness: Awake/alert Behavior During Therapy: WFL for tasks assessed/performed Overall Cognitive Status: Within Functional Limits for tasks assessed                                     General Comments  Pt's daughter present for eval     Exercises     Shoulder Instructions      Home Living Family/patient expects to be discharged to:: Private residence Living Arrangements: Alone Available Help at Discharge: Family;Available PRN/intermittently Type of Home: House Home Access: Ramped entrance     Home Layout: One level         Bathroom Toilet: Standard     Home Equipment: None   Additional Comments: has been home caring for herself, mowing grass      Prior Functioning/Environment Level of Independence: Independent        Comments: has some chronic pain        OT Problem List: Decreased activity tolerance;Impaired balance (sitting and/or standing);Decreased safety awareness;Decreased knowledge of use of DME or AE;Pain      OT Treatment/Interventions: Self-care/ADL training;DME and/or AE instruction;Therapeutic activities;Patient/family education;Balance training;Therapeutic exercise    OT Goals(Current goals can be found in the care plan section) Acute Rehab OT Goals Patient Stated Goal: to have less pain  OT Goal Formulation: With patient Time For Goal Achievement: 11/27/19 Potential to Achieve Goals: Good ADL Goals Pt Will Perform Grooming: with min assist;standing Pt Will Perform Lower Body Bathing: with min assist;sit to/from stand Pt Will Perform Lower Body Dressing: with min assist;sit to/from stand Pt  Will Transfer to Toilet: with min assist;ambulating;regular height toilet;bedside commode;grab bars Pt Will Perform Toileting - Clothing Manipulation and hygiene: with min assist;sit to/from stand  OT Frequency: Min 2X/week   Barriers to D/C: Decreased caregiver support          Co-evaluation PT/OT/SLP Co-Evaluation/Treatment: Yes Reason for Co-Treatment: For patient/therapist safety;To address functional/ADL transfers   OT goals addressed during session: ADL's and self-care      AM-PAC OT "6 Clicks" Daily Activity     Outcome Measure Help from another person eating meals?: None Help from another person taking care of personal grooming?: A Little Help from another person toileting, which includes using toliet, bedpan, or urinal?: A Lot Help from another person bathing (including washing, rinsing, drying)?: A Lot Help from another person to put on and taking off regular upper body clothing?: A Little Help from another person to put on and taking off regular lower body clothing?: A Lot 6 Click Score: 16   End of Session Equipment Utilized During Treatment: Rolling walker;Gait belt Nurse Communication: Mobility status  Activity Tolerance: Patient limited by pain Patient left: in chair;with call bell/phone within reach;with chair alarm set;with family/visitor present  OT Visit Diagnosis: Pain Pain - Right/Left: Right Pain - part of body: Hip                Time: 3154-0086 OT Time Calculation (min): 28 min Charges:  OT General Charges $OT Visit: 1 Visit OT  Evaluation $OT Eval Moderate Complexity: 1 Mod  Eber Jones., OTR/L Acute Rehabilitation Services Pager [phone] Office [phone]   Jeani Hawking M 11/13/2019, 5:50 PM

## 2019-11-13 NOTE — Plan of Care (Signed)
  Problem: Pain Managment: Goal: General experience of comfort will improve Outcome: Progressing   Problem: Safety: Goal: Ability to remain free from injury will improve Outcome: Progressing   Problem: Skin Integrity: Goal: Risk for impaired skin integrity will decrease Outcome: Progressing   

## 2019-11-13 NOTE — Plan of Care (Signed)
  Problem: Activity: Goal: Ability to ambulate and perform ADLs will improve Outcome: Progressing   Problem: Pain Management: Goal: Pain level will decrease Outcome: Progressing   Problem: Activity: Goal: Risk for activity intolerance will decrease Outcome: Progressing   Problem: Safety: Goal: Ability to remain free from injury will improve Outcome: Progressing   Problem: Education: Goal: Knowledge of General Education information will improve Description: Including pain rating scale, medication(s)/side effects and non-pharmacologic comfort measures Outcome: Progressing   Problem: Health Behavior/Discharge Planning: Goal: Ability to manage health-related needs will improve Outcome: Progressing   Problem: Clinical Measurements: Goal: Ability to maintain clinical measurements within normal limits will improve Outcome: Progressing Goal: Will remain free from infection Outcome: Progressing Goal: Diagnostic test results will improve Outcome: Progressing Goal: Respiratory complications will improve Outcome: Progressing Goal: Cardiovascular complication will be avoided Outcome: Progressing   Problem: Activity: Goal: Risk for activity intolerance will decrease Outcome: Progressing   Problem: Nutrition: Goal: Adequate nutrition will be maintained Outcome: Progressing   Problem: Coping: Goal: Level of anxiety will decrease Outcome: Progressing   Problem: Elimination: Goal: Will not experience complications related to bowel motility Outcome: Progressing Goal: Will not experience complications related to urinary retention Outcome: Progressing   Problem: Pain Managment: Goal: General experience of comfort will improve Outcome: Progressing   Problem: Safety: Goal: Ability to remain free from injury will improve Outcome: Progressing   Problem: Skin Integrity: Goal: Risk for impaired skin integrity will decrease Outcome: Progressing   

## 2019-11-14 DIAGNOSIS — K224 Dyskinesia of esophagus: Secondary | ICD-10-CM

## 2019-11-14 LAB — GLUCOSE, CAPILLARY
Glucose-Capillary: 148 mg/dL — ABNORMAL HIGH (ref 70–99)
Glucose-Capillary: 175 mg/dL — ABNORMAL HIGH (ref 70–99)
Glucose-Capillary: 129 mg/dL — ABNORMAL HIGH (ref 70–99)
Glucose-Capillary: 143 mg/dL — ABNORMAL HIGH (ref 70–99)

## 2019-11-14 MED ORDER — SODIUM CHLORIDE 0.9 % IV SOLN: INTRAVENOUS | Status: DC

## 2019-11-14 MED ORDER — BISACODYL 10 MG RE SUPP
10.0000 mg | Freq: Once | RECTAL | Status: AC
  Administered 2019-11-14: 10 mg via RECTAL
  Filled 2019-11-14: qty 1

## 2019-11-14 NOTE — Plan of Care (Signed)
  Problem: Activity: Goal: Ability to ambulate and perform ADLs will improve Outcome: Progressing   Problem: Pain Management: Goal: Pain level will decrease Outcome: Progressing   Problem: Safety: Goal: Ability to remain free from injury will improve Outcome: Progressing   Problem: Education: Goal: Knowledge of General Education information will improve Description: Including pain rating scale, medication(s)/side effects and non-pharmacologic comfort measures Outcome: Progressing   Problem: Health Behavior/Discharge Planning: Goal: Ability to manage health-related needs will improve Outcome: Progressing   Problem: Clinical Measurements: Goal: Ability to maintain clinical measurements within normal limits will improve Outcome: Progressing   Problem: Activity: Goal: Risk for activity intolerance will decrease Outcome: Progressing   Problem: Nutrition: Goal: Adequate nutrition will be maintained Outcome: Progressing

## 2019-11-14 NOTE — Plan of Care (Signed)
  Problem: Activity: Goal: Ability to ambulate and perform ADLs will improve Outcome: Progressing   Problem: Pain Management: Goal: Pain level will decrease Outcome: Progressing   Problem: Safety: Goal: Ability to remain free from injury will improve Outcome: Progressing   Problem: Activity: Goal: Risk for activity intolerance will decrease Outcome: Progressing   Problem: Pain Managment: Goal: General experience of comfort will improve Outcome: Progressing

## 2019-11-14 NOTE — Consult Note (Signed)
[facility] Consultation Note  Referring Provider: [facility] Primary Care Physician:  Lavone Orn, MD Primary Gastroenterologist:  Dr. Wilford Corner  Reason for Consultation:  dysphagia  HPI: Jasmine Jenkins is a 86 y.o. female admitted right hip fracture.  Repaired few days ago.  On xarelto, last dose yesterday.  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  Had few days improvement, but persistent dysphagia ever since, sensation of food stuck especially at night.  She has had worsening problems since her admission, can't tolerate food or liquids without vomiting mucous (doesn't vomit up the food).  No abdominal pain, blood in stool, change in bowel habits.   Past Medical History:  Diagnosis Date  . Arthritis   . Chronic back pain   . Chronic kidney disease   . Diabetes ([place])   . Diverticulosis   . GERD (gastroesophageal reflux disease)   . H/O hiatal hernia   . Hypertension   . Lower extremity edema   . Mild pulmonary hypertension ([place])   . Peripheral vascular disease ([place])   . Persistent atrial fibrillation ([place])   . PONV (postoperative nausea and vomiting)    when she is completely put under  . Renal artery stenosis ([place])   . Streptococcal sepsis [facility])     Past Surgical History:  Procedure Laterality Date  . BACK SURGERY    . BALLOON DILATION N/A 09/27/2017   Procedure: BALLOON DILATION;  Surgeon: Wilford Corner, MD;  Location: [facility] ENDOSCOPY;  Service: Endoscopy;  Laterality: N/A;  . CARDIAC CATHETERIZATION    . CARDIOVERSION  11/29/2011   Procedure: CARDIOVERSION;  Surgeon: Jettie Booze, MD;  Location: [place];  Service: Cardiovascular;  Laterality: N/A;  . CARDIOVERSION N/A 10/02/2012   Procedure: CARDIOVERSION;  Surgeon: Jettie Booze, MD;  Location: [place];  Service: Cardiovascular;  Laterality: N/A;  . ESOPHAGOGASTRODUODENOSCOPY (EGD)  WITH PROPOFOL N/A 08/24/2016   Procedure: ESOPHAGOGASTRODUODENOSCOPY (EGD) WITH PROPOFOL;  Surgeon: Teena Irani, MD;  Location: [place];  Service: Endoscopy;  Laterality: N/A;  . ESOPHAGOGASTRODUODENOSCOPY (EGD) WITH PROPOFOL N/A 09/27/2017   Procedure: ESOPHAGOGASTRODUODENOSCOPY (EGD) WITH PROPOFOL;  Surgeon: Wilford Corner, MD;  Location: [place];  Service: Endoscopy;  Laterality: N/A;  . HERNIA REPAIR    . INTRAMEDULLARY (IM) NAIL INTERTROCHANTERIC Right 11/11/2019   Procedure: INTRAMEDULLARY (IM) NAIL INTERTROCHANTRIC;  Surgeon: Erle Crocker, MD;  Location: [place];  Service: Orthopedics;  Laterality: Right;  . SAVORY DILATION N/A 08/24/2016   Procedure: SAVORY DILATION;  Surgeon: Teena Irani, MD;  Location: [facility] ENDOSCOPY;  Service: Endoscopy;  Laterality: N/A;  . SAVORY DILATION N/A 09/27/2017   Procedure: SAVORY DILATION;  Surgeon: Wilford Corner, MD;  Location: [place];  Service: Endoscopy;  Laterality: N/A;    Prior to Admission medications   Medication Sig Start Date End Date Taking? Authorizing Provider  glimepiride (AMARYL) 2 MG tablet Take 2 mg by mouth daily with breakfast.    Yes [provider]  losartan (COZAAR) 50 MG tablet Take 50 mg by mouth daily.   Yes [provider]  metoprolol succinate (TOPROL-XL) 50 MG 24 hr tablet Take 50 mg by mouth daily. Take with or immediately following a meal.   Yes [provider]  Multiple Vitamins-Minerals (ICAPS AREDS FORMULA PO) Take 1 capsule by mouth 2 (two) times daily.    Yes [provider]  Rivaroxaban (XARELTO) 20 MG TABS Take 1 tablet by mouth daily with  supper.    Yes [provider]  vitamin B-12 (CYANOCOBALAMIN) 1000 MCG tablet Take 1,000 mcg by mouth daily.   Yes [provider]    Current Facility-Administered Medications  Medication Dose Route Frequency Provider Last Rate Last Admin  . docusate sodium (COLACE) capsule 100 mg  100 mg Oral BID Terance Hart, MD   100 mg at 11/13/19 2136  . insulin aspart (novoLOG) injection 0-5 Units  0-5 Units Subcutaneous QHS Terance Hart, MD   2 Units at 11/11/19 2134  . insulin aspart (novoLOG) injection 0-9 Units  0-9 Units Subcutaneous TID WC Terance Hart, MD   1 Units at 11/14/19 0900  . losartan (COZAAR) tablet 50 mg  50 mg Oral Daily Terance Hart, MD   50 mg at 11/13/19 0934  . metoCLOPramide (REGLAN) tablet 5-10 mg  5-10 mg Oral Q8H PRN Terance Hart, MD       Or  . metoCLOPramide (REGLAN) injection 5-10 mg  5-10 mg Intravenous Q8H PRN Terance Hart, MD      . metoprolol succinate (TOPROL-XL) 24 hr tablet 50 mg  50 mg Oral Daily Terance Hart, MD   50 mg at 11/13/19 0934  . morphine 2 MG/ML injection 2 mg  2 mg Intravenous Q4H PRN Terance Hart, MD   2 mg at 11/14/19 0345  . ondansetron (ZOFRAN) injection 4 mg  4 mg Intravenous Q6H PRN Terance Hart, MD   4 mg at 11/11/19 0840  . ondansetron (ZOFRAN) tablet 4 mg  4 mg Oral Q6H PRN Terance Hart, MD   4 mg at 11/12/19 1553   Or  . ondansetron (ZOFRAN) injection 4 mg  4 mg Intravenous Q6H PRN Terance Hart, MD      . oxyCODONE (Oxy IR/ROXICODONE) immediate release tablet 5 mg  5 mg Oral Q6H PRN Terance Hart, MD   5 mg at 11/14/19 0725  . polyethylene glycol (MIRALAX / GLYCOLAX) packet 17 g  17 g Oral Daily Terance Hart, MD      . polyethylene glycol (MIRALAX / GLYCOLAX) packet 17 g  17 g Oral Once [place], Eric J, DO      . senna (SENOKOT) tablet 8.6 mg  1 tablet Oral BID Terance Hart, MD   8.6 mg at 11/13/19 2137  . vitamin B-12 (CYANOCOBALAMIN) tablet 1,000 mcg  1,000 mcg Oral Daily Terance Hart, MD   1,000 mcg at 11/12/19 4944    Allergies as of 11/10/2019 - Review Complete 11/10/2019  Allergen Reaction Noted  . Penicillins Anaphylaxis 05/13/2009  . Codeine  05/13/2009  . Hydrocodone-acetaminophen  05/13/2009    Family History   Problem Relation Age of Onset  . COPD Brother     Social History   Socioeconomic History  . Marital status: Married    Spouse name: Not on file  . Number of children: Not on file  . Years of education: Not on file  . Highest education level: Not on file  Occupational History  . Not on file  Tobacco Use  . Smoking status: Former Smoker    Quit date: 08/01/1957    Years since quitting: 62.3  . Smokeless tobacco: Never Used  Substance and Sexual Activity  . Alcohol use: No  . Drug use: No  . Sexual activity: Yes  Other Topics Concern  . 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   Married  Social Determinants of Health   Financial Resource Strain:   . Difficulty of Paying Living Expenses:   Food Insecurity:   . Worried About Programme researcher, broadcasting/film/video in the Last Year:   . Barista in the Last Year:   Transportation Needs:   . Freight forwarder (Medical):   Marland Kitchen Lack of Transportation (Non-Medical):   Physical Activity:   . Days of Exercise per Week:   . Minutes of Exercise per Session:   Stress:   . Feeling of Stress :   Social Connections:   . Frequency of Communication with Friends and Family:   . Frequency of Social Gatherings with Friends and Family:   . Attends Religious Services:   . Active Member of Clubs or Organizations:   . Attends Banker Meetings:   Marland Kitchen Marital Status:   Intimate Partner Violence:   . Fear of Current or Ex-Partner:   . Emotionally Abused:   Marland Kitchen Physically Abused:   . Sexually Abused:     Review of Systems: As per HPI, all others negative  Physical Exam: Vital signs in last 24 hours: Temp:  [98.1 F (36.7 C)-99.2 F (37.3 C)] 98.4 F (36.9 C) (04/16 0900) Pulse Rate:  [54-93] 54 (04/16 1025) Resp:  [15-18] 18 (04/16 0900) BP: (99-145)/(59-69) 133/67 (04/16 1025) SpO2:  [95 %-97 %] 96 % (04/16 0900) Last BM Date: 11/10/19 General:   Alert,  Well-developed, well-nourished, pleasant and  cooperative in NAD Head:  Normocephalic and atraumatic. Eyes:  Sclera clear, no icterus.   Conjunctiva pink. Ears:  Normal auditory acuity. Nose:  No deformity, discharge,  or lesions. Mouth:  No deformity or lesions.  Oropharynx pink & moist. Neck:  Supple; no masses or thyromegaly. Abdomen:  Soft, nontender and nondistended. No masses, hepatosplenomegaly or hernias noted. Normal bowel sounds, without guarding, and without rebound.     Msk:  Symmetrical without gross deformities. Normal posture. Pulses:  Normal pulses noted. Extremities:  Right hip fracture repair Without clubbing or edema. Neurologic:  Alert and  oriented x4;  grossly normal neurologically. Skin:  Scattered ecchymoses, otherwise Intact without significant lesions or rashes. Cervical Nodes:  No significant cervical adenopathy. Psych:  Alert and cooperative. Normal mood and affect.   Lab Results: Recent Labs    11/12/19 0227 11/13/19 0512  WBC 12.2* 14.5*  HGB 9.2* 8.8*  HCT 29.4* 28.1*  PLT 120* 118*   BMET Recent Labs    11/12/19 0227 11/13/19 0512  NA 134* 135  K 4.1 4.3  CL 98 99  CO2 27 28  GLUCOSE 275* 180*  BUN 25* 33*  CREATININE 1.05* 1.01*  CALCIUM 8.8* 8.8*   LFT No results for input(s): PROT, ALBUMIN, AST, ALT, ALKPHOS, BILITOT, BILIDIR, IBILI in the last 72 hours. PT/INR No results for input(s): LABPROT, INR in the last 72 hours.  Studies/Results: DG ESOPHAGUS W SINGLE CM (SOL OR THIN BA)  Result Date: 11/12/2019 CLINICAL DATA:  Dysphagia. EXAM: ESOPHOGRAM/BARIUM SWALLOW TECHNIQUE: Single contrast examination was performed using  thin barium. FLUOROSCOPY TIME:  Fluoroscopy Time:  2 minutes and 26 seconds Radiation Exposure Index (if provided by the fluoroscopic device): 36.4 mGy Number of Acquired Spot Images: 0 COMPARISON:  08/22/2017 FINDINGS: Severe diffuse esophageal spasm as demonstrated on prior study. Very tight distal esophageal stricture very similar to the prior study with  smooth strictured narrowing and very little passage of barium into the stomach. Persistent small hiatal hernia. No findings for laryngeal penetration or aspiration.  IMPRESSION: 1. Severe diffuse esophageal spasm with a very tight distal esophageal stricture very similar to the prior study. 2. Persistent small hiatal hernia. Electronically Signed   By: Rudie Meyer M.D.   On: 11/12/2019 13:16   Impression:  1.  Dysphagia.  Likely multifactorial (dysmotility and esophageal stricture).  Very minimal improvement after dilatation  2.  Anticoagulation, last dose Xarelto yesterday. 3.  Right hip fracture, repair earlier this week.  Plan:  1.  Endoscopy tomorrow with possible esophageal dilatation.  Discussed risks and benefits at length with patient and her family.  Also advised there is possibility that further esophageal dilatation might not help. 2.  Xarelto on hold. 3.  Risks (bleeding, infection, bowel perforation that could require surgery, sedation-related changes in cardiopulmonary systems), benefits (identification and possible treatment of source of symptoms, exclusion of certain causes of symptoms), and alternatives (watchful waiting, radiographic imaging studies, empiric medical treatment) of upper endoscopy (EGD) were explained to patient/family in detail and patient wishes to proceed.  LOS: 4 days   Xxavier Noon M  11/14/2019, 11:47 AM  Cell [phone] If no answer or after 5 PM call [phone]

## 2019-11-14 NOTE — Plan of Care (Signed)
  Problem: Activity: Goal: Ability to ambulate and perform ADLs will improve Outcome: Progressing   Problem: Pain Management: Goal: Pain level will decrease Outcome: Progressing   Problem: Activity: Goal: Risk for activity intolerance will decrease Outcome: Progressing   Problem: Safety: Goal: Ability to remain free from injury will improve Outcome: Progressing   Problem: Education: Goal: Knowledge of General Education information will improve Description: Including pain rating scale, medication(s)/side effects and non-pharmacologic comfort measures Outcome: Progressing   Problem: Health Behavior/Discharge Planning: Goal: Ability to manage health-related needs will improve Outcome: Progressing   Problem: Clinical Measurements: Goal: Ability to maintain clinical measurements within normal limits will improve Outcome: Progressing Goal: Will remain free from infection Outcome: Progressing Goal: Diagnostic test results will improve Outcome: Progressing Goal: Respiratory complications will improve Outcome: Progressing Goal: Cardiovascular complication will be avoided Outcome: Progressing   Problem: Activity: Goal: Risk for activity intolerance will decrease Outcome: Progressing   Problem: Nutrition: Goal: Adequate nutrition will be maintained Outcome: Progressing   Problem: Coping: Goal: Level of anxiety will decrease Outcome: Progressing   Problem: Elimination: Goal: Will not experience complications related to bowel motility Outcome: Progressing Goal: Will not experience complications related to urinary retention Outcome: Progressing   Problem: Pain Managment: Goal: General experience of comfort will improve Outcome: Progressing   Problem: Safety: Goal: Ability to remain free from injury will improve Outcome: Progressing   Problem: Skin Integrity: Goal: Risk for impaired skin integrity will decrease Outcome: Progressing

## 2019-11-14 NOTE — Progress Notes (Signed)
Patient co having difficulty swallowing and is NPO for possible studies today

## 2019-11-14 NOTE — Progress Notes (Signed)
PROGRESS NOTE    Jasmine Jenkins  MWU:[id number] DOB: [birth year] DOA: 11/10/2019 PCP: Kirby Funk, MD    Brief Narrative:  Jasmine Jenkins is a 86 y.o. female with medical history significant ofpermanent A. fib on Xarelto, hypertension, diabetes type 2 who presents to the emergency department for the evaluation of right hip pain. Patient is pretty independent and lives by herself. She was working in her yard and she was preparing to [employer] her lawn whenshe had a mechanical fall today, followed by immediate pain and decreased mobility of her right hip. She did not strike her head on the ground. She did not lose any consciousness. She was unable to ambulate after she fell. EMS arrived and she was given 100 mcg of fentanyl en route.  She follows with Dr. Johney Frame for her A. fib. She is on Xarelto for anticoagulation and takes metoprolol for rate control. Her son lives next door.   Assessment & Plan:   Principal Problem:   Closed intertrochanteric fracture of right hip (HCC) Active Problems:   HYPERTENSION, BENIGN   Atrial fibrillation (HCC)   Closed right hip fracture (HCC)   Hip fracture (HCC)   Right hip fracture Patient presenting to ED following mechanical fall at home.  X-ray notable for right intratrochanteric fracture with slight varus angulation.  Patient underwent ORIF with cephalomedullary nail by Dr. Susa Simmonds on 11/11/2019. --Currently holding home Xarelto for planned endoscopy as below tomorrow --PT recommends SNF placement, accepted [facility] --2-week follow-up orthopedics for x-rays right hip and wound check --Oxycodone q6h prn for moderate/severe pain, will try to avoid given her advanced age  Essential hypertension --Continue losartan 50 mg p.o. daily, metoprolol succinate 50 g p.o. daily  Permanent atrial fibrillation Continue metoprolol succinate 50 mg p.o. daily for rate control. --Holding home Xarelto as below  Type 2 diabetes mellitus On  glimepiride 2 mg p.o. daily at home. --Continue insulin sliding scale while inpatient --CBGs before every meal/at bedtime  Leukocytosis Unclear etiology, but high suspicion for reactive given fracture as above.  History of dysphagia with esophageal stricture and spasm Follows with [facility] gastroenterology outpatient, Dr. Bosie Clos.  Has undergone esophageal dilations in the past. Usually tolerates a regular diet in the a.m. but by the evening has more trouble and usually takes and only liquids such as Ensure.  Esophagram noted severe diffuse esophageal spasms with tight esophageal stricture.   --[facility] following, appreciate assistance --Plan EGD tomorrow morning --Continue n.p.o., NS at 75 mL's per hour --Continue to hold Xarelto, last dose 4/15 at 6pm  Constipation Now that she is n.p.o., will give bisacodyl suppository today  DVT prophylaxis: Xarelto, currently on hold for EGD 4/17 Code Status: Full Code Family Communication: Daughter and son present at bedside Disposition Plan:       Status is: Inpatient  Remains inpatient appropriate because:Ongoing active pain requiring inpatient pain management, Unsafe d/c plan and Inpatient level of care appropriate due to severity of illness pending EGD scheduled for tomorrow for dysphagia.   Dispo: The patient is from: Home              Anticipated d/c is to: SNF [facility]              Anticipated d/c date is: 2 days              Patient currently is not medically stable to d/c.    Consultants:   Orthopedics, Dr. Reginia Naas GI - Dr. Dulce Sellar  Procedures:  Cephalomedullary fixation of right intertrochanteric femur fracture on 11/11/2019 EGD: Pending for 11/15/2019  Antimicrobials:   Perioperative clindamycin   Subjective: Patient seen and examined bedside, resting comfortably.  Daughter and son present.  Has not been able to tolerate any type of food intake over the past 24 hours with associated nausea and vomiting.   Able to tolerate some liquids.  Esophagram noted with severe esophageal stricture with spasm.  Reconsulted GI today, plan EGD in the a.m. No other complaints or concerns at this time.  Denies headache, no visual changes, no chest pain, no palpitations, no shortness of breath, no abdominal pain.  No acute events overnight per nursing staff.  Objective: Vitals:   11/13/19 2027 11/14/19 0344 11/14/19 0900 11/14/19 1025  BP: 124/61 (!) 145/69 133/67 133/67  Pulse: (!) 54 93 (!) 54 (!) 54  Resp: 16 15 18    Temp: 98.5 F (36.9 C) 98.1 F (36.7 C) 98.4 F (36.9 C)   TempSrc: Oral Oral Oral   SpO2: 95% 97% 96%   Weight:      Height:        Intake/Output Summary (Last 24 hours) at 11/14/2019 1346 Last data filed at 11/13/2019 1900 Gross per 24 hour  Intake 240 ml  Output --  Net 240 ml   Filed Weights   11/10/19 1435 11/10/19 2200  Weight: 57.2 kg 57.8 kg    Examination:  General exam: Appears calm and comfortable  Respiratory system: Clear to auscultation. Respiratory effort normal.  Oxygenating well on room air. Cardiovascular system: S1 & S2 heard, RRR. No JVD, murmurs, rubs, gallops or clicks. No pedal edema. Gastrointestinal system: Abdomen is nondistended, soft and nontender. No organomegaly or masses felt. Normal bowel sounds heard. Central nervous system: Alert and oriented. No focal neurological deficits. Extremities: Symmetric 5 x 5 power. Skin: No rashes, lesions or ulcers, surgical dressing noted in place, clean/dry/intact Psychiatry: Judgement and insight appear normal. Mood & affect appropriate.     Data Reviewed: I have personally reviewed following labs and imaging studies  CBC: Recent Labs  Lab 11/10/19 1536 11/12/19 0227 11/13/19 0512  WBC 18.3* 12.2* 14.5*  NEUTROABS 16.4*  --   --   HGB 12.6 9.2* 8.8*  HCT 41.2 29.4* 28.1*  MCV 93.0 91.9 91.2  PLT 169 120* 175*   Basic Metabolic Panel: Recent Labs  Lab 11/10/19 1536 11/12/19 0227 11/13/19 0512   NA 133* 134* 135  K 4.9 4.1 4.3  CL 101 98 99  CO2 23 27 28   GLUCOSE 259* 275* 180*  BUN 19 25* 33*  CREATININE 0.63 1.05* 1.01*  CALCIUM 8.9 8.8* 8.8*   GFR: Estimated Creatinine Clearance: 32.6 mL/min (A) (by C-G formula based on SCr of 1.01 mg/dL (H)). Liver Function Tests: No results for input(s): AST, ALT, ALKPHOS, BILITOT, PROT, ALBUMIN in the last 168 hours. No results for input(s): LIPASE, AMYLASE in the last 168 hours. No results for input(s): AMMONIA in the last 168 hours. Coagulation Profile: Recent Labs  Lab 11/10/19 1536  INR 1.2   Cardiac Enzymes: No results for input(s): CKTOTAL, CKMB, CKMBINDEX, TROPONINI in the last 168 hours. BNP (last 3 results) No results for input(s): PROBNP in the last 8760 hours. HbA1C: Recent Labs    11/13/19 0512  HGBA1C 7.7*   CBG: Recent Labs  Lab 11/13/19 1135 11/13/19 1653 11/13/19 2110 11/14/19 0634 11/14/19 1118  GLUCAP 133* 164* 135* 148* 175*   Lipid Profile: No results for input(s): CHOL, HDL, LDLCALC,  TRIG, CHOLHDL, LDLDIRECT in the last 72 hours. Thyroid Function Tests: No results for input(s): TSH, T4TOTAL, FREET4, T3FREE, THYROIDAB in the last 72 hours. Anemia Panel: No results for input(s): VITAMINB12, FOLATE, FERRITIN, TIBC, IRON, RETICCTPCT in the last 72 hours. Sepsis Labs: No results for input(s): PROCALCITON, LATICACIDVEN in the last 168 hours.  Recent Results (from the past 240 hour(s))  Respiratory Panel by RT PCR (Flu A&B, Covid) - Nasopharyngeal Swab     Status: None   Collection Time: 11/10/19  3:47 PM   Specimen: Nasopharyngeal Swab  Result Value Ref Range Status   SARS Coronavirus 2 by RT PCR NEGATIVE NEGATIVE Final    Comment: (NOTE) SARS-CoV-2 target nucleic acids are NOT DETECTED. The SARS-CoV-2 RNA is generally detectable in upper respiratoy specimens during the acute phase of infection. The lowest concentration of SARS-CoV-2 viral copies this assay can detect is 131 copies/mL. A  negative result does not preclude SARS-Cov-2 infection and should not be used as the sole basis for treatment or other patient management decisions. A negative result may occur with  improper specimen collection/handling, submission of specimen other than nasopharyngeal swab, presence of viral mutation(s) within the areas targeted by this assay, and inadequate number of viral copies (<131 copies/mL). A negative result must be combined with clinical observations, patient history, and epidemiological information. The expected result is Negative. Fact Sheet for Patients:  https://www.moore.com/ Fact Sheet for Healthcare Providers:  https://www.young.biz/ This test is not yet ap proved or cleared by the [place] FDA and  has been authorized for detection and/or diagnosis of SARS-CoV-2 by FDA under an Emergency Use Authorization (EUA). This EUA will remain  in effect (meaning this test can be used) for the duration of the COVID-19 declaration under Section 564(b)(1) of the Act, 21 U.S.C. section 360bbb-3(b)(1), unless the authorization is terminated or revoked sooner.    Influenza A by PCR NEGATIVE NEGATIVE Final   Influenza B by PCR NEGATIVE NEGATIVE Final    Comment: (NOTE) The Xpert Xpress SARS-CoV-2/FLU/RSV assay is intended as an aid in  the diagnosis of influenza from Nasopharyngeal swab specimens and  should not be used as a sole basis for treatment. Nasal washings and  aspirates are unacceptable for Xpert Xpress SARS-CoV-2/FLU/RSV  testing. Fact Sheet for Patients: https://www.moore.com/ Fact Sheet for Healthcare Providers: https://www.young.biz/ This test is not yet approved or cleared by the [place] FDA and  has been authorized for detection and/or diagnosis of SARS-CoV-2 by  FDA under an Emergency Use Authorization (EUA). This EUA will remain  in effect (meaning this test can be used) for  the duration of the  Covid-19 declaration under Section 564(b)(1) of the Act, 21  U.S.C. section 360bbb-3(b)(1), unless the authorization is  terminated or revoked. Performed at [facility], [address], [place], [place] [address]   Surgical pcr screen     Status: None   Collection Time: 11/10/19  9:27 PM   Specimen: Nasal Mucosa; Nasal Swab  Result Value Ref Range Status   MRSA, PCR NEGATIVE NEGATIVE Final   Staphylococcus aureus NEGATIVE NEGATIVE Final    Comment: (NOTE) The Xpert SA Assay (FDA approved for NASAL specimens in patients 39 years of age and older), is one component of a comprehensive surveillance program. It is not intended to diagnose infection nor to guide or monitor treatment. Performed at [facility], [address] [place], [place] [address]   SARS CORONAVIRUS 2 (TAT 6-24 HRS) Nasopharyngeal Nasopharyngeal Swab  Status: None   Collection Time: 11/13/19  4:50 PM   Specimen: Nasopharyngeal Swab  Result Value Ref Range Status   SARS Coronavirus 2 NEGATIVE NEGATIVE Final    Comment: (NOTE) SARS-CoV-2 target nucleic acids are NOT DETECTED. The SARS-CoV-2 RNA is generally detectable in upper and lower respiratory specimens during the acute phase of infection. Negative results do not preclude SARS-CoV-2 infection, do not rule out co-infections with other pathogens, and should not be used as the sole basis for treatment or other patient management decisions. Negative results must be combined with clinical observations, patient history, and epidemiological information. The expected result is Negative. Fact Sheet for Patients: HairSlick.no Fact Sheet for Healthcare Providers: quierodirigir.com This test is not yet approved or cleared by the [place] FDA and  has been authorized for detection and/or diagnosis of SARS-CoV-2 by FDA under an Emergency Use Authorization (EUA).  This EUA will remain  in effect (meaning this test can be used) for the duration of the COVID-19 declaration under Section 56 4(b)(1) of the Act, 21 U.S.C. section 360bbb-3(b)(1), unless the authorization is terminated or revoked sooner. Performed at [facility], [address], [place], [place] [address]          Radiology Studies: No results found.      Scheduled Meds: . docusate sodium  100 mg Oral BID  . insulin aspart  0-5 Units Subcutaneous QHS  . insulin aspart  0-9 Units Subcutaneous TID WC  . losartan  50 mg Oral Daily  . metoprolol succinate  50 mg Oral Daily  . polyethylene glycol  17 g Oral Daily  . polyethylene glycol  17 g Oral Once  . senna  1 tablet Oral BID  . vitamin B-12  1,000 mcg Oral Daily   Continuous Infusions: . sodium chloride 75 mL/hr at 11/14/19 1232     LOS: 4 days    Time spent: 38 minutes spent on chart review, discussion with nursing staff, consultants, updating family and interview/physical exam; more than 50% of that time was spent in counseling and/or coordination of care.    Alvira Philips [place], DO [facility] Available via Epic secure chat 7am-7pm After these hours, please refer to coverage provider listed on [email or web address] 11/14/2019, 1:46 PM

## 2019-11-15 ENCOUNTER — Inpatient Hospital Stay (HOSPITAL_COMMUNITY): Payer: Medicare Other | Admitting: Registered Nurse

## 2019-11-15 ENCOUNTER — Encounter (HOSPITAL_COMMUNITY)
Admission: EM | Disposition: A | Discharge status: Skilled Nursing Facility | Payer: Self-pay | Source: Home / Self Care | Attending: Internal Medicine

## 2019-11-15 LAB — GLUCOSE, CAPILLARY
Glucose-Capillary: 147 mg/dL — ABNORMAL HIGH (ref 70–99)
Glucose-Capillary: 123 mg/dL — ABNORMAL HIGH (ref 70–99)
Glucose-Capillary: 175 mg/dL — ABNORMAL HIGH (ref 70–99)
Glucose-Capillary: 179 mg/dL — ABNORMAL HIGH (ref 70–99)

## 2019-11-15 SURGERY — CANCELLED PROCEDURE
Laterality: Left

## 2019-11-15 MED ORDER — ESMOLOL HCL 100 MG/10ML IV SOLN
INTRAVENOUS | Status: AC | PRN
  Administered 2019-11-15: 20 mg via INTRAVENOUS
  Administered 2019-11-15: 30 mg via INTRAVENOUS

## 2019-11-15 SURGICAL SUPPLY — 14 items

## 2019-11-15 NOTE — Anesthesia Preprocedure Evaluation (Signed)
Anesthesia Evaluation  Patient identified by MRN, date of birth, ID band Patient awake    Reviewed: Allergy & Precautions, NPO status , Patient's Chart, lab work & pertinent test results  History of Anesthesia Complications (+) PONV and history of anesthetic complications  Airway        Dental   Pulmonary former smoker,           Cardiovascular hypertension, + CAD and + Peripheral Vascular Disease  + dysrhythmias Atrial Fibrillation      Neuro/Psych negative neurological ROS  negative psych ROS   GI/Hepatic negative GI ROS, Neg liver ROS,   Endo/Other  diabetes  Renal/GU negative Renal ROS     Musculoskeletal negative musculoskeletal ROS (+)   Abdominal   Peds  Hematology negative hematology ROS (+)   Anesthesia Other Findings dysphagia, esophageal stricture  Reproductive/Obstetrics                             Anesthesia Physical Anesthesia Plan  ASA: IV  Anesthesia Plan: MAC   Post-op Pain Management:    Induction:   PONV Risk Score and Plan:   Airway Management Planned:   Additional Equipment:   Intra-op Plan:   Post-operative Plan:   Informed Consent:   Plan Discussed with:   Anesthesia Plan Comments:         Anesthesia Quick Evaluation

## 2019-11-15 NOTE — H&P (View-Only) (Signed)
[facility] Progress Note  Jasmine Jenkins 86 y.o. 09/27/1925   Subjective: Planned EGD with dilation cancelled due to Afib with RVR. Denies abdominal pain. Regurgitates liquids but denies regurgitating food.  Objective: Vital signs: Vitals:   11/15/19 0318 11/15/19 0953  BP: (!) 110/52 (!) 164/98  Pulse: (!) 55 (!) 123  Resp: 18 (!) 22  Temp: 97.8 F (36.6 Jenkins) 98.7 F (37.1 Jenkins)  SpO2: 96% 99%    Physical Exam: Gen: lethargic, elderly, thin, pleasant,  no acute distress  HEENT: anicteric sclera CV: RRR Chest: CTA B Abd: soft, nontender, nondistended, +BS Ext: no edema  Lab Results: Recent Labs    11/13/19 0512  NA 135  K 4.3  CL 99  CO2 28  GLUCOSE 180*  BUN 33*  CREATININE 1.01*  CALCIUM 8.8*   No results for input(s): AST, ALT, ALKPHOS, BILITOT, PROT, ALBUMIN in the last 72 hours. Recent Labs    11/13/19 0512  WBC 14.5*  HGB 8.8*  HCT 28.1*  MCV 91.2  PLT 118*      Assessment/Plan: Dysphagia with distal esophageal stricture. Suspect age-related dysmotility main source of her dysphagia. Due to Afib and RVR this morning, EGD with dilation was cancelled and Afib being medically managed. Will plan to reschedule EGD for 11/17/19 if her cardiac status is stable. Keep NPO due to recurrent regurgitation and risk of aspiration.   Jasmine Jenkins [place] Jasmine Jenkins 11/15/2019, 10:29 AM  Questions please call [phone] ID: Jasmine Jenkins, female   DOB: 02/17/1926, 86 y.o.   MRN: [id number]  

## 2019-11-15 NOTE — Progress Notes (Signed)
PROGRESS NOTE    Jasmine Jenkins  JIR:[id number] DOB: 14-Sep-1925 DOA: 11/10/2019 PCP: Lavone Orn, MD    Brief Narrative:  Jasmine Jenkins is a 86 y.o. female with medical history significant ofpermanent A. fib on Xarelto, hypertension, diabetes type 2 who presents to the emergency department for the evaluation of right hip pain. Patient is pretty independent and lives by herself. She was working in her yard and she was preparing to [employer] her lawn whenshe had a mechanical fall today, followed by immediate pain and decreased mobility of her right hip. She did not strike her head on the ground. She did not lose any consciousness. She was unable to ambulate after she fell. EMS arrived and she was given 100 mcg of fentanyl en route.  She follows with Dr. Rayann Heman for her A. fib. She is on Xarelto for anticoagulation and takes metoprolol for rate control. Her son lives next door.   Assessment & Plan:   Principal Problem:   Closed intertrochanteric fracture of right hip (HCC) Active Problems:   HYPERTENSION, BENIGN   Atrial fibrillation (HCC)   Closed right hip fracture (HCC)   Hip fracture (HCC)   Right hip fracture Patient presenting to ED following mechanical fall at home.  X-ray notable for right intratrochanteric fracture with slight varus angulation.  Patient underwent ORIF with cephalomedullary nail by Dr. Lucia Gaskins on 11/11/2019. --Currently holding home Xarelto for planned endoscopy; now planned for Monday --PT recommends SNF placement, accepted [facility] --2-week follow-up orthopedics for x-rays right hip and wound check --Oxycodone q6h prn for moderate/severe pain, will try to avoid given her advanced age  Essential hypertension --Continue losartan 50 mg p.o. daily, metoprolol succinate 50 g p.o. daily  Permanent atrial fibrillation with RVR Just prior to endoscopy this morning, anesthesia noted patient's heart rate elevated 120s-150s, and procedure aborted.   Patient did not receive her morning dose of metoprolol succinate this morning as she was n.p.o. --Restart telemetry --Continue metoprolol succinate 50 mg p.o. daily for rate control. --Continue to hold home Xarelto for planned repeat attempt at EGD on Monday  Type 2 diabetes mellitus On glimepiride 2 mg p.o. daily at home. --Continue insulin sliding scale while inpatient --CBGs before every meal/at bedtime  Leukocytosis Unclear etiology, but high suspicion for reactive given fracture as above.  History of dysphagia with esophageal stricture and spasm Follows with [facility] gastro[facility]ology outpatient, Dr. Michail Sermon.  Has undergone esophageal dilations in the past. Usually tolerates a regular diet in the a.m. but by the evening has more trouble and usually takes and only liquids such as Ensure.  Esophagram noted severe diffuse esophageal spasms with tight esophageal stricture.  While in endoscopy suite this morning, anesthesia noted A. fib with RVR and procedure was aborted. --[facility] following, appreciate assistance --Plan EGD Monday morning --Continue n.p.o., NS at 75 mL's per hour --Continue to hold Xarelto, last dose 4/15 at 6pm --Will attempt full liquid diet, aspiration precautions  Constipation 2 bowel movements reported overnight after PR bisacodyl. --Continue monitor bowel movements closely in the setting of narcotic use  DVT prophylaxis: Xarelto, currently on hold for EGD 4/17 Code Status: Full Code Family Communication: Daughter and son present at bedside Disposition Plan:       Status is: Inpatient  Remains inpatient appropriate because:Ongoing active pain requiring inpatient pain management, Unsafe d/c plan and Inpatient level of care appropriate due to severity of illness pending EGD scheduled for Monday for dysphagia.   Dispo: The patient is from: Home  Anticipated d/c is to: SNF [facility]              Anticipated d/c date is: 2 days               Patient currently is not medically stable to d/c.    Consultants:   Orthopedics, Dr. Reginia Naas GI - Dr. Dulce Sellar, Dr. Bosie Clos  Procedures:  Cephalomedullary fixation of right intertrochanteric femur fracture on 11/11/2019 EGD: Pending for 11/15/2019  Antimicrobials:   Perioperative clindamycin   Subjective: Patient seen and examined bedside, resting comfortably after returning from endoscopy this morning.  Procedure aborted due to A. fib with RVR.  Patient currently asymptomatic with no complaints.  Daughter and son present.  GI plans to reattempt endoscopy on Monday.  No other complaints or concerns at this time.  Denies headache, no visual changes, no chest pain, no palpitations, no shortness of breath, no abdominal pain.  No acute events overnight per nursing staff.  Objective: Vitals:   11/14/19 1913 11/15/19 0318 11/15/19 0953 11/15/19 1052  BP: (!) 136/92 (!) 110/52 (!) 164/98 (!) 147/72  Pulse: 88 (!) 55 (!) 123 (!) 102  Resp: 16 18 (!) 22 20  Temp: 98 F (36.7 C) 97.8 F (36.6 C) 98.7 F (37.1 C) 98 F (36.7 C)  TempSrc: Oral Oral Oral Oral  SpO2: 96% 96% 99% 97%  Weight:   58 kg   Height:        Intake/Output Summary (Last 24 hours) at 11/15/2019 1235 Last data filed at 11/15/2019 0400 Gross per 24 hour  Intake 1000 ml  Output 200 ml  Net 800 ml   Filed Weights   11/10/19 1435 11/10/19 2200 11/15/19 0953  Weight: 57.2 kg 57.8 kg 58 kg    Examination:  General exam: Appears calm and comfortable  Respiratory system: Clear to auscultation. Respiratory effort normal.  Oxygenating well on room air. Cardiovascular system: S1 & S2 heard, tachycardic, irregularly irregular rhythm. No JVD, murmurs, rubs, gallops or clicks. No pedal edema. Gastrointestinal system: Abdomen is nondistended, soft and nontender. No organomegaly or masses felt. Normal bowel sounds heard. Central nervous system: Alert and oriented. No focal neurological deficits. Extremities:  Symmetric 5 x 5 power. Skin: No rashes, lesions or ulcers, surgical dressing noted in place, clean/dry/intact Psychiatry: Judgement and insight appear normal. Mood & affect appropriate.     Data Reviewed: I have personally reviewed following labs and imaging studies  CBC: Recent Labs  Lab 11/10/19 1536 11/12/19 0227 11/13/19 0512  WBC 18.3* 12.2* 14.5*  NEUTROABS 16.4*  --   --   HGB 12.6 9.2* 8.8*  HCT 41.2 29.4* 28.1*  MCV 93.0 91.9 91.2  PLT 169 120* 118*   Basic Metabolic Panel: Recent Labs  Lab 11/10/19 1536 11/12/19 0227 11/13/19 0512  NA 133* 134* 135  K 4.9 4.1 4.3  CL 101 98 99  CO2 23 27 28   GLUCOSE 259* 275* 180*  BUN 19 25* 33*  CREATININE 0.63 1.05* 1.01*  CALCIUM 8.9 8.8* 8.8*   GFR: Estimated Creatinine Clearance: 32.6 mL/min (A) (by C-G formula based on SCr of 1.01 mg/dL (H)). Liver Function Tests: No results for input(s): AST, ALT, ALKPHOS, BILITOT, PROT, ALBUMIN in the last 168 hours. No results for input(s): LIPASE, AMYLASE in the last 168 hours. No results for input(s): AMMONIA in the last 168 hours. Coagulation Profile: Recent Labs  Lab 11/10/19 1536  INR 1.2   Cardiac Enzymes: No results for input(s): CKTOTAL, CKMB,  CKMBINDEX, TROPONINI in the last 168 hours. BNP (last 3 results) No results for input(s): PROBNP in the last 8760 hours. HbA1C: Recent Labs    11/13/19 0512  HGBA1C 7.7*   CBG: Recent Labs  Lab 11/14/19 1118 11/14/19 1605 11/14/19 2049 11/15/19 0650 11/15/19 1152  GLUCAP 175* 129* 143* 147* 123*   Lipid Profile: No results for input(s): CHOL, HDL, LDLCALC, TRIG, CHOLHDL, LDLDIRECT in the last 72 hours. Thyroid Function Tests: No results for input(s): TSH, T4TOTAL, FREET4, T3FREE, THYROIDAB in the last 72 hours. Anemia Panel: No results for input(s): VITAMINB12, FOLATE, FERRITIN, TIBC, IRON, RETICCTPCT in the last 72 hours. Sepsis Labs: No results for input(s): PROCALCITON, LATICACIDVEN in the last 168  hours.  Recent Results (from the past 240 hour(s))  Respiratory Panel by RT PCR (Flu A&B, Covid) - Nasopharyngeal Swab     Status: None   Collection Time: 11/10/19  3:47 PM   Specimen: Nasopharyngeal Swab  Result Value Ref Range Status   SARS Coronavirus 2 by RT PCR NEGATIVE NEGATIVE Final    Comment: (NOTE) SARS-CoV-2 target nucleic acids are NOT DETECTED. The SARS-CoV-2 RNA is generally detectable in upper respiratoy specimens during the acute phase of infection. The lowest concentration of SARS-CoV-2 viral copies this assay can detect is 131 copies/mL. A negative result does not preclude SARS-Cov-2 infection and should not be used as the sole basis for treatment or other patient management decisions. A negative result may occur with  improper specimen collection/handling, submission of specimen other than nasopharyngeal swab, presence of viral mutation(s) within the areas targeted by this assay, and inadequate number of viral copies (<131 copies/mL). A negative result must be combined with clinical observations, patient history, and epidemiological information. The expected result is Negative. Fact Sheet for Patients:  https://www.moore.com/ Fact Sheet for Healthcare Providers:  https://www.young.biz/ This test is not yet ap proved or cleared by the [place] FDA and  has been authorized for detection and/or diagnosis of SARS-CoV-2 by FDA under an Emergency Use Authorization (EUA). This EUA will remain  in effect (meaning this test can be used) for the duration of the COVID-19 declaration under Section 564(b)(1) of the Act, 21 U.S.C. section 360bbb-3(b)(1), unless the authorization is terminated or revoked sooner.    Influenza A by PCR NEGATIVE NEGATIVE Final   Influenza B by PCR NEGATIVE NEGATIVE Final    Comment: (NOTE) The Xpert Xpress SARS-CoV-2/FLU/RSV assay is intended as an aid in  the diagnosis of influenza from Nasopharyngeal  swab specimens and  should not be used as a sole basis for treatment. Nasal washings and  aspirates are unacceptable for Xpert Xpress SARS-CoV-2/FLU/RSV  testing. Fact Sheet for Patients: https://www.moore.com/ Fact Sheet for Healthcare Providers: https://www.young.biz/ This test is not yet approved or cleared by the [place] FDA and  has been authorized for detection and/or diagnosis of SARS-CoV-2 by  FDA under an Emergency Use Authorization (EUA). This EUA will remain  in effect (meaning this test can be used) for the duration of the  Covid-19 declaration under Section 564(b)(1) of the Act, 21  U.S.C. section 360bbb-3(b)(1), unless the authorization is  terminated or revoked. Performed at [facility], [address], [place], [place] [address]   Surgical pcr screen     Status: None   Collection Time: 11/10/19  9:27 PM   Specimen: Nasal Mucosa; Nasal Swab  Result Value Ref Range Status   MRSA, PCR NEGATIVE NEGATIVE Final   Staphylococcus aureus NEGATIVE NEGATIVE Final  Comment: (NOTE) The Xpert SA Assay (FDA approved for NASAL specimens in patients 17 years of age and older), is one component of a comprehensive surveillance program. It is not intended to diagnose infection nor to guide or monitor treatment. Performed at [facility], [address] [place], [place] [address]   SARS CORONAVIRUS 2 (TAT 6-24 HRS) Nasopharyngeal Nasopharyngeal Swab     Status: None   Collection Time: 11/13/19  4:50 PM   Specimen: Nasopharyngeal Swab  Result Value Ref Range Status   SARS Coronavirus 2 NEGATIVE NEGATIVE Final    Comment: (NOTE) SARS-CoV-2 target nucleic acids are NOT DETECTED. The SARS-CoV-2 RNA is generally detectable in upper and lower respiratory specimens during the acute phase of infection. Negative results do not preclude SARS-CoV-2 infection, do not rule out co-infections with other pathogens, and  should not be used as the sole basis for treatment or other patient management decisions. Negative results must be combined with clinical observations, patient history, and epidemiological information. The expected result is Negative. Fact Sheet for Patients: HairSlick.no Fact Sheet for Healthcare Providers: quierodirigir.com This test is not yet approved or cleared by the [place] FDA and  has been authorized for detection and/or diagnosis of SARS-CoV-2 by FDA under an Emergency Use Authorization (EUA). This EUA will remain  in effect (meaning this test can be used) for the duration of the COVID-19 declaration under Section 56 4(b)(1) of the Act, 21 U.S.C. section 360bbb-3(b)(1), unless the authorization is terminated or revoked sooner. Performed at [facility], [address], [place], [place] [address]          Radiology Studies: No results found.      Scheduled Meds: . docusate sodium  100 mg Oral BID  . insulin aspart  0-5 Units Subcutaneous QHS  . insulin aspart  0-9 Units Subcutaneous TID WC  . losartan  50 mg Oral Daily  . metoprolol succinate  50 mg Oral Daily  . polyethylene glycol  17 g Oral Daily  . polyethylene glycol  17 g Oral Once  . senna  1 tablet Oral BID  . vitamin B-12  1,000 mcg Oral Daily   Continuous Infusions:    LOS: 5 days    Time spent: 37 minutes spent on chart review, discussion with nursing staff, consultants, updating family and interview/physical exam; more than 50% of that time was spent in counseling and/or coordination of care.    Alvira Philips [place], DO [facility] Available via Epic secure chat 7am-7pm After these hours, please refer to coverage provider listed on [email or web address] 11/15/2019, 12:35 PM

## 2019-11-15 NOTE — Progress Notes (Signed)
MEWS Guidelines - (patients age 84 and over)  Pt yellow mews in GI, once back to floor mews back in green. RN spoke to CN and Dr.austria, see new orders. pt reports "feeling fine" and is asymptomatic. Will continue to monitor.

## 2019-11-15 NOTE — Progress Notes (Signed)
[facility] Progress Note  Jasmine Jenkins 86 y.o. May 12, 1926   Subjective: Planned EGD with dilation cancelled due to Afib with RVR. Denies abdominal pain. Regurgitates liquids but denies regurgitating food.  Objective: Vital signs: Vitals:   11/15/19 0318 11/15/19 0953  BP: (!) 110/52 (!) 164/98  Pulse: (!) 55 (!) 123  Resp: 18 (!) 22  Temp: 97.8 F (36.6 C) 98.7 F (37.1 C)  SpO2: 96% 99%    Physical Exam: Gen: lethargic, elderly, thin, pleasant,  no acute distress  HEENT: anicteric sclera CV: RRR Chest: CTA B Abd: soft, nontender, nondistended, +BS Ext: no edema  Lab Results: Recent Labs    11/13/19 0512  NA 135  K 4.3  CL 99  CO2 28  GLUCOSE 180*  BUN 33*  CREATININE 1.01*  CALCIUM 8.8*   No results for input(s): AST, ALT, ALKPHOS, BILITOT, PROT, ALBUMIN in the last 72 hours. Recent Labs    11/13/19 0512  WBC 14.5*  HGB 8.8*  HCT 28.1*  MCV 91.2  PLT 118*      Assessment/Plan: Dysphagia with distal esophageal stricture. Suspect age-related dysmotility main source of her dysphagia. Due to Afib and RVR this morning, EGD with dilation was cancelled and Afib being medically managed. Will plan to reschedule EGD for 11/17/19 if her cardiac status is stable. Keep NPO due to recurrent regurgitation and risk of aspiration.   Jasmine Jenkins 11/15/2019, 10:29 AM  Questions please call [phone] ID: Jasmine Jenkins, female   DOB: 1926/02/16, 86 y.o.   MRN: [id number]

## 2019-11-15 NOTE — Plan of Care (Signed)
  Problem: Activity: Goal: Ability to ambulate and perform ADLs will improve Outcome: Progressing   Problem: Pain Management: Goal: Pain level will decrease Outcome: Progressing   Problem: Health Behavior/Discharge Planning: Goal: Ability to manage health-related needs will improve Outcome: Progressing   Problem: Clinical Measurements: Goal: Ability to maintain clinical measurements within normal limits will improve Outcome: Progressing   Problem: Activity: Goal: Risk for activity intolerance will decrease Outcome: Progressing

## 2019-11-16 LAB — GLUCOSE, CAPILLARY
Glucose-Capillary: 150 mg/dL — ABNORMAL HIGH (ref 70–99)
Glucose-Capillary: 217 mg/dL — ABNORMAL HIGH (ref 70–99)
Glucose-Capillary: 146 mg/dL — ABNORMAL HIGH (ref 70–99)
Glucose-Capillary: 158 mg/dL — ABNORMAL HIGH (ref 70–99)

## 2019-11-16 LAB — BASIC METABOLIC PANEL
Anion gap: 7 (ref 5–15)
BUN: 15 mg/dL (ref 8–23)
CO2: 27 mmol/L (ref 22–32)
Calcium: 8.7 mg/dL — ABNORMAL LOW (ref 8.9–10.3)
Chloride: 105 mmol/L (ref 98–111)
Creatinine, Ser: 0.64 mg/dL (ref 0.44–1.00)
GFR calc Af Amer: >60 mL/min (ref >60)
GFR calc non Af Amer: >60 mL/min (ref >60)
Glucose, Bld: 156 mg/dL — ABNORMAL HIGH (ref 70–99)
Potassium: 4.4 mmol/L (ref 3.5–5.1)
Sodium: 139 mmol/L (ref 135–145)

## 2019-11-16 LAB — MAGNESIUM: Magnesium: 2 mg/dL (ref 1.7–2.4)

## 2019-11-16 MED ORDER — METOPROLOL SUCCINATE ER 50 MG PO TB24
75.0000 mg | ORAL_TABLET | Freq: Every day | ORAL | Status: DC
  Administered 2019-11-16 – 2019-11-18 (×3): 75 mg via ORAL
  Filled 2019-11-16 (×4): qty 1

## 2019-11-16 NOTE — Anesthesia Preprocedure Evaluation (Addendum)
Anesthesia Evaluation   Patient awake    Reviewed: Allergy & Precautions, NPO status , Patient's Chart, lab work & pertinent test results  History of Anesthesia Complications (+) PONV and history of anesthetic complications  Airway Mallampati: II  TM Distance: >3 FB Neck ROM: Full    Dental no notable dental hx.    Pulmonary former smoker,    Pulmonary exam normal breath sounds clear to auscultation       Cardiovascular hypertension, Pt. on medications and Pt. on home beta blockers + CAD and + Peripheral Vascular Disease  Normal cardiovascular exam+ dysrhythmias Atrial Fibrillation  Rhythm:Regular Rate:Normal  A-fib with RVR   Neuro/Psych negative neurological ROS  negative psych ROS   GI/Hepatic Neg liver ROS, hiatal hernia,   Endo/Other  diabetes, Oral Hypoglycemic Agents  Renal/GU negative Renal ROS     Musculoskeletal  (+) Arthritis ,   Abdominal   Peds  Hematology  (+) anemia ,   Anesthesia Other Findings dysphagia, esophageal stricture  Reproductive/Obstetrics                            Anesthesia Physical  Anesthesia Plan  ASA: IV  Anesthesia Plan: MAC   Post-op Pain Management:    Induction: Intravenous  PONV Risk Score and Plan: 3 and Propofol infusion and Treatment may vary due to age or medical condition  Airway Management Planned: Nasal Cannula  Additional Equipment:   Intra-op Plan:   Post-operative Plan:   Informed Consent: I have reviewed the patients History and Physical, chart, labs and discussed the procedure including the risks, benefits and alternatives for the proposed anesthesia with the patient or authorized representative who has indicated his/her understanding and acceptance.     Dental advisory given and Consent reviewed with POA  Plan Discussed with: CRNA  Anesthesia Plan Comments: (Use of medications as needed for heart rate control  discussed with patient, family, hospitalist, and gastroenterologist. )       Anesthesia Quick Evaluation

## 2019-11-16 NOTE — Plan of Care (Signed)
  Problem: Pain Managment: Goal: General experience of comfort will improve Outcome: Progressing   Problem: Safety: Goal: Ability to remain free from injury will improve Outcome: Progressing   Problem: Skin Integrity: Goal: Risk for impaired skin integrity will decrease Outcome: Progressing   

## 2019-11-16 NOTE — Plan of Care (Signed)
  Problem: Activity: Goal: Ability to ambulate and perform ADLs will improve Outcome: Progressing   Problem: Pain Management: Goal: Pain level will decrease Outcome: Progressing   Problem: Safety: Goal: Ability to remain free from injury will improve Outcome: Progressing   Problem: Education: Goal: Knowledge of General Education information will improve Description: Including pain rating scale, medication(s)/side effects and non-pharmacologic comfort measures Outcome: Progressing

## 2019-11-16 NOTE — Progress Notes (Signed)
PROGRESS NOTE    Jasmine Jenkins  WGN:[id number] DOB: [birth year] DOA: 11/10/2019 PCP: Lavone Orn, MD    Brief Narrative:  Jasmine Jenkins is a 86 y.o. female with medical history significant ofpermanent A. fib on Xarelto, hypertension, diabetes type 2 who presents to the emergency department for the evaluation of right hip pain. Patient is pretty independent and lives by herself. She was working in her yard and she was preparing to [employer] her lawn whenshe had a mechanical fall today, followed by immediate pain and decreased mobility of her right hip. She did not strike her head on the ground. She did not lose any consciousness. She was unable to ambulate after she fell. EMS arrived and she was given 100 mcg of fentanyl en route.  She follows with Dr. Rayann Heman for her A. fib. She is on Xarelto for anticoagulation and takes metoprolol for rate control. Her son lives next door.   Assessment & Plan:   Principal Problem:   Closed intertrochanteric fracture of right hip (HCC) Active Problems:   HYPERTENSION, BENIGN   Atrial fibrillation (HCC)   Closed right hip fracture (HCC)   Hip fracture (HCC)   Right hip fracture Patient presenting to ED following mechanical fall at home.  X-ray notable for right intratrochanteric fracture with slight varus angulation.  Patient underwent ORIF with cephalomedullary nail by Dr. Lucia Gaskins on 11/11/2019. --Currently holding home Xarelto for planned endoscopy; now planned for Monday --PT recommends SNF placement, accepted [facility] --2-week follow-up orthopedics for x-rays right hip and wound check --Oxycodone q6h prn for moderate/severe pain, will try to avoid given her advanced age  Essential hypertension --Continue losartan 50 mg p.o. daily --metoprolol succinate increased to 75 mg p.o. daily 4/18  Permanent atrial fibrillation with RVR Just prior to endoscopy 4/17, anesthesia noted patient's heart rate elevated 120s, and procedure  aborted.  Patient did not receive her morning dose of metoprolol succinate morning as she was n.p.o. --Metoprolol succinate increased to 75 mg p.o. daily today for rate control --Continue to monitor on telemetry --Continue to hold home Xarelto for planned repeat attempt at EGD on tomorrow  Type 2 diabetes mellitus On glimepiride 2 mg p.o. daily at home. --Continue insulin sliding scale while inpatient --CBGs before every meal/at bedtime  Leukocytosis Unclear etiology, but high suspicion for reactive given fracture as above.  History of dysphagia with esophageal stricture and spasm Follows with [facility] gastroenterology outpatient, Dr. Michail Sermon.  Has undergone esophageal dilations in the past. Usually tolerates a regular diet in the a.m. but by the evening has more trouble and usually takes and only liquids such as Ensure.  Esophagram noted severe diffuse esophageal spasms with tight esophageal stricture.  While in endoscopy suite this morning, anesthesia noted A. fib with RVR and procedure was aborted. --[facility] following, appreciate assistance --Plan EGD Monday morning --Continue n.p.o., NS at 75 mL's per hour --Continue to hold Xarelto, last dose 4/15 at 6pm --Will attempt full liquid diet, aspiration precautions; currently tolerating --N.p.o. after midnight except for medications  Constipation --Continue monitor bowel movements closely in the setting of narcotic use  DVT prophylaxis: Xarelto, currently on hold for EGD 4/19 Code Status: Full Code Family Communication: Daughter  at bedside Disposition Plan:       Status is: Inpatient  Remains inpatient appropriate because:Ongoing active pain requiring inpatient pain management, Unsafe d/c plan and Inpatient level of care appropriate due to severity of illness pending EGD scheduled for Monday for dysphagia.   Dispo: The  patient is from: Home              Anticipated d/c is to: SNF [facility]              Anticipated d/c  date is: 2 days              Patient currently is not medically stable to d/c.    Consultants:   Orthopedics, Dr. Reginia Naas GI - Dr. Dulce Sellar, Dr. Bosie Clos  Procedures:  Cephalomedullary fixation of right intertrochanteric femur fracture on 11/11/2019 EGD: Pending for 11/15/2019  Antimicrobials:   Perioperative clindamycin   Subjective: Patient seen and examined bedside, resting comfortably, daughter present.  Heart rate currently controlled, episode of A. fib with RVR nonsustained overnight with heart rate up to 160.  Asymptomatic.  Will increase metoprolol succinate to 75 mg p.o. daily.  Planning on repeat attempt at EGD tomorrow morning. No other complaints or concerns at this time.  Denies headache, no visual changes, no chest pain, no palpitations, no shortness of breath, no abdominal pain.  No acute events overnight per nursing staff.  Objective: Vitals:   11/15/19 1822 11/15/19 1950 11/16/19 0430 11/16/19 0749  BP: (!) 147/85 140/72 (!) 172/68 (!) 164/95  Pulse: 98 68 91 99  Resp: 18 18 16 18   Temp: 97.8 F (36.6 C) 98.2 F (36.8 C) 97.6 F (36.4 C) 97.6 F (36.4 C)  TempSrc: Oral Oral Oral   SpO2: 98% 96% 96% 98%  Weight:      Height:        Intake/Output Summary (Last 24 hours) at 11/16/2019 1155 Last data filed at 11/16/2019 1000 Gross per 24 hour  Intake 1210 ml  Output 502 ml  Net 708 ml   Filed Weights   11/10/19 1435 11/10/19 2200 11/15/19 0953  Weight: 57.2 kg 57.8 kg 58 kg    Examination:  General exam: Appears calm and comfortable  Respiratory system: Clear to auscultation. Respiratory effort normal.  Oxygenating well on room air. Cardiovascular system: S1 & S2 heard, irregularly irregular rhythm, normal rate. No JVD, murmurs, rubs, gallops or clicks. No pedal edema. Gastrointestinal system: Abdomen is nondistended, soft and nontender. No organomegaly or masses felt. Normal bowel sounds heard. Central nervous system: Alert and oriented. No focal  neurological deficits. Extremities: Symmetric 5 x 5 power. Skin: No rashes, lesions or ulcers, surgical dressing noted in place, clean/dry/intact Psychiatry: Judgement and insight appear normal. Mood & affect appropriate.     Data Reviewed: I have personally reviewed following labs and imaging studies  CBC: Recent Labs  Lab 11/10/19 1536 11/12/19 0227 11/13/19 0512  WBC 18.3* 12.2* 14.5*  NEUTROABS 16.4*  --   --   HGB 12.6 9.2* 8.8*  HCT 41.2 29.4* 28.1*  MCV 93.0 91.9 91.2  PLT 169 120* 118*   Basic Metabolic Panel: Recent Labs  Lab 11/10/19 1536 11/12/19 0227 11/13/19 0512 11/16/19 0304  NA 133* 134* 135 139  K 4.9 4.1 4.3 4.4  CL 101 98 99 105  CO2 23 27 28 27   GLUCOSE 259* 275* 180* 156*  BUN 19 25* 33* 15  CREATININE 0.63 1.05* 1.01* 0.64  CALCIUM 8.9 8.8* 8.8* 8.7*  MG  --   --   --  2.0   GFR: Estimated Creatinine Clearance: 41.2 mL/min (by C-G formula based on SCr of 0.64 mg/dL). Liver Function Tests: No results for input(s): AST, ALT, ALKPHOS, BILITOT, PROT, ALBUMIN in the last 168 hours. No results for input(s):  LIPASE, AMYLASE in the last 168 hours. No results for input(s): AMMONIA in the last 168 hours. Coagulation Profile: Recent Labs  Lab 11/10/19 1536  INR 1.2   Cardiac Enzymes: No results for input(s): CKTOTAL, CKMB, CKMBINDEX, TROPONINI in the last 168 hours. BNP (last 3 results) No results for input(s): PROBNP in the last 8760 hours. HbA1C: No results for input(s): HGBA1C in the last 72 hours. CBG: Recent Labs  Lab 11/15/19 0650 11/15/19 1152 11/15/19 1627 11/15/19 2021 11/16/19 0742  GLUCAP 147* 123* 175* 179* 150*   Lipid Profile: No results for input(s): CHOL, HDL, LDLCALC, TRIG, CHOLHDL, LDLDIRECT in the last 72 hours. Thyroid Function Tests: No results for input(s): TSH, T4TOTAL, FREET4, T3FREE, THYROIDAB in the last 72 hours. Anemia Panel: No results for input(s): VITAMINB12, FOLATE, FERRITIN, TIBC, IRON, RETICCTPCT in  the last 72 hours. Sepsis Labs: No results for input(s): PROCALCITON, LATICACIDVEN in the last 168 hours.  Recent Results (from the past 240 hour(s))  Respiratory Panel by RT PCR (Flu A&B, Covid) - Nasopharyngeal Swab     Status: None   Collection Time: 11/10/19  3:47 PM   Specimen: Nasopharyngeal Swab  Result Value Ref Range Status   SARS Coronavirus 2 by RT PCR NEGATIVE NEGATIVE Final    Comment: (NOTE) SARS-CoV-2 target nucleic acids are NOT DETECTED. The SARS-CoV-2 RNA is generally detectable in upper respiratoy specimens during the acute phase of infection. The lowest concentration of SARS-CoV-2 viral copies this assay can detect is 131 copies/mL. A negative result does not preclude SARS-Cov-2 infection and should not be used as the sole basis for treatment or other patient management decisions. A negative result may occur with  improper specimen collection/handling, submission of specimen other than nasopharyngeal swab, presence of viral mutation(s) within the areas targeted by this assay, and inadequate number of viral copies (<131 copies/mL). A negative result must be combined with clinical observations, patient history, and epidemiological information. The expected result is Negative. Fact Sheet for Patients:  https://www.moore.com/ Fact Sheet for Healthcare Providers:  https://www.young.biz/ This test is not yet ap proved or cleared by the [place] FDA and  has been authorized for detection and/or diagnosis of SARS-CoV-2 by FDA under an Emergency Use Authorization (EUA). This EUA will remain  in effect (meaning this test can be used) for the duration of the COVID-19 declaration under Section 564(b)(1) of the Act, 21 U.S.C. section 360bbb-3(b)(1), unless the authorization is terminated or revoked sooner.    Influenza A by PCR NEGATIVE NEGATIVE Final   Influenza B by PCR NEGATIVE NEGATIVE Final    Comment: (NOTE) The Xpert  Xpress SARS-CoV-2/FLU/RSV assay is intended as an aid in  the diagnosis of influenza from Nasopharyngeal swab specimens and  should not be used as a sole basis for treatment. Nasal washings and  aspirates are unacceptable for Xpert Xpress SARS-CoV-2/FLU/RSV  testing. Fact Sheet for Patients: https://www.moore.com/ Fact Sheet for Healthcare Providers: https://www.young.biz/ This test is not yet approved or cleared by the [place] FDA and  has been authorized for detection and/or diagnosis of SARS-CoV-2 by  FDA under an Emergency Use Authorization (EUA). This EUA will remain  in effect (meaning this test can be used) for the duration of the  Covid-19 declaration under Section 564(b)(1) of the Act, 21  U.S.C. section 360bbb-3(b)(1), unless the authorization is  terminated or revoked. Performed at [facility], [address], [place], [place] [address]   Surgical pcr screen     Status: None  Collection Time: 11/10/19  9:27 PM   Specimen: Nasal Mucosa; Nasal Swab  Result Value Ref Range Status   MRSA, PCR NEGATIVE NEGATIVE Final   Staphylococcus aureus NEGATIVE NEGATIVE Final    Comment: (NOTE) The Xpert SA Assay (FDA approved for NASAL specimens in patients 61 years of age and older), is one component of a comprehensive surveillance program. It is not intended to diagnose infection nor to guide or monitor treatment. Performed at [facility], [address], [place], [place] [address]   SARS CORONAVIRUS 2 (TAT 6-24 HRS) Nasopharyngeal Nasopharyngeal Swab     Status: None   Collection Time: 11/13/19  4:50 PM   Specimen: Nasopharyngeal Swab  Result Value Ref Range Status   SARS Coronavirus 2 NEGATIVE NEGATIVE Final    Comment: (NOTE) SARS-CoV-2 target nucleic acids are NOT DETECTED. The SARS-CoV-2 RNA is generally detectable in upper and lower respiratory specimens during the acute phase of infection.  Negative results do not preclude SARS-CoV-2 infection, do not rule out co-infections with other pathogens, and should not be used as the sole basis for treatment or other patient management decisions. Negative results must be combined with clinical observations, patient history, and epidemiological information. The expected result is Negative. Fact Sheet for Patients: HairSlick.no Fact Sheet for Healthcare Providers: quierodirigir.com This test is not yet approved or cleared by the [place] FDA and  has been authorized for detection and/or diagnosis of SARS-CoV-2 by FDA under an Emergency Use Authorization (EUA). This EUA will remain  in effect (meaning this test can be used) for the duration of the COVID-19 declaration under Section 56 4(b)(1) of the Act, 21 U.S.C. section 360bbb-3(b)(1), unless the authorization is terminated or revoked sooner. Performed at [facility], [address], [place], [place] [address]          Radiology Studies: No results found.      Scheduled Meds:  docusate sodium  100 mg Oral BID   insulin aspart  0-5 Units Subcutaneous QHS   insulin aspart  0-9 Units Subcutaneous TID WC   losartan  50 mg Oral Daily   metoprolol succinate  75 mg Oral Daily   polyethylene glycol  17 g Oral Daily   polyethylene glycol  17 g Oral Once   senna  1 tablet Oral BID   vitamin B-12  1,000 mcg Oral Daily   Continuous Infusions:    LOS: 6 days    Time spent: 37 minutes spent on chart review, discussion with nursing staff, consultants, updating family and interview/physical exam; more than 50% of that time was spent in counseling and/or coordination of care.    Alvira Philips [place], DO [facility] Available via Epic secure chat 7am-7pm After these hours, please refer to coverage provider listed on [email or web address] 11/16/2019, 11:55 AM

## 2019-11-17 ENCOUNTER — Inpatient Hospital Stay (HOSPITAL_COMMUNITY): Payer: Medicare Other | Admitting: Anesthesiology

## 2019-11-17 ENCOUNTER — Encounter (HOSPITAL_COMMUNITY)
Admission: EM | Disposition: A | Discharge status: Skilled Nursing Facility | Payer: Self-pay | Source: Home / Self Care | Attending: Internal Medicine

## 2019-11-17 ENCOUNTER — Encounter (HOSPITAL_COMMUNITY): Payer: Self-pay | Admitting: Internal Medicine

## 2019-11-17 HISTORY — PX: BALLOON DILATION: SHX5330

## 2019-11-17 HISTORY — PX: ESOPHAGOGASTRODUODENOSCOPY (EGD) WITH PROPOFOL: SHX5813

## 2019-11-17 LAB — GLUCOSE, CAPILLARY
Glucose-Capillary: 140 mg/dL — ABNORMAL HIGH (ref 70–99)
Glucose-Capillary: 101 mg/dL — ABNORMAL HIGH (ref 70–99)
Glucose-Capillary: 131 mg/dL — ABNORMAL HIGH (ref 70–99)
Glucose-Capillary: 132 mg/dL — ABNORMAL HIGH (ref 70–99)

## 2019-11-17 LAB — SARS CORONAVIRUS 2 (TAT 6-24 HRS): SARS Coronavirus 2: NEGATIVE

## 2019-11-17 SURGERY — ESOPHAGOGASTRODUODENOSCOPY (EGD) WITH PROPOFOL
Anesthesia: Monitor Anesthesia Care

## 2019-11-17 MED ORDER — LACTATED RINGERS IV SOLN
INTRAVENOUS | Status: AC | PRN
  Administered 2019-11-17: 1000 mL via INTRAVENOUS

## 2019-11-17 MED ORDER — ESMOLOL HCL 100 MG/10ML IV SOLN
INTRAVENOUS | Status: DC | PRN
  Administered 2019-11-17: 20 mg via INTRAVENOUS
  Administered 2019-11-17: 30 mg via INTRAVENOUS
  Administered 2019-11-17: 20 mg via INTRAVENOUS
  Administered 2019-11-17: 30 mg via INTRAVENOUS

## 2019-11-17 MED ORDER — METOPROLOL TARTRATE 5 MG/5ML IV SOLN
INTRAVENOUS | Status: DC | PRN
  Administered 2019-11-17: 1 mg via INTRAVENOUS

## 2019-11-17 MED ORDER — PROPOFOL 500 MG/50ML IV EMUL
INTRAVENOUS | Status: DC | PRN
  Administered 2019-11-17: 100 ug/kg/min via INTRAVENOUS

## 2019-11-17 MED ORDER — LACTATED RINGERS IV SOLN: INTRAVENOUS | Status: DC | PRN

## 2019-11-17 MED ORDER — PHENYLEPHRINE HCL (PRESSORS) 10 MG/ML IV SOLN
INTRAVENOUS | Status: DC | PRN
  Administered 2019-11-17 (×7): 80 ug via INTRAVENOUS

## 2019-11-17 SURGICAL SUPPLY — 15 items

## 2019-11-17 NOTE — Progress Notes (Signed)
Physical Therapy Treatment Patient Details Name: Jasmine Jenkins MRN: [id number] DOB: [birth year] Today's Date: 11/17/2019    History of Present Illness 86 yo female with onset of mechanical fall in her yard was admitted for IM nailing of R femoral intertrochanteric fracture.  PMHx:  OA, chronic back pain, DM, pulm HTN, PVD, LE edema, hiatal hernia, sepsis, renal artery stenosis,    PT Comments    Pt is up on side of bed to use bedside commode, then walked with PT sidestepping on side of bed.  Her plan is to progress with all mobility and work toward rehab to get home.   Pt lives alone and will need as much independence as possible with mobility.  Follow acutely for strengthening as tolerated, to work on longer gait trips and increase endurance for OOB and standing.   Follow Up Recommendations  SNF     Equipment Recommendations  Rolling walker with 5" wheels    Recommendations for Other Services       Precautions / Restrictions Precautions Precautions: Fall Precaution Comments: monitor pulses and sats Restrictions Weight Bearing Restrictions: Yes Other Position/Activity Restrictions: WBAT RLE    Mobility  Bed Mobility Overal bed mobility: Needs Assistance Bed Mobility: Supine to Sit;Sit to Supine     Supine to sit: Min assist Sit to supine: Min assist      Transfers Overall transfer level: Needs assistance Equipment used: Rolling walker (2 wheeled);1 person hand held assist Transfers: Sit to/from [employer] Sit to Stand: Min assist Stand pivot transfers: Min assist       General transfer comment: reminders given for use of hands on bed to control descent  Ambulation/Gait Ambulation/Gait assistance: Min assist Gait Distance (Feet): 9 Feet Assistive device: Rolling walker (2 wheeled);1 person hand held assist Gait Pattern/deviations: Step-to pattern;Shuffle;Decreased stride length Gait velocity: reduced Gait velocity interpretation: <1.31  ft/sec, indicative of household ambulator General Gait Details: clearing RLE off floor to take steps now   Stairs             Wheelchair Mobility    Modified Rankin (Stroke Patients Only)       Balance Overall balance assessment: History of Falls;Needs assistance Sitting-balance support: Feet supported;Bilateral upper extremity supported Sitting balance-Leahy Scale: Good Sitting balance - Comments: leaning forward and to R side with care   Standing balance support: Bilateral upper extremity supported;During functional activity Standing balance-Leahy Scale: Poor Standing balance comment: requires walker but can assist with moving for steps                            Cognition Arousal/Alertness: Awake/alert Behavior During Therapy: WFL for tasks assessed/performed Overall Cognitive Status: Within Functional Limits for tasks assessed                                 General Comments: Pleasant and joking with PT      Exercises General Exercises - Lower Extremity Ankle Circles/Pumps: Strengthening;10 reps Long Arc Quad: Strengthening;10 reps Heel Slides: Strengthening;10 reps    General Comments General comments (skin integrity, edema, etc.): pt needs assistance for all mobility but is decreasing and has better standing control      Pertinent Vitals/Pain Pain Assessment: 0-10 Pain Score: 6  Pain Location: right hip Pain Descriptors / Indicators: Operative site guarding;Grimacing;Guarding Pain Intervention(s): Limited activity within patient's tolerance;Monitored during session;Premedicated before session;Repositioned;Patient requesting pain meds-RN notified  Home Living                      Prior Function            PT Goals (current goals can now be found in the care plan section) Acute Rehab PT Goals Patient Stated Goal: get stronger Progress towards PT goals: Progressing toward goals    Frequency    Min  2X/week      PT Plan Current plan remains appropriate    Co-evaluation              AM-PAC PT "6 Clicks" Mobility   Outcome Measure  Help needed turning from your back to your side while in a flat bed without using bedrails?: A Little Help needed moving from lying on your back to sitting on the side of a flat bed without using bedrails?: A Little Help needed moving to and from a bed to a chair (including a wheelchair)?: A Little Help needed standing up from a chair using your arms (e.g., wheelchair or bedside chair)?: A Little Help needed to walk in hospital room?: A Little Help needed climbing 3-5 steps with a railing? : A Lot 6 Click Score: 17    End of Session Equipment Utilized During Treatment: Gait belt Activity Tolerance: Patient tolerated treatment well;Patient limited by pain Patient left: in bed;with call bell/phone within reach;with bed alarm set;with family/visitor present Nurse Communication: Mobility status PT Visit Diagnosis: Unsteadiness on feet (R26.81);Muscle weakness (generalized) (M62.81);Pain Pain - Right/Left: Right Pain - part of body: Hip     Time: 6659-9357 PT Time Calculation (min) (ACUTE ONLY): 34 min  Charges:  $Gait Training: 8-22 mins $Therapeutic Exercise: 8-22 mins              Ramond Dial 11/17/2019, 5:02 PM  Mee Hives, PT MS Acute Rehab Dept. Number: [place]

## 2019-11-17 NOTE — Plan of Care (Signed)
  Problem: Pain Managment: Goal: General experience of comfort will improve Outcome: Progressing   Problem: Safety: Goal: Ability to remain free from injury will improve Outcome: Progressing   Problem: Skin Integrity: Goal: Risk for impaired skin integrity will decrease Outcome: Progressing   

## 2019-11-17 NOTE — Anesthesia Postprocedure Evaluation (Signed)
Anesthesia Post Note  Patient: Jasmine Jenkins  Procedure(s) Performed: ESOPHAGOGASTRODUODENOSCOPY (EGD) WITH PROPOFOL (N/A ) BALLOON DILATION (N/A )     Patient location during evaluation: Endoscopy Anesthesia Type: MAC Level of consciousness: awake and alert Pain management: pain level controlled Vital Signs Assessment: post-procedure vital signs reviewed and stable Respiratory status: spontaneous breathing, nonlabored ventilation, respiratory function stable and patient connected to nasal cannula oxygen Cardiovascular status: stable and blood pressure returned to baseline Postop Assessment: no apparent nausea or vomiting Anesthetic complications: no    Last Vitals:  Vitals:   11/17/19 1119 11/17/19 1128  BP: (!) 115/47 127/62  Pulse: 89 95  Resp: (!) 23 (!) 24  Temp:    SpO2: 98% 98%    Last Pain:  Vitals:   11/17/19 1128  TempSrc:   PainSc: 0-No pain                 Cleona Doubleday P Mirielle Byrum

## 2019-11-17 NOTE — Progress Notes (Signed)
PROGRESS NOTE    Jasmine Jenkins  GNF:[id number] DOB: 1926-07-06 DOA: 11/10/2019 PCP: Kirby Funk, MD    Brief Narrative:  Jasmine Jenkins is a 86 y.o. female with medical history significant ofpermanent A. fib on Xarelto, hypertension, diabetes type 2 who presents to the emergency department for the evaluation of right hip pain. Patient is pretty independent and lives by herself. She was working in her yard and she was preparing to [employer] her lawn whenshe had a mechanical fall today, followed by immediate pain and decreased mobility of her right hip. She did not strike her head on the ground. She did not lose any consciousness. She was unable to ambulate after she fell. EMS arrived and she was given 100 mcg of fentanyl en route.  She follows with Dr. Johney Frame for her A. fib. She is on Xarelto for anticoagulation and takes metoprolol for rate control. Her son lives next door.   Assessment & Plan:   Principal Problem:   Closed intertrochanteric fracture of right hip (HCC) Active Problems:   HYPERTENSION, BENIGN   Atrial fibrillation (HCC)   Closed right hip fracture (HCC)   Hip fracture (HCC)   Right hip fracture Patient presenting to ED following mechanical fall at home.  X-ray notable for right intratrochanteric fracture with slight varus angulation.  Patient underwent ORIF with cephalomedullary nail by Dr. Susa Simmonds on 11/11/2019. --Currently holding home Xarelto for planned endoscopy; now planned for Monday --PT recommends SNF placement, accepted [facility] --2-week follow-up orthopedics for x-rays right hip and wound check --Oxycodone q6h prn for moderate/severe pain, will try to avoid given her advanced age  Essential hypertension --Continue losartan 50 mg p.o. daily --metoprolol succinate increased to 75 mg p.o. daily on 4/18  Permanent atrial fibrillation with RVR --Metoprolol succinate increased to 75 mg p.o. daily for rate control --Continue to monitor on  telemetry --Resume Xarelto today following EGD  Type 2 diabetes mellitus On glimepiride 2 mg p.o. daily at home. --Continue insulin sliding scale while inpatient --CBGs before every meal/at bedtime  Leukocytosis Unclear etiology, but high suspicion for reactive given fracture as above.  History of dysphagia with esophageal stricture and spasm Follows with [facility] gastroenterology outpatient, Dr. Bosie Clos.  Has undergone esophageal dilations in the past. Usually tolerates a regular diet in the a.m. but by the evening has more trouble and usually takes and only liquids such as Ensure.  Esophagram noted severe diffuse esophageal spasms with tight esophageal stricture.  Underwent EGD on 11/17/2019 by Dr. Bosie Clos with findings of esophageal stricture status post balloon dilation to 18 mm without any change in the stricture, suggestive that this is a functional obstruction due to age-related dysmotility. --Clear liquid diet, advance as tolerated to soft diet; unlikely will be able to tolerate solid food --Aspiration precautions  Constipation --Continue monitor bowel movements closely in the setting of narcotic use  DVT prophylaxis: Xarelto Code Status: Full Code Family Communication: Daughter and son at bedside Disposition Plan:       Status is: Inpatient  Remains inpatient appropriate because:Ongoing active pain requiring inpatient pain management, Unsafe d/c plan and Inpatient level of care appropriate due to severity of illness pending EGD scheduled for Monday for dysphagia.   Dispo: The patient is from: Home              Anticipated d/c is to: SNF [facility]              Anticipated d/c date is: 1 day  Patient currently is not medically stable to d/c.    Consultants:   Orthopedics, Dr. Reginia Naas GI - Dr. Dulce Sellar, Dr. Bosie Clos  Procedures:  Cephalomedullary fixation of right intertrochanteric femur fracture on 11/11/2019 EGD 11/17/2019 with esophageal  dilation  Antimicrobials:   Perioperative clindamycin   Subjective: Patient seen and examined bedside, resting comfortably, daughter and son present.  Patient underwent EGD with esophageal dilation, although no change in stricture following dilation and likely secondary to age-related dysmotility.  GI plans to start on clear liquid diet with advancement to soft, unlikely to tolerate solid foods due to her persistent stricture.  No other complaints or concerns at this time per patient or family. Denies headache, no visual changes, no chest pain, no palpitations, no shortness of breath, no abdominal pain.  No acute events overnight per nursing staff.  Objective: Vitals:   11/17/19 0842 11/17/19 0955 11/17/19 1119 11/17/19 1128  BP: (!) 155/73 140/63 (!) 115/47 127/62  Pulse: 98 60 89 95  Resp: (!) 24 19 (!) 23 (!) 24  Temp: 97.7 F (36.5 C)     TempSrc: Oral     SpO2: 100% 96% 98% 98%  Weight:      Height:       No intake or output data in the 24 hours ending 11/17/19 1410 Filed Weights   11/10/19 1435 11/10/19 2200 11/15/19 0953  Weight: 57.2 kg 57.8 kg 58 kg    Examination:  General exam: Appears calm and comfortable  Respiratory system: Clear to auscultation. Respiratory effort normal.  Oxygenating well on room air. Cardiovascular system: S1 & S2 heard, irregularly irregular rhythm, normal rate. No JVD, murmurs, rubs, gallops or clicks. No pedal edema. Gastrointestinal system: Abdomen is nondistended, soft and nontender. No organomegaly or masses felt. Normal bowel sounds heard. Central nervous system: Alert and oriented. No focal neurological deficits. Extremities: Symmetric 5 x 5 power. Skin: No rashes, lesions or ulcers, surgical dressing noted in place, clean/dry/intact Psychiatry: Judgement and insight appear normal. Mood & affect appropriate.     Data Reviewed: I have personally reviewed following labs and imaging studies  CBC: Recent Labs  Lab 11/10/19 1536  11/12/19 0227 11/13/19 0512  WBC 18.3* 12.2* 14.5*  NEUTROABS 16.4*  --   --   HGB 12.6 9.2* 8.8*  HCT 41.2 29.4* 28.1*  MCV 93.0 91.9 91.2  PLT 169 120* 118*   Basic Metabolic Panel: Recent Labs  Lab 11/10/19 1536 11/12/19 0227 11/13/19 0512 11/16/19 0304  NA 133* 134* 135 139  K 4.9 4.1 4.3 4.4  CL 101 98 99 105  CO2 23 27 28 27   GLUCOSE 259* 275* 180* 156*  BUN 19 25* 33* 15  CREATININE 0.63 1.05* 1.01* 0.64  CALCIUM 8.9 8.8* 8.8* 8.7*  MG  --   --   --  2.0   GFR: Estimated Creatinine Clearance: 41.2 mL/min (by C-G formula based on SCr of 0.64 mg/dL). Liver Function Tests: No results for input(s): AST, ALT, ALKPHOS, BILITOT, PROT, ALBUMIN in the last 168 hours. No results for input(s): LIPASE, AMYLASE in the last 168 hours. No results for input(s): AMMONIA in the last 168 hours. Coagulation Profile: Recent Labs  Lab 11/10/19 1536  INR 1.2   Cardiac Enzymes: No results for input(s): CKTOTAL, CKMB, CKMBINDEX, TROPONINI in the last 168 hours. BNP (last 3 results) No results for input(s): PROBNP in the last 8760 hours. HbA1C: No results for input(s): HGBA1C in the last 72 hours. CBG: Recent Labs  Lab  11/16/19 1229 11/16/19 1622 11/16/19 2045 11/17/19 0849 11/17/19 1213  GLUCAP 217* 146* 158* 140* 101*   Lipid Profile: No results for input(s): CHOL, HDL, LDLCALC, TRIG, CHOLHDL, LDLDIRECT in the last 72 hours. Thyroid Function Tests: No results for input(s): TSH, T4TOTAL, FREET4, T3FREE, THYROIDAB in the last 72 hours. Anemia Panel: No results for input(s): VITAMINB12, FOLATE, FERRITIN, TIBC, IRON, RETICCTPCT in the last 72 hours. Sepsis Labs: No results for input(s): PROCALCITON, LATICACIDVEN in the last 168 hours.  Recent Results (from the past 240 hour(s))  Respiratory Panel by RT PCR (Flu A&B, Covid) - Nasopharyngeal Swab     Status: None   Collection Time: 11/10/19  3:47 PM   Specimen: Nasopharyngeal Swab  Result Value Ref Range Status   SARS  Coronavirus 2 by RT PCR NEGATIVE NEGATIVE Final    Comment: (NOTE) SARS-CoV-2 target nucleic acids are NOT DETECTED. The SARS-CoV-2 RNA is generally detectable in upper respiratoy specimens during the acute phase of infection. The lowest concentration of SARS-CoV-2 viral copies this assay can detect is 131 copies/mL. A negative result does not preclude SARS-Cov-2 infection and should not be used as the sole basis for treatment or other patient management decisions. A negative result may occur with  improper specimen collection/handling, submission of specimen other than nasopharyngeal swab, presence of viral mutation(s) within the areas targeted by this assay, and inadequate number of viral copies (<131 copies/mL). A negative result must be combined with clinical observations, patient history, and epidemiological information. The expected result is Negative. Fact Sheet for Patients:  https://www.moore.com/ Fact Sheet for Healthcare Providers:  https://www.young.biz/ This test is not yet ap proved or cleared by the [place] FDA and  has been authorized for detection and/or diagnosis of SARS-CoV-2 by FDA under an Emergency Use Authorization (EUA). This EUA will remain  in effect (meaning this test can be used) for the duration of the COVID-19 declaration under Section 564(b)(1) of the Act, 21 U.S.C. section 360bbb-3(b)(1), unless the authorization is terminated or revoked sooner.    Influenza A by PCR NEGATIVE NEGATIVE Final   Influenza B by PCR NEGATIVE NEGATIVE Final    Comment: (NOTE) The Xpert Xpress SARS-CoV-2/FLU/RSV assay is intended as an aid in  the diagnosis of influenza from Nasopharyngeal swab specimens and  should not be used as a sole basis for treatment. Nasal washings and  aspirates are unacceptable for Xpert Xpress SARS-CoV-2/FLU/RSV  testing. Fact Sheet for Patients: https://www.moore.com/ Fact Sheet  for Healthcare Providers: https://www.young.biz/ This test is not yet approved or cleared by the [place] FDA and  has been authorized for detection and/or diagnosis of SARS-CoV-2 by  FDA under an Emergency Use Authorization (EUA). This EUA will remain  in effect (meaning this test can be used) for the duration of the  Covid-19 declaration under Section 564(b)(1) of the Act, 21  U.S.C. section 360bbb-3(b)(1), unless the authorization is  terminated or revoked. Performed at [facility], [address], [place], [place] [address]   Surgical pcr screen     Status: None   Collection Time: 11/10/19  9:27 PM   Specimen: Nasal Mucosa; Nasal Swab  Result Value Ref Range Status   MRSA, PCR NEGATIVE NEGATIVE Final   Staphylococcus aureus NEGATIVE NEGATIVE Final    Comment: (NOTE) The Xpert SA Assay (FDA approved for NASAL specimens in patients 68 years of age and older), is one component of a comprehensive surveillance program. It is not intended to diagnose infection nor to guide or monitor  treatment. Performed at [facility], [address], [place], [place] [address]   SARS CORONAVIRUS 2 (TAT 6-24 HRS) Nasopharyngeal Nasopharyngeal Swab     Status: None   Collection Time: 11/13/19  4:50 PM   Specimen: Nasopharyngeal Swab  Result Value Ref Range Status   SARS Coronavirus 2 NEGATIVE NEGATIVE Final    Comment: (NOTE) SARS-CoV-2 target nucleic acids are NOT DETECTED. The SARS-CoV-2 RNA is generally detectable in upper and lower respiratory specimens during the acute phase of infection. Negative results do not preclude SARS-CoV-2 infection, do not rule out co-infections with other pathogens, and should not be used as the sole basis for treatment or other patient management decisions. Negative results must be combined with clinical observations, patient history, and epidemiological information. The expected result is Negative. Fact Sheet  for Patients: HairSlick.no Fact Sheet for Healthcare Providers: quierodirigir.com This test is not yet approved or cleared by the [place] FDA and  has been authorized for detection and/or diagnosis of SARS-CoV-2 by FDA under an Emergency Use Authorization (EUA). This EUA will remain  in effect (meaning this test can be used) for the duration of the COVID-19 declaration under Section 56 4(b)(1) of the Act, 21 U.S.C. section 360bbb-3(b)(1), unless the authorization is terminated or revoked sooner. Performed at [facility], [address] [place], [place] [address]          Radiology Studies: No results found.      Scheduled Meds: . docusate sodium  100 mg Oral BID  . insulin aspart  0-5 Units Subcutaneous QHS  . insulin aspart  0-9 Units Subcutaneous TID WC  . losartan  50 mg Oral Daily  . metoprolol succinate  75 mg Oral Daily  . polyethylene glycol  17 g Oral Daily  . polyethylene glycol  17 g Oral Once  . senna  1 tablet Oral BID  . vitamin B-12  1,000 mcg Oral Daily   Continuous Infusions:    LOS: 7 days    Time spent: 35 minutes spent on chart review, discussion with nursing staff, consultants, updating family and interview/physical exam; more than 50% of that time was spent in counseling and/or coordination of care.    Alvira Philips [place], DO [facility] Available via Epic secure chat 7am-7pm After these hours, please refer to coverage provider listed on [email or web address] 11/17/2019, 2:10 PM

## 2019-11-17 NOTE — Brief Op Note (Signed)
Distal esophageal stricture that allowed passage of the standard endoscope although mild resistance occurred. Balloon dilation to 18 mm without any change in the stricture following dilation suggesting that this a functional obstruction due to age-related dysmotility. Liquid diet and advance as tolerated to soft diet but I do not think she will be able to tolerate solid food. Aspiration precautions. No repeat dilation recommended. See endopro for details. Discussed with family at bedside.

## 2019-11-17 NOTE — Plan of Care (Signed)
  Problem: Activity: Goal: Ability to ambulate and perform ADLs will improve Outcome: Progressing   Problem: Pain Management: Goal: Pain level will decrease Outcome: Progressing   Problem: Activity: Goal: Risk for activity intolerance will decrease Outcome: Progressing   Problem: Safety: Goal: Ability to remain free from injury will improve Outcome: Progressing   Problem: Education: Goal: Knowledge of General Education information will improve Description: Including pain rating scale, medication(s)/side effects and non-pharmacologic comfort measures Outcome: Progressing   Problem: Clinical Measurements: Goal: Will remain free from infection Outcome: Progressing   Problem: Activity: Goal: Risk for activity intolerance will decrease Outcome: Progressing   Problem: Nutrition: Goal: Adequate nutrition will be maintained Outcome: Progressing   Problem: Pain Managment: Goal: General experience of comfort will improve Outcome: Progressing   Problem: Safety: Goal: Ability to remain free from injury will improve Outcome: Progressing   Problem: Skin Integrity: Goal: Risk for impaired skin integrity will decrease Outcome: Progressing

## 2019-11-17 NOTE — Interval H&P Note (Signed)
History and Physical Interval Note:  11/17/2019 10:08 AM  Jasmine Jenkins  has presented today for surgery, with the diagnosis of dysphagia, esophageal stricture.  The various methods of treatment have been discussed with the patient and family. After consideration of risks, benefits and other options for treatment, the patient has consented to  Procedure(s): ESOPHAGOGASTRODUODENOSCOPY (EGD) WITH PROPOFOL (N/A) BALLOON DILATION (N/A) as a surgical intervention.  The patient's history has been reviewed, patient examined, no change in status, stable for surgery.  I have reviewed the patient's chart and labs.  Questions were answered to the patient's satisfaction.     Shirley Friar

## 2019-11-17 NOTE — Transfer of Care (Signed)
Immediate Anesthesia Transfer of Care Note  Patient: Jasmine Jenkins  Procedure(s) Performed: ESOPHAGOGASTRODUODENOSCOPY (EGD) WITH PROPOFOL (N/A ) BALLOON DILATION (N/A )  Patient Location: Endoscopy Unit  Anesthesia Type:MAC  Level of Consciousness: awake, drowsy and patient cooperative  Airway & Oxygen Therapy: Patient Spontanous Breathing and Patient connected to nasal cannula oxygen  Post-op Assessment: Report given to RN, Post -op Vital signs reviewed and stable and Patient moving all extremities X 4  Post vital signs: Reviewed and stable  Last Vitals:  Vitals Value Taken Time  BP 115/47 11/17/19 1119  Temp    Pulse 83 11/17/19 1119  Resp 23 11/17/19 1119  SpO2 99 % 11/17/19 1119  Vitals shown include unvalidated device data.  Last Pain:  Vitals:   11/17/19 0955  TempSrc:   PainSc: 2       Patients Stated Pain Goal: 2 (70/62/37 [id number])  Complications: No apparent anesthesia complications

## 2019-11-17 NOTE — Op Note (Signed)
[facility] Patient Name: Jasmine Jenkins Procedure Date : 11/17/2019 MRN: [id number] Attending MD: Lear Ng , MD Date of Birth: [birth year] CSN: [id number] Age: 86 Admit Type: Inpatient Procedure:                Upper GI endoscopy Indications:              Dysphagia, Stricture of the esophagus Providers:                Lear Ng, MD, Corie Chiquito, Technician,                            Phill Myron. Proofreader, CRNA, Angus Seller Referring MD:             hospital team Medicines:                Propofol per Anesthesia, Monitored Anesthesia Care Complications:            No immediate complications. Estimated Blood Loss:     Estimated blood loss was minimal. Procedure:                Pre-Anesthesia Assessment:                           - Prior to the procedure, a History and Physical                            was performed, and patient medications and                            allergies were reviewed. The patient's tolerance of                            previous anesthesia was also reviewed. The risks                            and benefits of the procedure and the sedation                            options and risks were discussed with the patient.                            All questions were answered, and informed consent                            was obtained. Prior Anticoagulants: The patient has                            taken no previous anticoagulant or antiplatelet                            agents. ASA Grade Assessment: IV - A patient with                            severe systemic disease that is a constant threat  to life. After reviewing the risks and benefits,                            the patient was deemed in satisfactory condition to                            undergo the procedure.                           After obtaining informed consent, the endoscope was                            passed under direct  vision. Throughout the                            procedure, the patient's blood pressure, pulse, and                            oxygen saturations were monitored continuously. The                            GIF-H190 ([id number]) Olympus gastroscope was                            introduced through the mouth, and advanced to the                            second part of duodenum. The Endoscope was                            introduced through the mouth, and advanced to the                            second part of duodenum. The upper GI endoscopy was                            performed with difficulty due to stricture.                            Successful completion of the procedure was aided by                            performing the maneuvers documented (below) in this                            report. The patient tolerated the procedure well. Scope In: Scope Out: Findings:      One benign-appearing, intrinsic moderate (circumferential scarring or       stenosis; an endoscope may pass) stenosis was found in the distal       esophagus. The stenosis was traversed. A TTS dilator was passed through       the scope. Dilation with a 15-16.5-18 mm balloon dilator was performed       to 18 mm. The dilation site was examined and showed no  change. Estimated       blood loss was minimal.      Mild resistance on passage of endoscope into the stomach. White       desquamation tissue scattered in mid-esophagus. No heme on balloon       dilation.      The Z-line was found 36 cm from the incisors.      Diffuse minimal inflammation characterized by congestion (edema) and       erythema was found in the gastric body.      A medium-sized hiatal hernia was present.      The examined duodenum was normal. Impression:               - Benign-appearing esophageal stenosis. Dilated.                           - Z-line, 36 cm from the incisors.                           - Gastritis.                            - Medium-sized hiatal hernia.                           - Normal examined duodenum.                           - No specimens collected. Recommendation:           - Clear liquid diet. Procedure Code(s):        --- Professional ---                           [id number], Esophagogastroduodenoscopy, flexible,                            transoral; with transendoscopic balloon dilation of                            esophagus (less than 30 mm diameter) Diagnosis Code(s):        --- Professional ---                           R13.10, Dysphagia, unspecified                           K22.2, Esophageal obstruction                           K29.70, Gastritis, unspecified, without bleeding                           K44.9, Diaphragmatic hernia without obstruction or                            gangrene CPT copyright 2019 American [employer] Association. All rights reserved. The codes documented in this report are preliminary and upon coder review may  be revised to meet current compliance requirements. Shirley Friar, MD 11/17/2019  11:20:41 AM This report has been signed electronically. Number of Addenda: 0

## 2019-11-18 DIAGNOSIS — R1310 Dysphagia, unspecified: Secondary | ICD-10-CM | POA: Diagnosis not present

## 2019-11-18 DIAGNOSIS — R5381 Other malaise: Secondary | ICD-10-CM | POA: Diagnosis not present

## 2019-11-18 DIAGNOSIS — S72141D Displaced intertrochanteric fracture of right femur, subsequent encounter for closed fracture with routine healing: Secondary | ICD-10-CM | POA: Diagnosis not present

## 2019-11-18 DIAGNOSIS — Z4789 Encounter for other orthopedic aftercare: Secondary | ICD-10-CM | POA: Diagnosis not present

## 2019-11-18 DIAGNOSIS — K5900 Constipation, unspecified: Secondary | ICD-10-CM | POA: Diagnosis not present

## 2019-11-18 DIAGNOSIS — R0602 Shortness of breath: Secondary | ICD-10-CM | POA: Diagnosis not present

## 2019-11-18 DIAGNOSIS — I4821 Permanent atrial fibrillation: Secondary | ICD-10-CM | POA: Diagnosis not present

## 2019-11-18 DIAGNOSIS — D649 Anemia, unspecified: Secondary | ICD-10-CM | POA: Diagnosis not present

## 2019-11-18 DIAGNOSIS — I10 Essential (primary) hypertension: Secondary | ICD-10-CM | POA: Diagnosis not present

## 2019-11-18 DIAGNOSIS — I2510 Atherosclerotic heart disease of native coronary artery without angina pectoris: Secondary | ICD-10-CM | POA: Diagnosis not present

## 2019-11-18 DIAGNOSIS — M25551 Pain in right hip: Secondary | ICD-10-CM | POA: Diagnosis not present

## 2019-11-18 DIAGNOSIS — Z7901 Long term (current) use of anticoagulants: Secondary | ICD-10-CM | POA: Diagnosis not present

## 2019-11-18 DIAGNOSIS — S72144A Nondisplaced intertrochanteric fracture of right femur, initial encounter for closed fracture: Secondary | ICD-10-CM | POA: Diagnosis not present

## 2019-11-18 DIAGNOSIS — S72141A Displaced intertrochanteric fracture of right femur, initial encounter for closed fracture: Secondary | ICD-10-CM | POA: Diagnosis not present

## 2019-11-18 DIAGNOSIS — K222 Esophageal obstruction: Secondary | ICD-10-CM | POA: Diagnosis not present

## 2019-11-18 DIAGNOSIS — D6869 Other thrombophilia: Secondary | ICD-10-CM | POA: Diagnosis not present

## 2019-11-18 DIAGNOSIS — I4891 Unspecified atrial fibrillation: Secondary | ICD-10-CM | POA: Diagnosis not present

## 2019-11-18 DIAGNOSIS — S72001A Fracture of unspecified part of neck of right femur, initial encounter for closed fracture: Secondary | ICD-10-CM | POA: Diagnosis not present

## 2019-11-18 DIAGNOSIS — E119 Type 2 diabetes mellitus without complications: Secondary | ICD-10-CM | POA: Diagnosis not present

## 2019-11-18 DIAGNOSIS — Z7401 Bed confinement status: Secondary | ICD-10-CM | POA: Diagnosis not present

## 2019-11-18 DIAGNOSIS — I350 Nonrheumatic aortic (valve) stenosis: Secondary | ICD-10-CM | POA: Diagnosis not present

## 2019-11-18 DIAGNOSIS — M2550 Pain in unspecified joint: Secondary | ICD-10-CM | POA: Diagnosis not present

## 2019-11-18 DIAGNOSIS — E559 Vitamin D deficiency, unspecified: Secondary | ICD-10-CM | POA: Diagnosis not present

## 2019-11-18 LAB — GLUCOSE, CAPILLARY
Glucose-Capillary: 128 mg/dL — ABNORMAL HIGH (ref 70–99)
Glucose-Capillary: 139 mg/dL — ABNORMAL HIGH (ref 70–99)
Glucose-Capillary: 166 mg/dL — ABNORMAL HIGH (ref 70–99)

## 2019-11-18 MED ORDER — POLYETHYLENE GLYCOL 3350 17 G PO PACK: 17.0000 g | PACK | Freq: Every day | ORAL | 0 refills | Status: DC

## 2019-11-18 MED ORDER — METOPROLOL SUCCINATE ER 25 MG PO TB24: 75.0000 mg | ORAL_TABLET | Freq: Every day | ORAL | 0 refills | Status: DC

## 2019-11-18 MED ORDER — MAGNESIUM CITRATE PO SOLN
1.0000 | Freq: Once | ORAL | Status: AC
  Administered 2019-11-18: 09:00:00 1 via ORAL
  Filled 2019-11-18: qty 296

## 2019-11-18 MED ORDER — DOCUSATE SODIUM 100 MG PO CAPS: 100.0000 mg | ORAL_CAPSULE | Freq: Two times a day (BID) | ORAL | 0 refills | Status: AC

## 2019-11-18 MED ORDER — OXYCODONE HCL 5 MG PO TABS: 5.0000 mg | ORAL_TABLET | Freq: Four times a day (QID) | ORAL | 0 refills | Status: DC | PRN

## 2019-11-18 MED ORDER — SENNA 8.6 MG PO TABS: 1.0000 | ORAL_TABLET | Freq: Two times a day (BID) | ORAL | 0 refills | Status: AC

## 2019-11-18 NOTE — Discharge Summary (Signed)
Physician Discharge Summary  Jasmine Jenkins ZOX:[id number] DOB: Jan 13, 1926 DOA: 11/10/2019  PCP: Kirby Funk, MD  Admit date: 11/10/2019 Discharge date: 11/18/2019  Admitted From: Home Disposition:  [facility]  Recommendations for Outpatient Follow-up:  1. Follow up with PCP in 1-2 weeks 2. Follow-up with orthopedics, Dr. Susa Simmonds for 2-week postoperative check 3. Follow-up with gastroenterology, Dr. Bosie Clos as scheduled 4. Metoprolol succinate increased to 75 mg p.o. daily for better rate control of her atrial fibrillation  Home Health: No Equipment/Devices: None  Discharge Condition: Stable CODE STATUS: Full code Diet recommendation: Pured versus full liquid diet.  Patient has chronic esophageal stricture which is unchanged with dilation on EGD.  Patient usually does well with a more pured diet in the morning and likes normally liquids in the evening.  History of present illness:  Jasmine Jenkins is a 86 y.o.femalewith medical history significant ofpermanent A. fib on Xarelto, hypertension, diabetes type 2 who presents to the emergency department for the evaluation of right hip pain. Patient is pretty independent and lives by herself. She was working in her yard and she was preparing to [employer] her lawn whenshe had a mechanical fall today, followed by immediate pain and decreased mobility of her right hip. She did not strike her head on the ground. She did not lose any consciousness. She was unable to ambulate after she fell. EMS arrived and she was given 100 mcg of fentanyl en route.  She follows with Dr. Johney Frame for her A. fib. She is on Xarelto for anticoagulation and takes metoprolol for rate control. Her son lives next door.  Hospital course:  Right hip fracture Patient presenting to ED following mechanical fall at home.  X-ray notable for right intratrochanteric fracture with slight varus angulation.  Patient underwent ORIF with cephalomedullary nail by  Dr. Susa Simmonds on 11/11/2019.  Continue home Xarelto for DVT prophylaxis.  Follow-up with orthopedics, Dr. Susa Simmonds in 2 weeks for x-rays and wound check.  Oxycodone 5 mg every 6 hours as needed for moderate or severe pain, try to avoid as much as possible given her advanced age.  Discharging to SNF.  Essential hypertension Continue losartan 50 mg p.o. daily. Metoprolol succinate increased to 75 mg p.o. daily.  Permanent atrial fibrillation with RVR Patient's rate was well controlled at rest, but when she was going down for her EGD, went into A. fib with RVR, likely secondary to adrenergic surge from her underlying pain secondary to hip fracture.  Her home metoprolol succinate was increased to 75 mg p.o. daily with better rate control.  Continue Xarelto for anticoagulation.  Follow-up with cardiology, Dr. Johney Frame as needed.  Type 2 diabetes mellitus Continue glimepiride 2 mg p.o. daily at home.  History of dysphagia with esophageal stricture and spasm Follows with [facility] gastroenterology outpatient, Dr. Bosie Clos.  Has undergone esophageal dilations in the past. Usually tolerates a regular diet in the a.m. but by the evening has more trouble and usually takes and only liquids such as Ensure.  Esophagram noted severe diffuse esophageal spasms with tight esophageal stricture.  Underwent EGD on 11/17/2019 by Dr. Bosie Clos with findings of esophageal stricture status post balloon dilation to 18 mm without any change in the stricture, suggestive that this is a functional obstruction due to age-related dysmotility.  We will continue diet as tolerates, usually does well with a pured diet in the morning/afternoon and patient likes full liquids in the evening.  She is unlikely to be able to tolerate solid food.  Continue  aspiration precautions.  Outpatient follow-up with GI as needed.  Constipation Continue Colace, senna, MiraLAX.  Continue monitor bowel movements closely in the setting of narcotic use  Discharge  Diagnoses:  Active Problems:   HYPERTENSION, BENIGN   CAD, NATIVE VESSEL   Atrial fibrillation (HCC)   Dysphagia   Esophageal stricture    Discharge Instructions  Discharge Instructions    Diet - low sodium heart healthy   Complete by: As directed    Increase activity slowly   Complete by: As directed      Allergies as of 11/18/2019      Reactions   Penicillins Anaphylaxis   Codeine    REACTION: GI Intolerance   Hydrocodone-acetaminophen    REACTION: GI Intolerance      Medication List    TAKE these medications   docusate sodium 100 MG capsule Commonly known as: COLACE Take 1 capsule (100 mg total) by mouth 2 (two) times daily.   glimepiride 2 MG tablet Commonly known as: AMARYL Take 2 mg by mouth daily with breakfast.   ICAPS AREDS FORMULA PO Take 1 capsule by mouth 2 (two) times daily.   losartan 50 MG tablet Commonly known as: COZAAR Take 50 mg by mouth daily.   metoprolol succinate 25 MG 24 hr tablet Commonly known as: TOPROL-XL Take 3 tablets (75 mg total) by mouth daily. Take with or immediately following a meal. Start taking on: November 19, 2019 What changed:   medication strength  how much to take   oxyCODONE 5 MG immediate release tablet Commonly known as: Oxy IR/ROXICODONE Take 1 tablet (5 mg total) by mouth every 6 (six) hours as needed for moderate pain.   polyethylene glycol 17 g packet Commonly known as: MIRALAX / GLYCOLAX Take 17 g by mouth daily. Start taking on: November 19, 2019   senna 8.6 MG Tabs tablet Commonly known as: SENOKOT Take 1 tablet (8.6 mg total) by mouth 2 (two) times daily.   vitamin B-12 1000 MCG tablet Commonly known as: CYANOCOBALAMIN Take 1,000 mcg by mouth daily.   Xarelto 20 MG Tabs tablet Generic drug: rivaroxaban Take 1 tablet by mouth daily with supper.      Follow-up Information    Kirby Funk, MD Follow up.   Specialty: Internal Medicine Contact information: [address] [place] [address] [phone]        Charlott Rakes, MD. Schedule an appointment as soon as possible for a visit in 3 week(s).   Specialty: Gastroenterology Contact information: [address] [place] [address] [phone]        Terance Hart, MD. Schedule an appointment as soon as possible for a visit in 2 week(s).   Specialty: Orthopedic Surgery Why: 2 week post-op visit Contact information: [address] [place] [address] [phone]          Allergies  Allergen Reactions  . Penicillins Anaphylaxis  . Codeine     REACTION: GI Intolerance  . Hydrocodone-Acetaminophen     REACTION: GI Intolerance    Consultations:  Gastroenterology -Dr. Bosie Clos  Orthopedics -Dr. Susa Simmonds   Procedures/Studies: DG CHEST PORT 1 VIEW  Result Date: 11/10/2019 CLINICAL DATA:  Known right femur fracture, surgery planned for tomorrow EXAM: PORTABLE CHEST 1 VIEW COMPARISON:  CT 05/08/2009, esophagram 08/22/2017 FINDINGS: Diffuse mixed interstitial and airspace opacities throughout both lungs with a perihilar and peripheral predominance. Indistinct pulmonary vascularity. No visible pneumothorax or effusion. Biapical pleuroparenchymal scarring is noted. Slight prominence  of the cardiomediastinal silhouette and a moderate hiatal hernia which was seen on comparison study. No acute osseous or soft tissue abnormality. Degenerative changes are present in the imaged spine and shoulders. Telemetry leads overlie the chest. IMPRESSION: Mixed interstitial airspace opacities throughout both lungs with indistinct vascularity and a prominent cardiomediastinal silhouette favor CHF/volume overload likely with some atelectasis though underlying infection is not excluded. Electronically Signed   By: Kreg Shropshire M.D.   On: 11/10/2019 17:29   DG C-Arm 1-60 Min  Result Date: 11/11/2019 CLINICAL DATA:  Proximal right femur fracture EXAM: RIGHT FEMUR 2 VIEWS; DG C-ARM 1-60  MIN COMPARISON:  11/10/2019 FLUOROSCOPY TIME:  Radiation Exposure Index (as provided by the fluoroscopic device): Not available If the device does not provide the exposure index: Fluoroscopy Time:  47 seconds Number of Acquired Images:  4 FINDINGS: Medullary rod and proximal fixation screw is noted. A mid femoral fixation screws seen as well. Fracture fragments are in near anatomic alignment. IMPRESSION: ORIF of proximal right femoral fracture. Electronically Signed   By: Alcide Clever M.D.   On: 11/11/2019 15:12   ECHOCARDIOGRAM COMPLETE  Result Date: 11/11/2019    ECHOCARDIOGRAM REPORT   Patient Name:   ERABELLA KUIPERS Date of Exam: 11/11/2019 Medical Rec #:  [id number]       Height:       65.0 in Accession #:    [id number]      Weight:       127.4 lb Date of Birth:  Sep 13, 1925       BSA:          1.633 m Patient Age:    86 years        BP:           114/68 mmHg Patient Gender: F               HR:           105 bpm. Exam Location:  Inpatient Procedure: 2D Echo Indications:    CHF-Acute diastolic 428.31  History:        Patient has prior history of Echocardiogram examinations, most                 recent 10/23/2011. Arrythmias:Atrial Fibrillation; Risk                 Factors:Hypertension and Diabetes.  Sonographer:    Thurman Coyer RDCS (AE) Referring Phys: [id number] AMRIT ADHIKARI IMPRESSIONS  1. Left ventricular ejection fraction, by estimation, is 60 to 65%. The left ventricle has normal function. The left ventricle has no regional wall motion abnormalities. There is mild concentric left ventricular hypertrophy. Left ventricular diastolic function could not be evaluated.  2. Right ventricular systolic function is mildly reduced. The right ventricular size is mildly enlarged. Mildly increased right ventricular wall thickness. There is moderately elevated pulmonary artery systolic pressure. The estimated right ventricular systolic pressure is 58.1 mmHg.  3. Left atrial size was severely dilated.  4. Right  atrial size was severely dilated.  5. The mitral valve is normal in structure. No evidence of mitral valve regurgitation.  6. Tricuspid valve regurgitation is moderate to severe.  7. The aortic valve is tricuspid. Aortic valve regurgitation is not visualized. Mild aortic valve stenosis.  8. The inferior vena cava is dilated in size with >50% respiratory variability, suggesting right atrial pressure of 8 mmHg. Comparison(s): Prior images unable to be directly viewed, comparison made by report only. Pulmonary hypertension is worse , otherwise  little change. FINDINGS  Left Ventricle: Left ventricular ejection fraction, by estimation, is 60 to 65%. The left ventricle has normal function. The left ventricle has no regional wall motion abnormalities. The left ventricular internal cavity size was normal in size. There is  mild concentric left ventricular hypertrophy. Left ventricular diastolic function could not be evaluated due to atrial fibrillation. Left ventricular diastolic function could not be evaluated. Right Ventricle: The right ventricular size is mildly enlarged. Mildly increased right ventricular wall thickness. Right ventricular systolic function is mildly reduced. There is moderately elevated pulmonary artery systolic pressure. The tricuspid regurgitant velocity is 3.54 m/s, and with an assumed right atrial pressure of 8 mmHg, the estimated right ventricular systolic pressure is 58.1 mmHg. Left Atrium: Left atrial size was severely dilated. Right Atrium: Right atrial size was severely dilated. Pericardium: There is no evidence of pericardial effusion. Mitral Valve: The mitral valve is normal in structure. No evidence of mitral valve regurgitation. Tricuspid Valve: The tricuspid valve is normal in structure. Tricuspid valve regurgitation is moderate to severe. Aortic Valve: The aortic valve is tricuspid. . There is moderate thickening and moderate calcification of the aortic valve. Aortic valve regurgitation  is not visualized. Mild aortic stenosis is present. There is moderate thickening of the aortic valve. There is moderate calcification of the aortic valve. Aortic valve mean gradient measures 8.7 mmHg. Aortic valve peak gradient measures 16.5 mmHg. Aortic valve area, by VTI measures 1.25 cm. Pulmonic Valve: The pulmonic valve was grossly normal. Pulmonic valve regurgitation is trivial. Aorta: The aortic root and ascending aorta are structurally normal, with no evidence of dilitation. Venous: The inferior vena cava is dilated in size with greater than 50% respiratory variability, suggesting right atrial pressure of 8 mmHg. IAS/Shunts: No atrial level shunt detected by color flow Doppler.  LEFT VENTRICLE PLAX 2D LVIDd:         2.90 cm LVIDs:         2.30 cm LV PW:         1.20 cm LV IVS:        1.30 cm LVOT diam:     2.00 cm LV SV:         45 LV SV Index:   28 LVOT Area:     3.14 cm  LEFT ATRIUM              Index       RIGHT ATRIUM           Index LA diam:        3.80 cm  2.33 cm/m  RA Area:     19.40 cm LA Vol (A2C):   119.0 ml 72.86 ml/m RA Volume:   51.60 ml  31.59 ml/m LA Vol (A4C):   70.4 ml  43.10 ml/m LA Biplane Vol: 92.3 ml  56.51 ml/m  AORTIC VALVE AV Area (Vmax):    1.25 cm AV Area (Vmean):   1.17 cm AV Area (VTI):     1.25 cm AV Vmax:           203.00 cm/s AV Vmean:          138.333 cm/s AV VTI:            0.360 m AV Peak Grad:      16.5 mmHg AV Mean Grad:      8.7 mmHg LVOT Vmax:         80.83 cm/s LVOT Vmean:        51.333 cm/s LVOT  VTI:          0.143 m LVOT/AV VTI ratio: 0.40  AORTA Ao Root diam: 3.00 cm TRICUSPID VALVE TR Peak grad:   50.1 mmHg TR Vmax:        354.00 cm/s  SHUNTS Systemic VTI:  0.14 m Systemic Diam: 2.00 cm Thurmon FairMihai Croitoru MD Electronically signed by Thurmon FairMihai Croitoru MD Signature Date/Time: 11/11/2019/4:21:33 PM    Final    DG Hip Port [place] or [place] Pelvis 1 View Right  Result Date: 11/10/2019 CLINICAL DATA:  Fall, right hip EXAM: DG HIP (WITH OR WITHOUT PELVIS) 1V PORT  RIGHT COMPARISON:  None. FINDINGS: There is a right femoral intertrochanteric fracture. Slight varus angulation. No subluxation or dislocation. IMPRESSION: Right intertrochanteric fracture with slight varus angulation. Electronically Signed   By: Charlett NoseKevin  Dover M.D.   On: 11/10/2019 15:25   DG FEMUR, MIN 2 VIEWS RIGHT  Result Date: 11/11/2019 CLINICAL DATA:  Proximal right femur fracture EXAM: RIGHT FEMUR 2 VIEWS; DG C-ARM 1-60 MIN COMPARISON:  11/10/2019 FLUOROSCOPY TIME:  Radiation Exposure Index (as provided by the fluoroscopic device): Not available If the device does not provide the exposure index: Fluoroscopy Time:  47 seconds Number of Acquired Images:  4 FINDINGS: Medullary rod and proximal fixation screw is noted. A mid femoral fixation screws seen as well. Fracture fragments are in near anatomic alignment. IMPRESSION: ORIF of proximal right femoral fracture. Electronically Signed   By: Alcide CleverMark  Lukens M.D.   On: 11/11/2019 15:12   DG FEMUR, MIN 2 VIEWS RIGHT  Result Date: 11/10/2019 CLINICAL DATA:  Known right hip fracture, surgery planned for tomorrow EXAM: RIGHT FEMUR 2 VIEWS COMPARISON:  Radiograph 11/10/2019 FINDINGS: There is redemonstration of the varus angulated right intertrochanteric femur fracture. The femoral head remains normally located. The distal femur is intact. Alignment at the knee is grossly preserved albeit with moderate tricompartmental degenerative changes and meniscal chondrocalcinosis of both medial and lateral compartments as well as arthrosis with the corticated fabella posteriorly. Vascular calcium noted at the knee as well. IMPRESSION: 1. Varus angulated right intertrochanteric femur fracture. No distal femoral fractures. 2. 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 Electronically Signed   By: Kreg ShropshirePrice  DeHay M.D.   On: 11/10/2019 17:27   DG ESOPHAGUS W SINGLE CM (SOL OR THIN BA)  Result Date: 11/12/2019 CLINICAL DATA:  Dysphagia. EXAM:  ESOPHOGRAM/BARIUM SWALLOW TECHNIQUE: Single contrast examination was performed using  thin barium. FLUOROSCOPY TIME:  Fluoroscopy Time:  2 minutes and 26 seconds Radiation Exposure Index (if provided by the fluoroscopic device): 36.4 mGy Number of Acquired Spot Images: 0 COMPARISON:  08/22/2017 FINDINGS: Severe diffuse esophageal spasm as demonstrated on prior study. Very tight distal esophageal stricture very similar to the prior study with smooth strictured narrowing and very little passage of barium into the stomach. Persistent small hiatal hernia. No findings for laryngeal penetration or aspiration. IMPRESSION: 1. Severe diffuse esophageal spasm with a very tight distal esophageal stricture very similar to the prior study. 2. Persistent small hiatal hernia. Electronically Signed   By: Rudie MeyerP.  Gallerani M.D.   On: 11/12/2019 13:16      Subjective: Patient seen and examined bedside, resting comfortably.  Son present.  No complaints this morning.  Ready for discharge to SNF.  Pain fairly well controlled.  Denies headache, no fever/chills/night sweats, no nausea/vomiting/diarrhea, no chest pain, no palpitations, no shortness of breath, no abdominal pain, no weakness, no fatigue, no cough/congestion.  No acute events overnight per nursing staff.  Discharge  Exam: Vitals:   11/18/19 0313 11/18/19 0804  BP: 137/72 133/84  Pulse: 95 94  Resp: 17 17  Temp: 97.7 F (36.5 C) 98.3 F (36.8 C)  SpO2: 94% 99%   Vitals:   11/17/19 1128 11/17/19 1915 11/18/19 0313 11/18/19 0804  BP: 127/62 (!) 144/76 137/72 133/84  Pulse: 95 86 95 94  Resp: (!) 24 16 17 17   Temp:  98.4 F (36.9 C) 97.7 F (36.5 C) 98.3 F (36.8 C)  TempSrc:  Oral Oral Oral  SpO2: 98% 99% 94% 99%  Weight:      Height:        General: Pt is alert, awake, not in acute distress Cardiovascular: RRR, S1/S2 +, no rubs, no gallops Respiratory: CTA bilaterally, no wheezing, no rhonchi Abdominal: Soft, NT, ND, bowel sounds + Extremities:  no edema, no cyanosis    The results of significant diagnostics from this hospitalization (including imaging, microbiology, ancillary and laboratory) are listed below for reference.     Microbiology: Recent Results (from the past 240 hour(s))  Respiratory Panel by RT PCR (Flu A&B, Covid) - Nasopharyngeal Swab     Status: None   Collection Time: 11/10/19  3:47 PM   Specimen: Nasopharyngeal Swab  Result Value Ref Range Status   SARS Coronavirus 2 by RT PCR NEGATIVE NEGATIVE Final    Comment: (NOTE) SARS-CoV-2 target nucleic acids are NOT DETECTED. The SARS-CoV-2 RNA is generally detectable in upper respiratoy specimens during the acute phase of infection. The lowest concentration of SARS-CoV-2 viral copies this assay can detect is 131 copies/mL. A negative result does not preclude SARS-Cov-2 infection and should not be used as the sole basis for treatment or other patient management decisions. A negative result may occur with  improper specimen collection/handling, submission of specimen other than nasopharyngeal swab, presence of viral mutation(s) within the areas targeted by this assay, and inadequate number of viral copies (<131 copies/mL). A negative result must be combined with clinical observations, patient history, and epidemiological information. The expected result is Negative. Fact Sheet for Patients:  01/10/20 Fact Sheet for Healthcare Providers:  https://www.moore.com/ This test is not yet ap proved or cleared by the https://www.young.biz/ FDA and  has been authorized for detection and/or diagnosis of SARS-CoV-2 by FDA under an Emergency Use Authorization (EUA). This EUA will remain  in effect (meaning this test can be used) for the duration of the COVID-19 declaration under Section 564(b)(1) of the Act, 21 U.S.C. section 360bbb-3(b)(1), unless the authorization is terminated or revoked sooner.    Influenza A by PCR NEGATIVE  NEGATIVE Final   Influenza B by PCR NEGATIVE NEGATIVE Final    Comment: (NOTE) The Xpert Xpress SARS-CoV-2/FLU/RSV assay is intended as an aid in  the diagnosis of influenza from Nasopharyngeal swab specimens and  should not be used as a sole basis for treatment. Nasal washings and  aspirates are unacceptable for Xpert Xpress SARS-CoV-2/FLU/RSV  testing. Fact Sheet for Patients: [place] Fact Sheet for Healthcare Providers: https://www.moore.com/ This test is not yet approved or cleared by the https://www.young.biz/ FDA and  has been authorized for detection and/or diagnosis of SARS-CoV-2 by  FDA under an Emergency Use Authorization (EUA). This EUA will remain  in effect (meaning this test can be used) for the duration of the  Covid-19 declaration under Section 564(b)(1) of the Act, 21  U.S.C. section 360bbb-3(b)(1), unless the authorization is  terminated or revoked. Performed at [facility], [address] [place], [place]  Surgical pcr screen     Status: None   Collection Time: 11/10/19  9:27 PM   Specimen: Nasal Mucosa; Nasal Swab  Result Value Ref Range Status   MRSA, PCR NEGATIVE NEGATIVE Final   Staphylococcus aureus NEGATIVE NEGATIVE Final    Comment: (NOTE) The Xpert SA Assay (FDA approved for NASAL specimens in patients 24 years of age and older), is one component of a comprehensive surveillance program. It is not intended to diagnose infection nor to guide or monitor treatment. Performed at [facility], [address], [place], [place] [address]   SARS CORONAVIRUS 2 (TAT 6-24 HRS) Nasopharyngeal Nasopharyngeal Swab     Status: None   Collection Time: 11/13/19  4:50 PM   Specimen: Nasopharyngeal Swab  Result Value Ref Range Status   SARS Coronavirus 2 NEGATIVE NEGATIVE Final    Comment: (NOTE) SARS-CoV-2 target nucleic acids are NOT DETECTED. The SARS-CoV-2 RNA is generally  detectable in upper and lower respiratory specimens during the acute phase of infection. Negative results do not preclude SARS-CoV-2 infection, do not rule out co-infections with other pathogens, and should not be used as the sole basis for treatment or other patient management decisions. Negative results must be combined with clinical observations, patient history, and epidemiological information. The expected result is Negative. Fact Sheet for Patients: HairSlick.no Fact Sheet for Healthcare Providers: quierodirigir.com This test is not yet approved or cleared by the [place] FDA and  has been authorized for detection and/or diagnosis of SARS-CoV-2 by FDA under an Emergency Use Authorization (EUA). This EUA will remain  in effect (meaning this test can be used) for the duration of the COVID-19 declaration under Section 56 4(b)(1) of the Act, 21 U.S.C. section 360bbb-3(b)(1), unless the authorization is terminated or revoked sooner. Performed at [facility], [address], [place], [place] [address]   SARS CORONAVIRUS 2 (TAT 6-24 HRS) Nasopharyngeal Nasopharyngeal Swab     Status: None   Collection Time: 11/17/19  3:41 PM   Specimen: Nasopharyngeal Swab  Result Value Ref Range Status   SARS Coronavirus 2 NEGATIVE NEGATIVE Final    Comment: (NOTE) SARS-CoV-2 target nucleic acids are NOT DETECTED. The SARS-CoV-2 RNA is generally detectable in upper and lower respiratory specimens during the acute phase of infection. Negative results do not preclude SARS-CoV-2 infection, do not rule out co-infections with other pathogens, and should not be used as the sole basis for treatment or other patient management decisions. Negative results must be combined with clinical observations, patient history, and epidemiological information. The expected result is Negative. Fact Sheet for  Patients: HairSlick.no Fact Sheet for Healthcare Providers: quierodirigir.com This test is not yet approved or cleared by the [place] FDA and  has been authorized for detection and/or diagnosis of SARS-CoV-2 by FDA under an Emergency Use Authorization (EUA). This EUA will remain  in effect (meaning this test can be used) for the duration of the COVID-19 declaration under Section 56 4(b)(1) of the Act, 21 U.S.C. section 360bbb-3(b)(1), unless the authorization is terminated or revoked sooner. Performed at [facility], [address] [place], [place] [address]      Labs: BNP (last 3 results) Recent Labs    11/10/19 1536  BNP 239.6*   Basic Metabolic Panel: Recent Labs  Lab 11/12/19 0227 11/13/19 0512 11/16/19 0304  NA 134* 135 139  K 4.1 4.3 4.4  CL 98 99 105  CO2 GLUCOSE 275* 180* 156*  BUN  25* 33* 15  CREATININE 1.05* 1.01* 0.64  CALCIUM 8.8* 8.8* 8.7*  MG  --   --  2.0   Liver Function Tests: No results for input(s): AST, ALT, ALKPHOS, BILITOT, PROT, ALBUMIN in the last 168 hours. No results for input(s): LIPASE, AMYLASE in the last 168 hours. No results for input(s): AMMONIA in the last 168 hours. CBC: Recent Labs  Lab 11/12/19 0227 11/13/19 0512  WBC 12.2* 14.5*  HGB 9.2* 8.8*  HCT 29.4* 28.1*  MCV 91.9 91.2  PLT 120* 118*   Cardiac Enzymes: No results for input(s): CKTOTAL, CKMB, CKMBINDEX, TROPONINI in the last 168 hours. BNP: Invalid input(s): POCBNP CBG: Recent Labs  Lab 11/17/19 0849 11/17/19 1213 11/17/19 1601 11/17/19 2058 11/18/19 0715  GLUCAP 140* 101* 131* 132* 128*   D-Dimer No results for input(s): DDIMER in the last 72 hours. Hgb A1c No results for input(s): HGBA1C in the last 72 hours. Lipid Profile No results for input(s): CHOL, HDL, LDLCALC, TRIG, CHOLHDL, LDLDIRECT in the last 72 hours. Thyroid function studies No results for input(s): TSH,  T4TOTAL, T3FREE, THYROIDAB in the last 72 hours.  Invalid input(s): FREET3 Anemia work up No results for input(s): VITAMINB12, FOLATE, FERRITIN, TIBC, IRON, RETICCTPCT in the last 72 hours. Urinalysis    Component Value Date/Time   COLORURINE YELLOW 11/10/2019 1844   APPEARANCEUR CLEAR 11/10/2019 1844   LABSPEC 1.012 11/10/2019 1844   PHURINE 6.0 11/10/2019 1844   GLUCOSEU >=500 (A) 11/10/2019 1844   HGBUR NEGATIVE 11/10/2019 1844   BILIRUBINUR NEGATIVE 11/10/2019 1844   KETONESUR NEGATIVE 11/10/2019 1844   PROTEINUR NEGATIVE 11/10/2019 1844   NITRITE NEGATIVE 11/10/2019 1844   LEUKOCYTESUR NEGATIVE 11/10/2019 1844   Sepsis Labs Invalid input(s): PROCALCITONIN,  WBC,  LACTICIDVEN Microbiology Recent Results (from the past 240 hour(s))  Respiratory Panel by RT PCR (Flu A&B, Covid) - Nasopharyngeal Swab     Status: None   Collection Time: 11/10/19  3:47 PM   Specimen: Nasopharyngeal Swab  Result Value Ref Range Status   SARS Coronavirus 2 by RT PCR NEGATIVE NEGATIVE Final    Comment: (NOTE) SARS-CoV-2 target nucleic acids are NOT DETECTED. The SARS-CoV-2 RNA is generally detectable in upper respiratoy specimens during the acute phase of infection. The lowest concentration of SARS-CoV-2 viral copies this assay can detect is 131 copies/mL. A negative result does not preclude SARS-Cov-2 infection and should not be used as the sole basis for treatment or other patient management decisions. A negative result may occur with  improper specimen collection/handling, submission of specimen other than nasopharyngeal swab, presence of viral mutation(s) within the areas targeted by this assay, and inadequate number of viral copies (<131 copies/mL). A negative result must be combined with clinical observations, patient history, and epidemiological information. The expected result is Negative. Fact Sheet for Patients:  https://www.moore.com/ Fact Sheet for Healthcare  Providers:  https://www.young.biz/ This test is not yet ap proved or cleared by the [place] FDA and  has been authorized for detection and/or diagnosis of SARS-CoV-2 by FDA under an Emergency Use Authorization (EUA). This EUA will remain  in effect (meaning this test can be used) for the duration of the COVID-19 declaration under Section 564(b)(1) of the Act, 21 U.S.C. section 360bbb-3(b)(1), unless the authorization is terminated or revoked sooner.    Influenza A by PCR NEGATIVE NEGATIVE Final   Influenza B by PCR NEGATIVE NEGATIVE Final    Comment: (NOTE) The Xpert Xpress SARS-CoV-2/FLU/RSV assay is intended as an aid in  the  diagnosis of influenza from Nasopharyngeal swab specimens and  should not be used as a sole basis for treatment. Nasal washings and  aspirates are unacceptable for Xpert Xpress SARS-CoV-2/FLU/RSV  testing. Fact Sheet for Patients: PinkCheek.be Fact Sheet for Healthcare Providers: GravelBags.it This test is not yet approved or cleared by the [place] FDA and  has been authorized for detection and/or diagnosis of SARS-CoV-2 by  FDA under an Emergency Use Authorization (EUA). This EUA will remain  in effect (meaning this test can be used) for the duration of the  Covid-19 declaration under Section 564(b)(1) of the Act, 21  U.S.C. section 360bbb-3(b)(1), unless the authorization is  terminated or revoked. Performed at [facility], [place] [address], [place], [place] [address]   Surgical pcr screen     Status: None   Collection Time: 11/10/19  9:27 PM   Specimen: Nasal Mucosa; Nasal Swab  Result Value Ref Range Status   MRSA, PCR NEGATIVE NEGATIVE Final   Staphylococcus aureus NEGATIVE NEGATIVE Final    Comment: (NOTE) The Xpert SA Assay (FDA approved for NASAL specimens in patients 74 years of age and older), is one component of a  comprehensive surveillance program. It is not intended to diagnose infection nor to guide or monitor treatment. Performed at [facility], [place] [address] [place], [place] [address]   SARS CORONAVIRUS 2 (TAT 6-24 HRS) Nasopharyngeal Nasopharyngeal Swab     Status: None   Collection Time: 11/13/19  4:50 PM   Specimen: Nasopharyngeal Swab  Result Value Ref Range Status   SARS Coronavirus 2 NEGATIVE NEGATIVE Final    Comment: (NOTE) SARS-CoV-2 target nucleic acids are NOT DETECTED. The SARS-CoV-2 RNA is generally detectable in upper and lower respiratory specimens during the acute phase of infection. Negative results do not preclude SARS-CoV-2 infection, do not rule out co-infections with other pathogens, and should not be used as the sole basis for treatment or other patient management decisions. Negative results must be combined with clinical observations, patient history, and epidemiological information. The expected result is Negative. Fact Sheet for Patients: SugarRoll.be Fact Sheet for Healthcare Providers: https://www.woods-mathews.com/ This test is not yet approved or cleared by the [place] FDA and  has been authorized for detection and/or diagnosis of SARS-CoV-2 by FDA under an Emergency Use Authorization (EUA). This EUA will remain  in effect (meaning this test can be used) for the duration of the COVID-19 declaration under Section 56 4(b)(1) of the Act, 21 U.S.C. section 360bbb-3(b)(1), unless the authorization is terminated or revoked sooner. Performed at [facility], [place] [address], [place], [place] [address]   SARS CORONAVIRUS 2 (TAT 6-24 HRS) Nasopharyngeal Nasopharyngeal Swab     Status: None   Collection Time: 11/17/19  3:41 PM   Specimen: Nasopharyngeal Swab  Result Value Ref Range Status   SARS Coronavirus 2 NEGATIVE NEGATIVE Final    Comment: (NOTE) SARS-CoV-2 target nucleic acids are NOT  DETECTED. The SARS-CoV-2 RNA is generally detectable in upper and lower respiratory specimens during the acute phase of infection. Negative results do not preclude SARS-CoV-2 infection, do not rule out co-infections with other pathogens, and should not be used as the sole basis for treatment or other patient management decisions. Negative results must be combined with clinical observations, patient history, and epidemiological information. The expected result is Negative. Fact Sheet for Patients: SugarRoll.be Fact Sheet for Healthcare Providers: https://www.woods-mathews.com/ This test is not yet approved or cleared by the [place] FDA and  has been authorized for detection and/or diagnosis of SARS-CoV-2 by FDA under an Emergency Use Authorization (EUA). This EUA will remain  in effect (meaning this test can be used) for the duration of the COVID-19 declaration under Section 56 4(b)(1) of the Act, 21 U.S.C. section 360bbb-3(b)(1), unless the authorization is terminated or revoked sooner. Performed at [facility], [address], [place], [place] [address]      Time coordinating discharge: Over 30 minutes  SIGNED:   Alvira Philips [place], DO  [facility] 11/18/2019, 10:06 AM

## 2019-11-18 NOTE — Progress Notes (Signed)
SNF auth. -[id number] x 5 days, next review 4/22. Bjorn Pippin CM , fax # [phone] for future clinicals from   [facility]. Gae Gallop RN,BSN,CM

## 2019-11-18 NOTE — Progress Notes (Signed)
Patient discharging to [facility], report called in to [facility] LPN, awaiting transportation.

## 2019-11-18 NOTE — TOC Transition Note (Signed)
Transition of Care [facility]) - CM/SW Discharge Note   Patient Details  Name: Jasmine Jenkins MRN: [id number] Date of Birth: 12/06/1925  [facility] CM/SW Contact:  Epifanio Lesches, RN Phone Number: [phone] 11/18/2019, 8:55 AM   Clinical Narrative:    Patient will DC to: [facility] Anticipated DC date: 11/18/2019 Family notified: Molly Maduro Transport by: Sharin Mons   Per MD patient ready for DC to [facility] . RN, patient, patient's family, and facility notified of DC. Discharge Summary and FL2 sent to facility. RN to call report prior to discharge ([phone]). DC packet on chart. Ambulance transport requested for patient.    Misbah Hornaday (Son)      [phone]       RNCM will sign off for now as intervention is no longer needed. Please consult [place] again if new needs arise.    Final next level of care: Skilled Nursing Facility Barriers to Discharge: No Barriers Identified   Patient Goals and CMS Choice        Discharge Placement     Discharge Plan and Services            Social Determinants of Health (SDOH) Interventions     Readmission Risk Interventions No flowsheet data found.

## 2019-11-18 NOTE — Progress Notes (Addendum)
Updated clinicals faxed to [facility] @ [phone] for continued SNF authorization.  Gae Gallop [facility], [phone], option 3

## 2019-11-20 DIAGNOSIS — I10 Essential (primary) hypertension: Secondary | ICD-10-CM | POA: Diagnosis not present

## 2019-11-20 DIAGNOSIS — I4891 Unspecified atrial fibrillation: Secondary | ICD-10-CM | POA: Diagnosis not present

## 2019-11-20 DIAGNOSIS — S72001A Fracture of unspecified part of neck of right femur, initial encounter for closed fracture: Secondary | ICD-10-CM | POA: Diagnosis not present

## 2019-11-20 DIAGNOSIS — D6869 Other thrombophilia: Secondary | ICD-10-CM | POA: Diagnosis not present

## 2019-12-01 DIAGNOSIS — S72141A Displaced intertrochanteric fracture of right femur, initial encounter for closed fracture: Secondary | ICD-10-CM | POA: Diagnosis not present

## 2019-12-03 DIAGNOSIS — I4891 Unspecified atrial fibrillation: Secondary | ICD-10-CM | POA: Diagnosis not present

## 2019-12-04 DIAGNOSIS — Z9181 History of falling: Secondary | ICD-10-CM | POA: Diagnosis not present

## 2019-12-04 DIAGNOSIS — E119 Type 2 diabetes mellitus without complications: Secondary | ICD-10-CM | POA: Diagnosis not present

## 2019-12-04 DIAGNOSIS — I4821 Permanent atrial fibrillation: Secondary | ICD-10-CM | POA: Diagnosis not present

## 2019-12-04 DIAGNOSIS — I2510 Atherosclerotic heart disease of native coronary artery without angina pectoris: Secondary | ICD-10-CM | POA: Diagnosis not present

## 2019-12-04 DIAGNOSIS — K5900 Constipation, unspecified: Secondary | ICD-10-CM | POA: Diagnosis not present

## 2019-12-04 DIAGNOSIS — S72141D Displaced intertrochanteric fracture of right femur, subsequent encounter for closed fracture with routine healing: Secondary | ICD-10-CM | POA: Diagnosis not present

## 2019-12-04 DIAGNOSIS — I10 Essential (primary) hypertension: Secondary | ICD-10-CM | POA: Diagnosis not present

## 2019-12-04 DIAGNOSIS — Z7901 Long term (current) use of anticoagulants: Secondary | ICD-10-CM | POA: Diagnosis not present

## 2019-12-04 DIAGNOSIS — Z79891 Long term (current) use of opiate analgesic: Secondary | ICD-10-CM | POA: Diagnosis not present

## 2019-12-04 DIAGNOSIS — R1310 Dysphagia, unspecified: Secondary | ICD-10-CM | POA: Diagnosis not present

## 2019-12-04 DIAGNOSIS — I350 Nonrheumatic aortic (valve) stenosis: Secondary | ICD-10-CM | POA: Diagnosis not present

## 2019-12-04 DIAGNOSIS — K222 Esophageal obstruction: Secondary | ICD-10-CM | POA: Diagnosis not present

## 2019-12-10 DIAGNOSIS — S72141D Displaced intertrochanteric fracture of right femur, subsequent encounter for closed fracture with routine healing: Secondary | ICD-10-CM | POA: Diagnosis not present

## 2019-12-17 DIAGNOSIS — R609 Edema, unspecified: Secondary | ICD-10-CM | POA: Diagnosis not present

## 2020-01-01 DIAGNOSIS — D649 Anemia, unspecified: Secondary | ICD-10-CM | POA: Diagnosis not present

## 2020-01-01 DIAGNOSIS — E119 Type 2 diabetes mellitus without complications: Secondary | ICD-10-CM | POA: Diagnosis not present

## 2020-01-02 DIAGNOSIS — S72141A Displaced intertrochanteric fracture of right femur, initial encounter for closed fracture: Secondary | ICD-10-CM | POA: Diagnosis not present

## 2020-01-05 DIAGNOSIS — E1140 Type 2 diabetes mellitus with diabetic neuropathy, unspecified: Secondary | ICD-10-CM | POA: Diagnosis not present

## 2020-01-05 DIAGNOSIS — L603 Nail dystrophy: Secondary | ICD-10-CM | POA: Diagnosis not present

## 2020-01-05 DIAGNOSIS — M79673 Pain in unspecified foot: Secondary | ICD-10-CM | POA: Diagnosis not present

## 2020-01-05 DIAGNOSIS — B351 Tinea unguium: Secondary | ICD-10-CM | POA: Diagnosis not present

## 2020-01-06 DIAGNOSIS — K222 Esophageal obstruction: Secondary | ICD-10-CM | POA: Diagnosis not present

## 2020-01-06 DIAGNOSIS — I350 Nonrheumatic aortic (valve) stenosis: Secondary | ICD-10-CM | POA: Diagnosis not present

## 2020-01-06 DIAGNOSIS — I4821 Permanent atrial fibrillation: Secondary | ICD-10-CM | POA: Diagnosis not present

## 2020-01-06 DIAGNOSIS — I2510 Atherosclerotic heart disease of native coronary artery without angina pectoris: Secondary | ICD-10-CM | POA: Diagnosis not present

## 2020-01-06 DIAGNOSIS — R1310 Dysphagia, unspecified: Secondary | ICD-10-CM | POA: Diagnosis not present

## 2020-01-06 DIAGNOSIS — S72141D Displaced intertrochanteric fracture of right femur, subsequent encounter for closed fracture with routine healing: Secondary | ICD-10-CM | POA: Diagnosis not present

## 2020-01-06 DIAGNOSIS — E119 Type 2 diabetes mellitus without complications: Secondary | ICD-10-CM | POA: Diagnosis not present

## 2020-01-06 DIAGNOSIS — Z7901 Long term (current) use of anticoagulants: Secondary | ICD-10-CM | POA: Diagnosis not present

## 2020-01-06 DIAGNOSIS — Z79891 Long term (current) use of opiate analgesic: Secondary | ICD-10-CM | POA: Diagnosis not present

## 2020-01-06 DIAGNOSIS — Z9181 History of falling: Secondary | ICD-10-CM | POA: Diagnosis not present

## 2020-01-06 DIAGNOSIS — K5900 Constipation, unspecified: Secondary | ICD-10-CM | POA: Diagnosis not present

## 2020-01-06 DIAGNOSIS — I10 Essential (primary) hypertension: Secondary | ICD-10-CM | POA: Diagnosis not present

## 2020-01-27 DIAGNOSIS — I482 Chronic atrial fibrillation, unspecified: Secondary | ICD-10-CM | POA: Diagnosis not present

## 2020-01-27 DIAGNOSIS — I10 Essential (primary) hypertension: Secondary | ICD-10-CM | POA: Diagnosis not present

## 2020-01-27 DIAGNOSIS — H6123 Impacted cerumen, bilateral: Secondary | ICD-10-CM | POA: Diagnosis not present

## 2020-01-27 DIAGNOSIS — H900 Conductive hearing loss, bilateral: Secondary | ICD-10-CM | POA: Diagnosis not present

## 2020-01-27 DIAGNOSIS — E1169 Type 2 diabetes mellitus with other specified complication: Secondary | ICD-10-CM | POA: Diagnosis not present

## 2020-04-02 DIAGNOSIS — L570 Actinic keratosis: Secondary | ICD-10-CM | POA: Diagnosis not present

## 2020-04-02 DIAGNOSIS — X32XXXD Exposure to sunlight, subsequent encounter: Secondary | ICD-10-CM | POA: Diagnosis not present

## 2020-04-02 DIAGNOSIS — Z08 Encounter for follow-up examination after completed treatment for malignant neoplasm: Secondary | ICD-10-CM | POA: Diagnosis not present

## 2020-04-02 DIAGNOSIS — Z85828 Personal history of other malignant neoplasm of skin: Secondary | ICD-10-CM | POA: Diagnosis not present

## 2020-04-15 DIAGNOSIS — H43812 Vitreous degeneration, left eye: Secondary | ICD-10-CM | POA: Diagnosis not present

## 2020-04-15 DIAGNOSIS — H2513 Age-related nuclear cataract, bilateral: Secondary | ICD-10-CM | POA: Diagnosis not present

## 2020-04-15 DIAGNOSIS — H353131 Nonexudative age-related macular degeneration, bilateral, early dry stage: Secondary | ICD-10-CM | POA: Diagnosis not present

## 2020-04-15 DIAGNOSIS — E119 Type 2 diabetes mellitus without complications: Secondary | ICD-10-CM | POA: Diagnosis not present

## 2020-04-16 DIAGNOSIS — S72141A Displaced intertrochanteric fracture of right femur, initial encounter for closed fracture: Secondary | ICD-10-CM | POA: Diagnosis not present

## 2020-04-16 DIAGNOSIS — H524 Presbyopia: Secondary | ICD-10-CM | POA: Diagnosis not present

## 2020-05-15 ENCOUNTER — Ambulatory Visit: Payer: Medicare Other | Attending: Internal Medicine

## 2020-05-15 NOTE — Progress Notes (Signed)
° °  Covid-19 Vaccination Clinic  Name:  Jasmine Jenkins    MRN: [id number] DOB: [birth year]  05/15/2020  [name] was observed post Covid-19 immunization for 15 minutes without incident. She was provided with Vaccine Information Sheet and instruction to access the V-Safe system.   [name] was instructed to call [phone] with any severe reactions post vaccine:  Difficulty breathing   Swelling of face and throat   A fast heartbeat   A bad rash all over body   Dizziness and weakness

## 2020-05-28 ENCOUNTER — Ambulatory Visit: Payer: Medicare Other

## 2020-08-19 DIAGNOSIS — I482 Chronic atrial fibrillation, unspecified: Secondary | ICD-10-CM | POA: Diagnosis not present

## 2020-08-19 DIAGNOSIS — E1169 Type 2 diabetes mellitus with other specified complication: Secondary | ICD-10-CM | POA: Diagnosis not present

## 2020-08-19 DIAGNOSIS — I10 Essential (primary) hypertension: Secondary | ICD-10-CM | POA: Diagnosis not present

## 2020-08-19 DIAGNOSIS — Z1389 Encounter for screening for other disorder: Secondary | ICD-10-CM | POA: Diagnosis not present

## 2020-08-19 DIAGNOSIS — K224 Dyskinesia of esophagus: Secondary | ICD-10-CM | POA: Diagnosis not present

## 2020-08-19 DIAGNOSIS — Z0000 Encounter for general adult medical examination without abnormal findings: Secondary | ICD-10-CM | POA: Diagnosis not present

## 2020-08-31 ENCOUNTER — Other Ambulatory Visit (HOSPITAL_COMMUNITY): Payer: Self-pay

## 2020-08-31 DIAGNOSIS — R1310 Dysphagia, unspecified: Secondary | ICD-10-CM | POA: Diagnosis not present

## 2020-08-31 DIAGNOSIS — K222 Esophageal obstruction: Secondary | ICD-10-CM | POA: Diagnosis not present

## 2020-09-06 ENCOUNTER — Ambulatory Visit (HOSPITAL_COMMUNITY)
Admission: RE | Admit: 2020-09-06 | Discharge: 2020-09-06 | Disposition: A | Discharge status: Home / Self Care | Payer: Medicare Other | Source: Ambulatory Visit | Attending: Internal Medicine | Admitting: Internal Medicine

## 2020-09-06 ENCOUNTER — Other Ambulatory Visit: Payer: Self-pay

## 2020-09-06 DIAGNOSIS — K219 Gastro-esophageal reflux disease without esophagitis: Secondary | ICD-10-CM | POA: Diagnosis not present

## 2020-09-06 DIAGNOSIS — R1312 Dysphagia, oropharyngeal phase: Secondary | ICD-10-CM | POA: Insufficient documentation

## 2020-09-06 DIAGNOSIS — R1310 Dysphagia, unspecified: Secondary | ICD-10-CM | POA: Insufficient documentation

## 2020-09-06 NOTE — Progress Notes (Signed)
Modified Barium Swallow Progress Note  Patient Details  Name: Jasmine Jenkins MRN: [id number] Date of Birth: 09/06/1925  Today's Date: 09/06/2020  Modified Barium Swallow completed.  Full report located under Chart Review in the Imaging Section.  Brief recommendations include the following:  Clinical Impression  Pt demonstrates adequate oral and orohparyngeal function with thin and solids. No weakness, residue or aspiration. Esophageal sweep showed severe stasis and concern for poor motility. Reiterated esophageal strategies with pt and son. Pt with plans to f/u with primary GI already. No SLP f/u needed.   Swallow Evaluation Recommendations   Recommended Consults: Consider GI evaluation;Consider esophageal assessment   SLP Diet Recommendations: Thin liquid;Regular solids   Liquid Administration via: Cup;Straw   Medication Administration: Whole meds with liquid   Supervision: Patient able to self feed           Oral Care Recommendations: Patient independent with oral care       Harlon Ditty, MA CCC-SLP  Acute Rehabilitation Services Pager [phone] Office [phone]  Claudine Mouton 09/06/2020,12:25 PM

## 2020-09-07 ENCOUNTER — Other Ambulatory Visit: Payer: Self-pay | Admitting: Physician Assistant

## 2020-09-07 DIAGNOSIS — R1310 Dysphagia, unspecified: Secondary | ICD-10-CM

## 2020-09-10 ENCOUNTER — Ambulatory Visit
Admission: RE | Admit: 2020-09-10 | Discharge: 2020-09-10 | Disposition: A | Discharge status: Home / Self Care | Payer: Medicare Other | Source: Ambulatory Visit | Attending: Physician Assistant | Admitting: Physician Assistant

## 2020-09-10 ENCOUNTER — Other Ambulatory Visit: Payer: Self-pay | Admitting: Physician Assistant

## 2020-09-10 DIAGNOSIS — R1310 Dysphagia, unspecified: Secondary | ICD-10-CM

## 2020-09-10 DIAGNOSIS — K222 Esophageal obstruction: Secondary | ICD-10-CM | POA: Diagnosis not present

## 2020-10-12 DIAGNOSIS — H2513 Age-related nuclear cataract, bilateral: Secondary | ICD-10-CM | POA: Diagnosis not present

## 2020-10-12 DIAGNOSIS — H43812 Vitreous degeneration, left eye: Secondary | ICD-10-CM | POA: Diagnosis not present

## 2020-10-12 DIAGNOSIS — H353131 Nonexudative age-related macular degeneration, bilateral, early dry stage: Secondary | ICD-10-CM | POA: Diagnosis not present

## 2020-10-12 DIAGNOSIS — E119 Type 2 diabetes mellitus without complications: Secondary | ICD-10-CM | POA: Diagnosis not present

## 2020-12-28 DIAGNOSIS — I10 Essential (primary) hypertension: Secondary | ICD-10-CM | POA: Diagnosis not present

## 2020-12-28 DIAGNOSIS — M1990 Unspecified osteoarthritis, unspecified site: Secondary | ICD-10-CM | POA: Diagnosis not present

## 2020-12-28 DIAGNOSIS — E1169 Type 2 diabetes mellitus with other specified complication: Secondary | ICD-10-CM | POA: Diagnosis not present

## 2020-12-28 DIAGNOSIS — I701 Atherosclerosis of renal artery: Secondary | ICD-10-CM | POA: Diagnosis not present

## 2021-02-15 DIAGNOSIS — Z7984 Long term (current) use of oral hypoglycemic drugs: Secondary | ICD-10-CM | POA: Diagnosis not present

## 2021-02-15 DIAGNOSIS — E1169 Type 2 diabetes mellitus with other specified complication: Secondary | ICD-10-CM | POA: Diagnosis not present

## 2021-02-15 DIAGNOSIS — I10 Essential (primary) hypertension: Secondary | ICD-10-CM | POA: Diagnosis not present

## 2021-02-15 DIAGNOSIS — K224 Dyskinesia of esophagus: Secondary | ICD-10-CM | POA: Diagnosis not present

## 2021-02-15 DIAGNOSIS — R1312 Dysphagia, oropharyngeal phase: Secondary | ICD-10-CM | POA: Diagnosis not present

## 2021-02-15 DIAGNOSIS — I482 Chronic atrial fibrillation, unspecified: Secondary | ICD-10-CM | POA: Diagnosis not present

## 2021-03-16 DIAGNOSIS — M1990 Unspecified osteoarthritis, unspecified site: Secondary | ICD-10-CM | POA: Diagnosis not present

## 2021-03-16 DIAGNOSIS — I10 Essential (primary) hypertension: Secondary | ICD-10-CM | POA: Diagnosis not present

## 2021-03-16 DIAGNOSIS — I701 Atherosclerosis of renal artery: Secondary | ICD-10-CM | POA: Diagnosis not present

## 2021-03-16 DIAGNOSIS — E1169 Type 2 diabetes mellitus with other specified complication: Secondary | ICD-10-CM | POA: Diagnosis not present

## 2021-06-20 DIAGNOSIS — I482 Chronic atrial fibrillation, unspecified: Secondary | ICD-10-CM | POA: Diagnosis not present

## 2021-06-20 DIAGNOSIS — R609 Edema, unspecified: Secondary | ICD-10-CM | POA: Diagnosis not present

## 2021-06-20 DIAGNOSIS — I10 Essential (primary) hypertension: Secondary | ICD-10-CM | POA: Diagnosis not present

## 2021-06-20 DIAGNOSIS — E1169 Type 2 diabetes mellitus with other specified complication: Secondary | ICD-10-CM | POA: Diagnosis not present

## 2021-10-05 DIAGNOSIS — I482 Chronic atrial fibrillation, unspecified: Secondary | ICD-10-CM | POA: Diagnosis not present

## 2021-10-05 DIAGNOSIS — Z0000 Encounter for general adult medical examination without abnormal findings: Secondary | ICD-10-CM | POA: Diagnosis not present

## 2021-10-05 DIAGNOSIS — Z1389 Encounter for screening for other disorder: Secondary | ICD-10-CM | POA: Diagnosis not present

## 2021-10-05 DIAGNOSIS — I10 Essential (primary) hypertension: Secondary | ICD-10-CM | POA: Diagnosis not present

## 2021-10-05 DIAGNOSIS — R1312 Dysphagia, oropharyngeal phase: Secondary | ICD-10-CM | POA: Diagnosis not present

## 2021-10-05 DIAGNOSIS — E1169 Type 2 diabetes mellitus with other specified complication: Secondary | ICD-10-CM | POA: Diagnosis not present

## 2021-10-05 DIAGNOSIS — K224 Dyskinesia of esophagus: Secondary | ICD-10-CM | POA: Diagnosis not present

## 2021-10-12 DIAGNOSIS — H2512 Age-related nuclear cataract, left eye: Secondary | ICD-10-CM | POA: Diagnosis not present

## 2021-10-12 DIAGNOSIS — H43812 Vitreous degeneration, left eye: Secondary | ICD-10-CM | POA: Diagnosis not present

## 2021-10-12 DIAGNOSIS — E119 Type 2 diabetes mellitus without complications: Secondary | ICD-10-CM | POA: Diagnosis not present

## 2021-10-12 DIAGNOSIS — H353131 Nonexudative age-related macular degeneration, bilateral, early dry stage: Secondary | ICD-10-CM | POA: Diagnosis not present

## 2021-12-30 DIAGNOSIS — H25812 Combined forms of age-related cataract, left eye: Secondary | ICD-10-CM | POA: Diagnosis not present

## 2022-01-09 IMAGING — CR DG FEMUR 2+V*R*
6 series · 6 of 6 positions shown · non-contrast
Comparison: Radiograph 11/10/2019

CLINICAL DATA: Known right hip fracture, surgery planned for
tomorrow

EXAM:
RIGHT FEMUR 2 VIEWS

[x hip ap right]
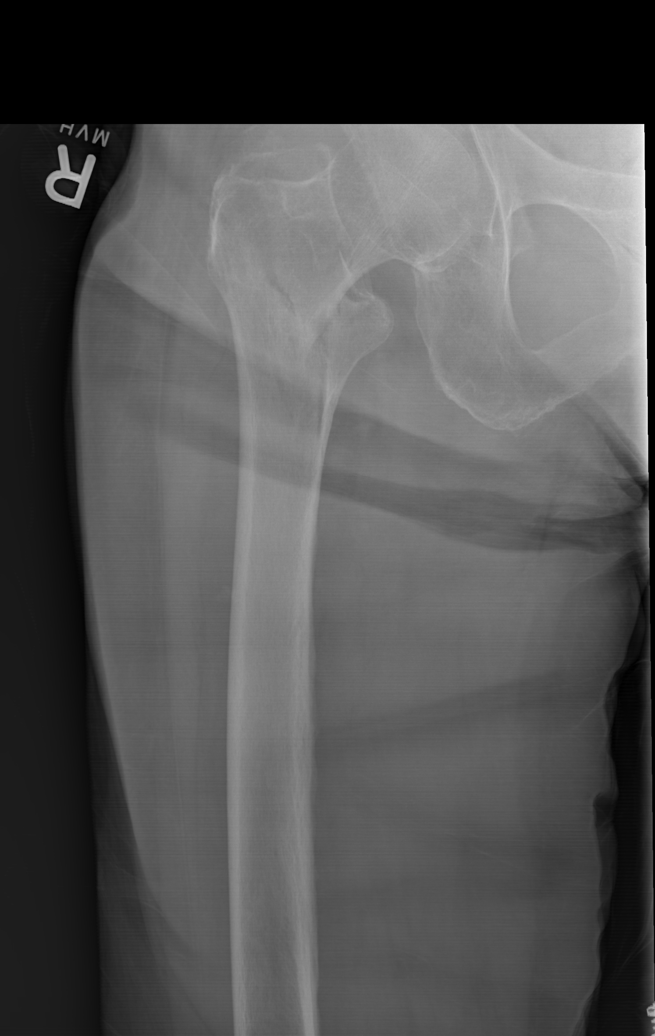

[w hip lat right]
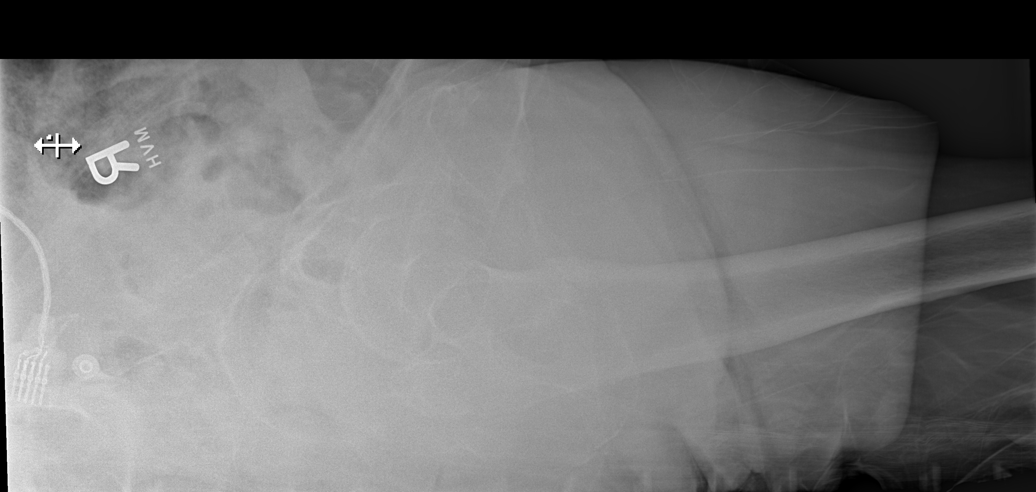

[x femur distal lat right (1 of 4)]
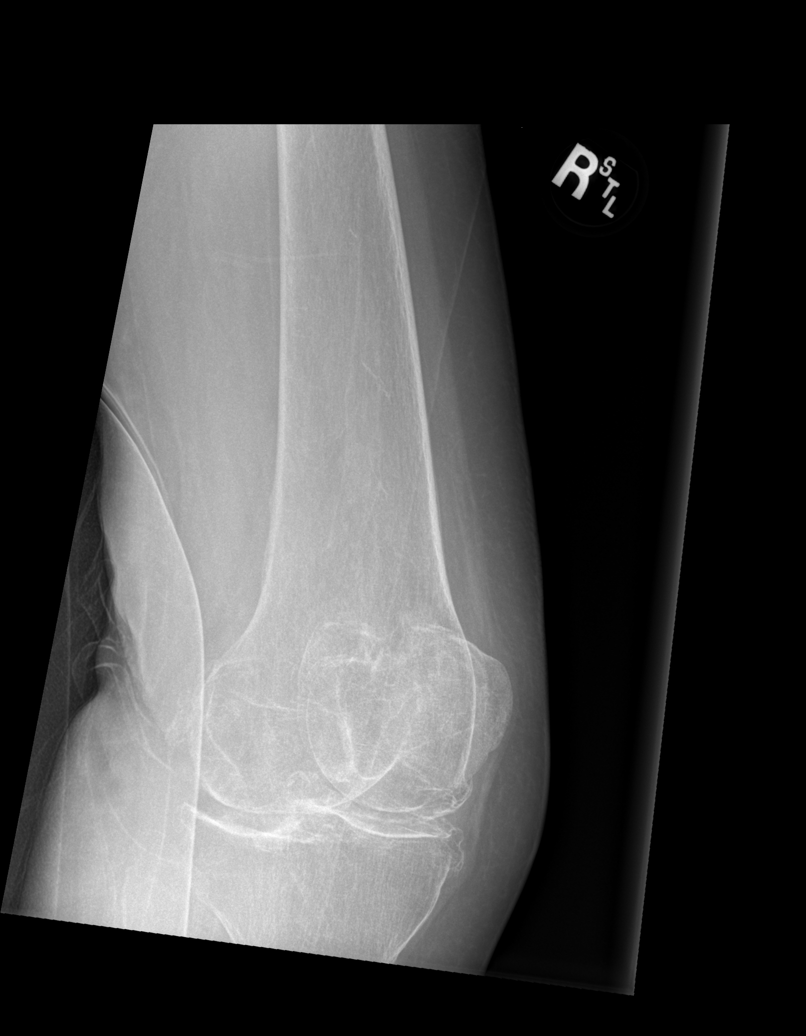

[x femur distal lat right (2 of 4)]
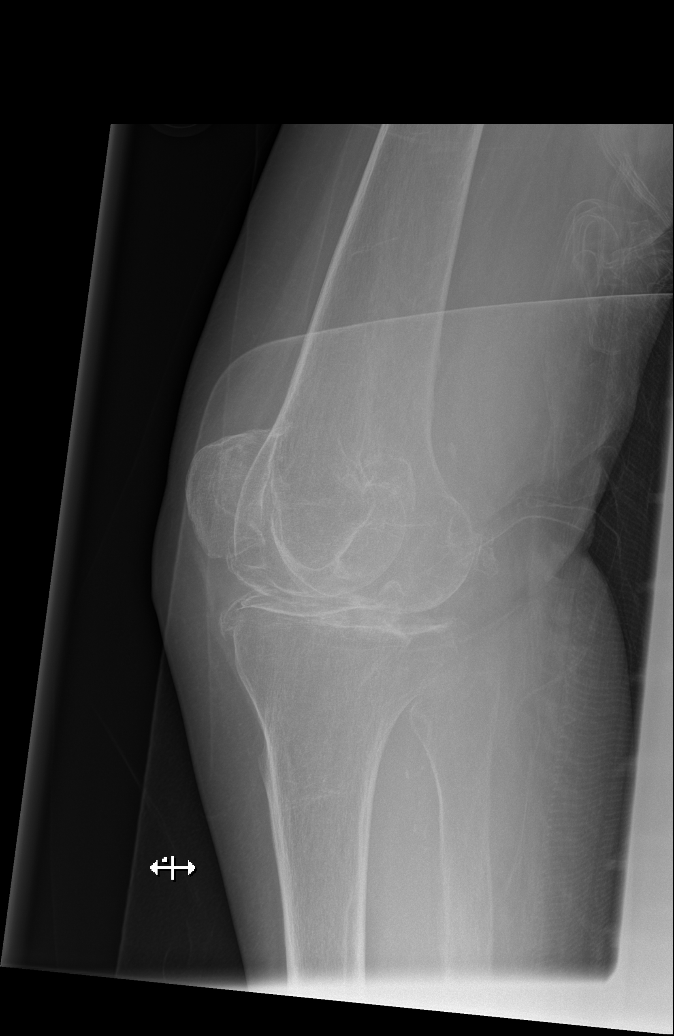

[x femur distal lat right (3 of 4)]
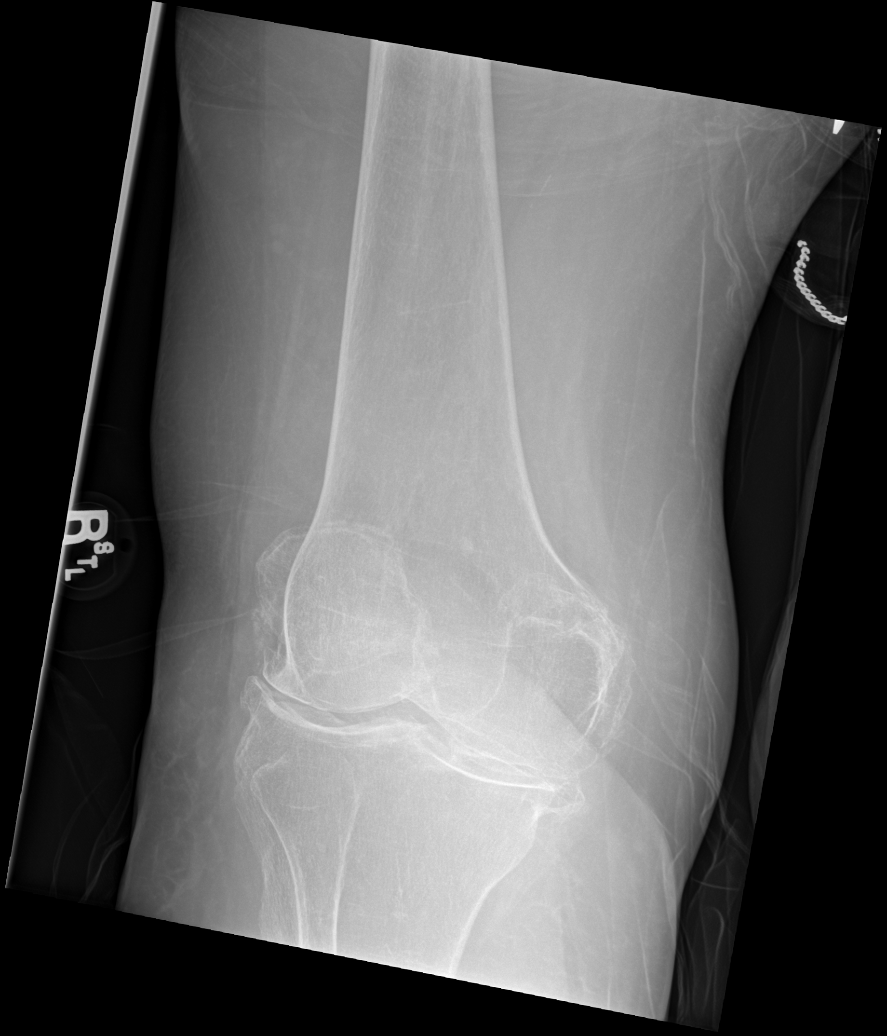

[x femur distal lat right (4 of 4)]
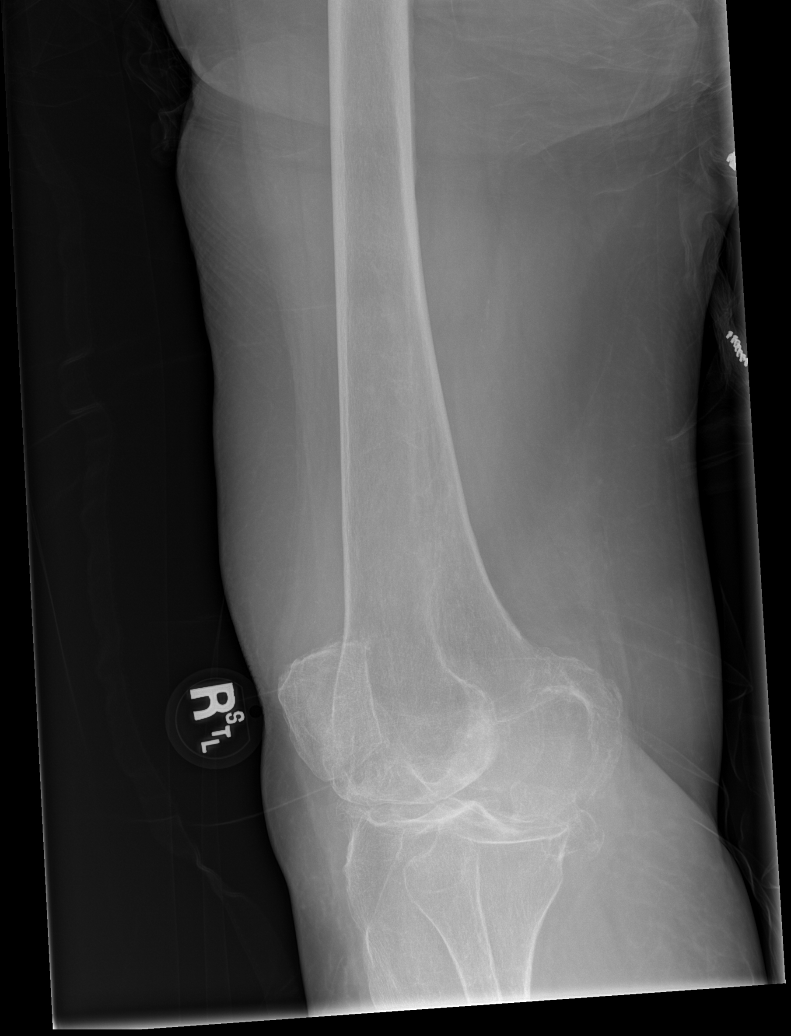

[6 of 6 positions shown; findings below may reference images not displayed]

FINDINGS: There is redemonstration of the varus angulated right
intertrochanteric femur fracture. The femoral head remains normally
located. The distal femur is intact. Alignment at the knee is
grossly preserved albeit with moderate tricompartmental degenerative
changes and meniscal chondrocalcinosis of both medial and lateral
compartments as well as arthrosis with the corticated fabella
posteriorly. Vascular calcium noted at the knee as well.
IMPRESSION: 1. Varus angulated right intertrochanteric femur fracture. No distal
femoral fractures.
2. Moderate tricompartmental degenerative changes in the right knee.

## 2022-01-10 IMAGING — RF DG FEMUR 2+V*R*
1 series · 4 of 4 positions shown · non-contrast
Comparison: 11/10/2019

CLINICAL DATA: Proximal right femur fracture

EXAM:
RIGHT FEMUR 2 VIEWS; DG C-ARM 1-60 MIN

[Series 1: run · 4 of 4 slices shown]
[im 1/4]
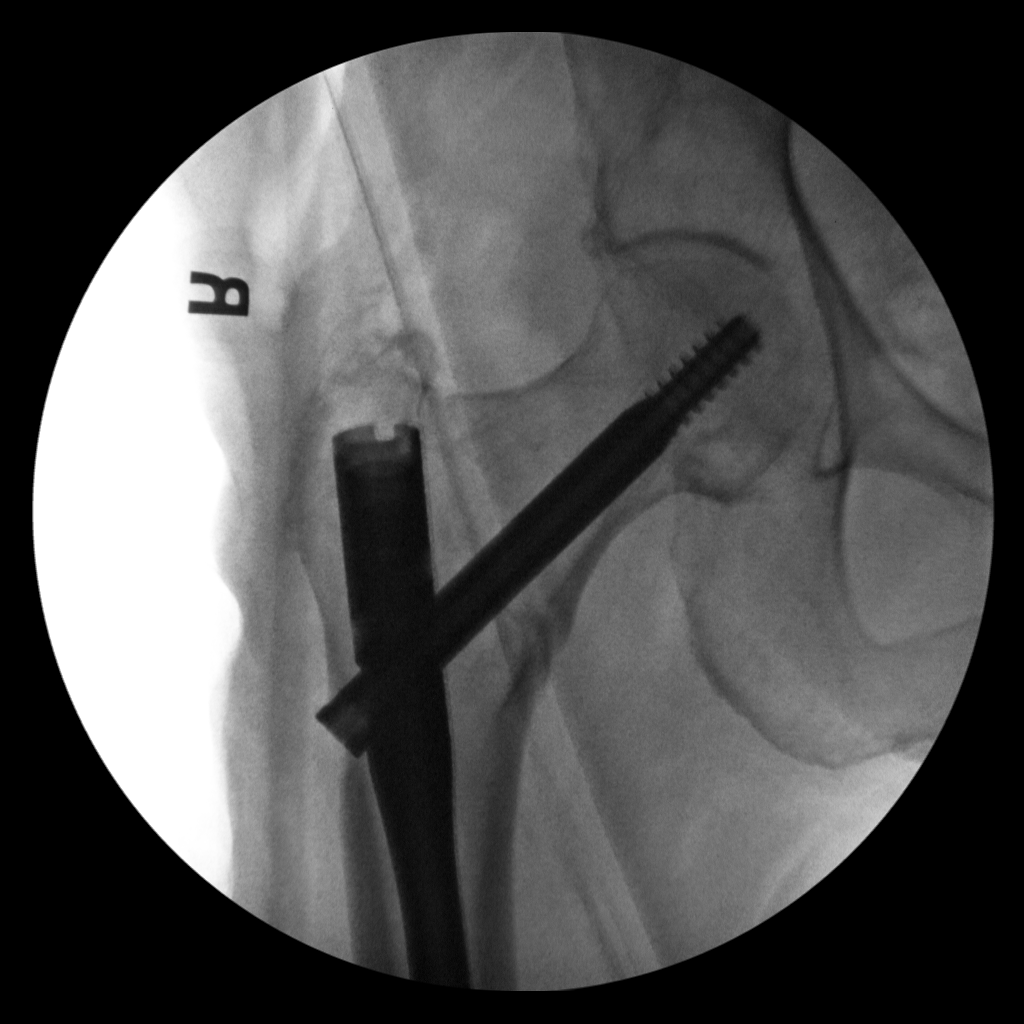
[im 2/4]
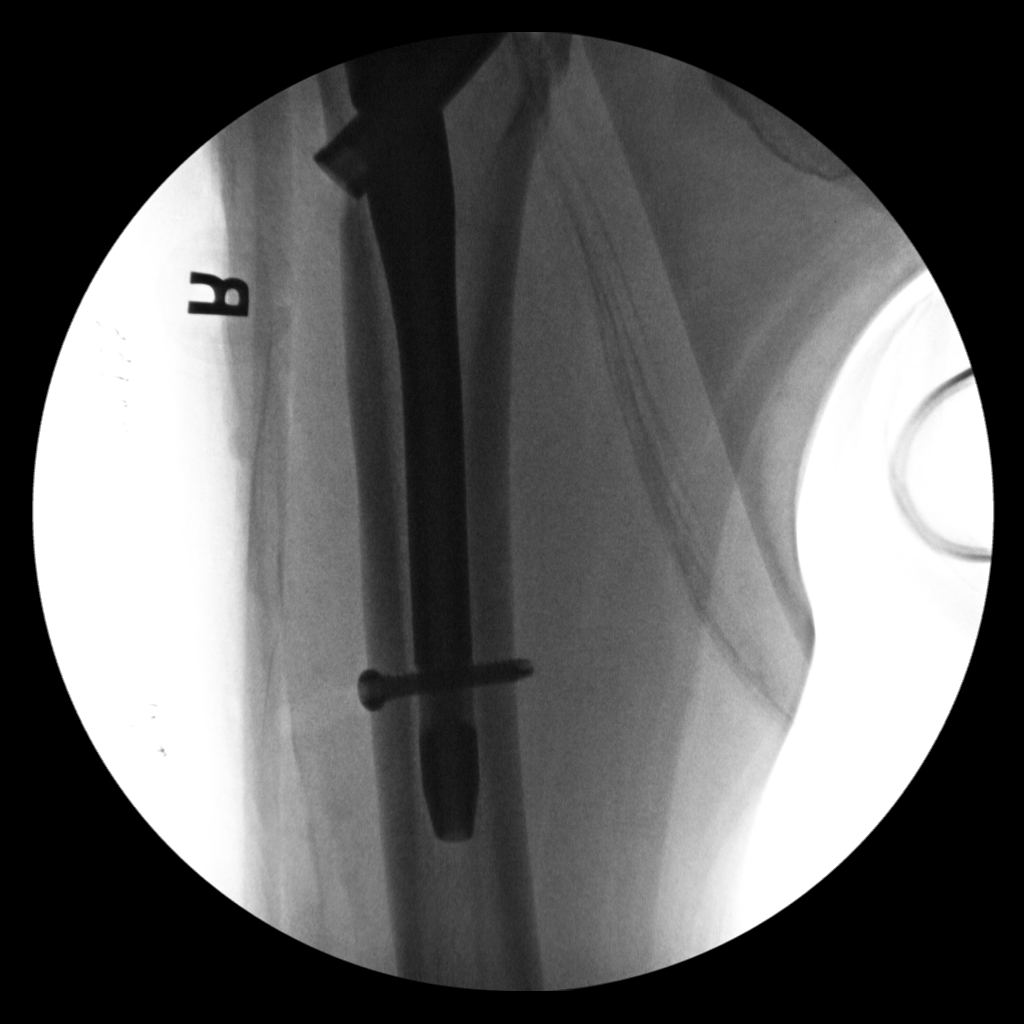
[im 3/4]
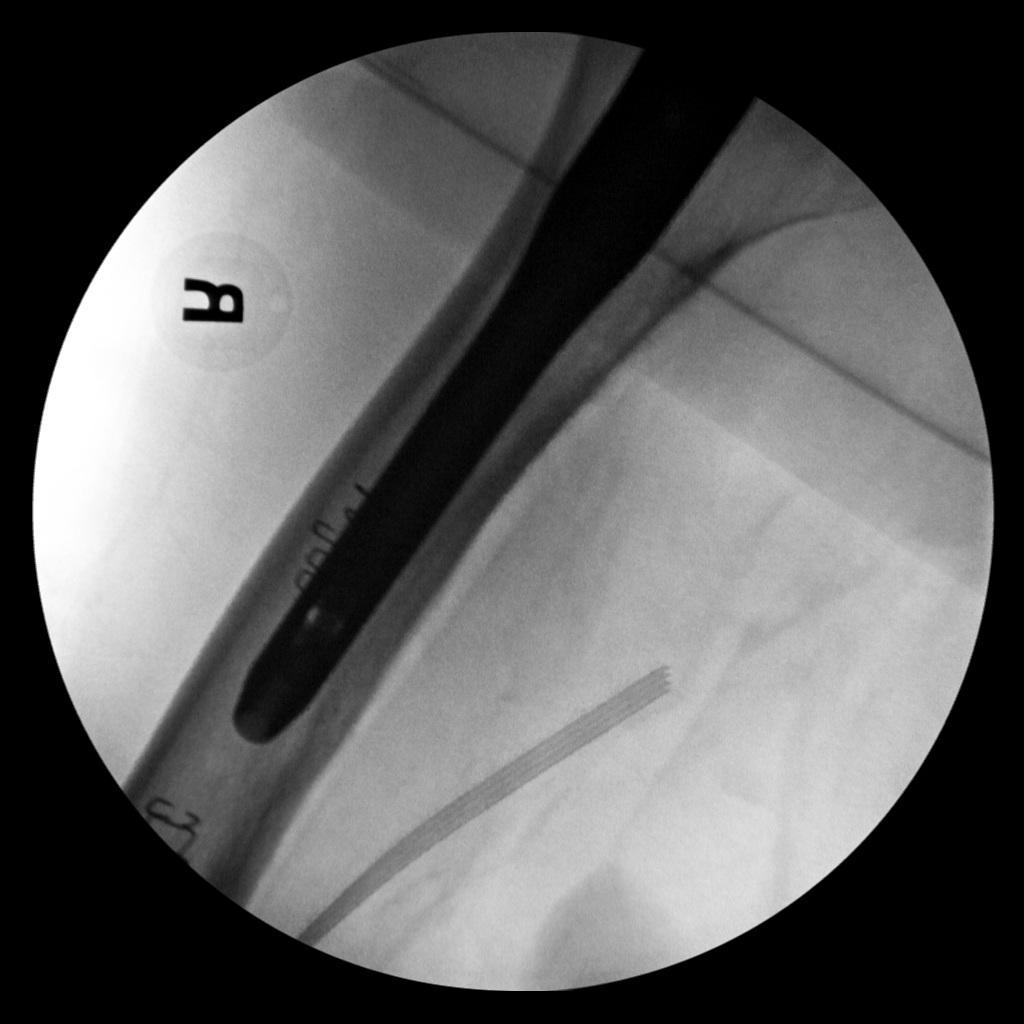
[im 4/4]
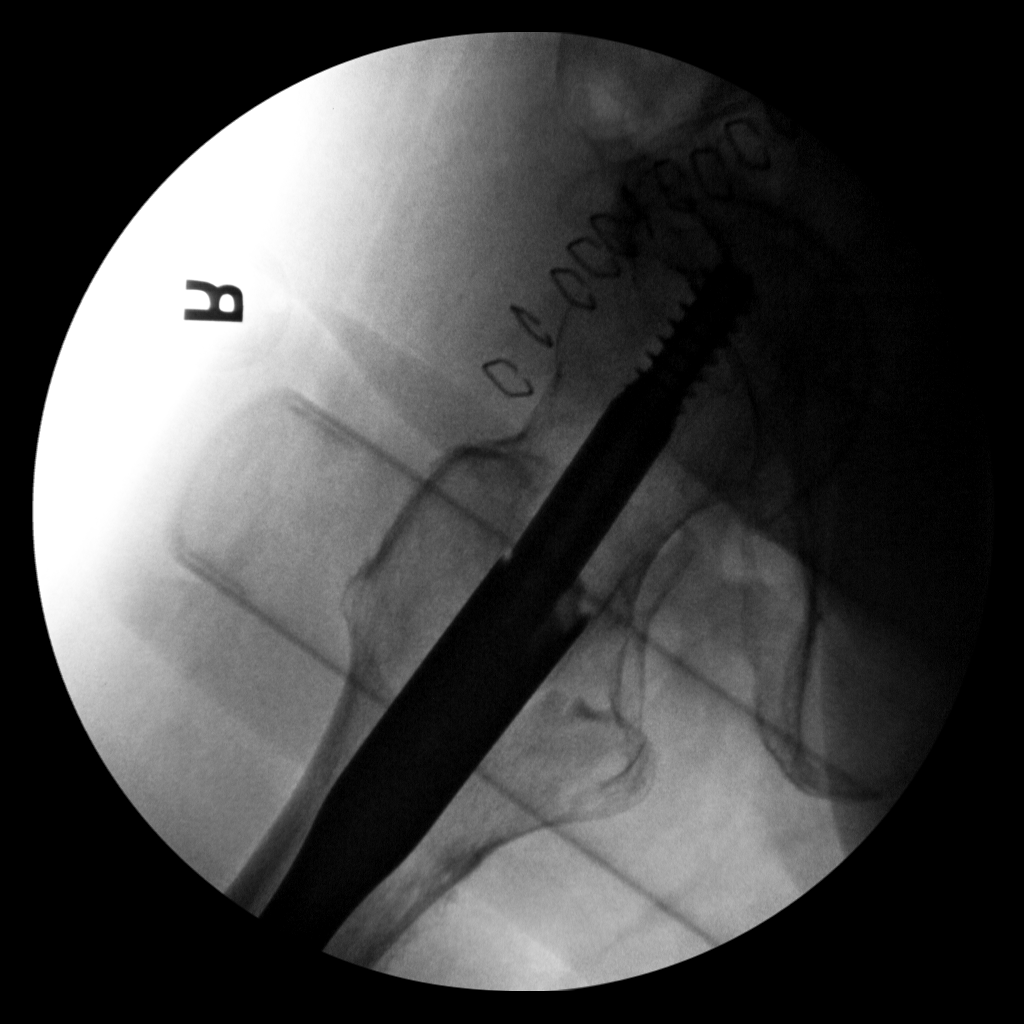

[4 of 4 positions shown; findings below may reference images not displayed]

FLUOROSCOPY TIME:  Radiation Exposure Index (as provided by the
fluoroscopic device): Not available

If the device does not provide the exposure index:

Fluoroscopy Time:  47 seconds

Number of Acquired Images:  4
FINDINGS: Medullary rod and proximal fixation screw is noted. A mid femoral
fixation screws seen as well. Fracture fragments are in near
anatomic alignment.
IMPRESSION: ORIF of proximal right femoral fracture.

## 2022-01-11 DIAGNOSIS — H2511 Age-related nuclear cataract, right eye: Secondary | ICD-10-CM | POA: Diagnosis not present

## 2022-01-13 DIAGNOSIS — H25811 Combined forms of age-related cataract, right eye: Secondary | ICD-10-CM | POA: Diagnosis not present

## 2022-04-06 DIAGNOSIS — K224 Dyskinesia of esophagus: Secondary | ICD-10-CM | POA: Diagnosis not present

## 2022-04-06 DIAGNOSIS — Z7984 Long term (current) use of oral hypoglycemic drugs: Secondary | ICD-10-CM | POA: Diagnosis not present

## 2022-04-06 DIAGNOSIS — I10 Essential (primary) hypertension: Secondary | ICD-10-CM | POA: Diagnosis not present

## 2022-04-06 DIAGNOSIS — E1169 Type 2 diabetes mellitus with other specified complication: Secondary | ICD-10-CM | POA: Diagnosis not present

## 2022-04-06 DIAGNOSIS — E441 Mild protein-calorie malnutrition: Secondary | ICD-10-CM | POA: Diagnosis not present

## 2022-04-11 DIAGNOSIS — L03115 Cellulitis of right lower limb: Secondary | ICD-10-CM | POA: Diagnosis not present

## 2022-04-12 DIAGNOSIS — L03115 Cellulitis of right lower limb: Secondary | ICD-10-CM | POA: Diagnosis not present

## 2022-04-13 ENCOUNTER — Inpatient Hospital Stay (HOSPITAL_COMMUNITY)
Admission: EM | Admit: 2022-04-13 | Discharge: 2022-04-20 | DRG: 871 | Disposition: A | Discharge status: Hospice | Payer: Medicare Other | Attending: Internal Medicine | Admitting: Internal Medicine

## 2022-04-13 ENCOUNTER — Encounter (HOSPITAL_COMMUNITY): Payer: Self-pay | Admitting: Emergency Medicine

## 2022-04-13 ENCOUNTER — Emergency Department (HOSPITAL_COMMUNITY): Payer: Medicare Other

## 2022-04-13 ENCOUNTER — Other Ambulatory Visit: Payer: Self-pay

## 2022-04-13 DIAGNOSIS — M79604 Pain in right leg: Secondary | ICD-10-CM | POA: Diagnosis not present

## 2022-04-13 DIAGNOSIS — R1310 Dysphagia, unspecified: Secondary | ICD-10-CM | POA: Diagnosis not present

## 2022-04-13 DIAGNOSIS — N179 Acute kidney failure, unspecified: Secondary | ICD-10-CM | POA: Diagnosis present

## 2022-04-13 DIAGNOSIS — I4891 Unspecified atrial fibrillation: Secondary | ICD-10-CM | POA: Diagnosis present

## 2022-04-13 DIAGNOSIS — A419 Sepsis, unspecified organism: Secondary | ICD-10-CM | POA: Diagnosis not present

## 2022-04-13 DIAGNOSIS — Z7901 Long term (current) use of anticoagulants: Secondary | ICD-10-CM | POA: Diagnosis not present

## 2022-04-13 DIAGNOSIS — Z87891 Personal history of nicotine dependence: Secondary | ICD-10-CM | POA: Diagnosis not present

## 2022-04-13 DIAGNOSIS — I4819 Other persistent atrial fibrillation: Secondary | ICD-10-CM | POA: Diagnosis present

## 2022-04-13 DIAGNOSIS — R110 Nausea: Secondary | ICD-10-CM | POA: Diagnosis not present

## 2022-04-13 DIAGNOSIS — Z515 Encounter for palliative care: Secondary | ICD-10-CM | POA: Diagnosis not present

## 2022-04-13 DIAGNOSIS — Z7189 Other specified counseling: Secondary | ICD-10-CM | POA: Diagnosis not present

## 2022-04-13 DIAGNOSIS — I272 Pulmonary hypertension, unspecified: Secondary | ICD-10-CM | POA: Diagnosis present

## 2022-04-13 DIAGNOSIS — E119 Type 2 diabetes mellitus without complications: Secondary | ICD-10-CM

## 2022-04-13 DIAGNOSIS — I4821 Permanent atrial fibrillation: Secondary | ICD-10-CM | POA: Diagnosis not present

## 2022-04-13 DIAGNOSIS — M549 Dorsalgia, unspecified: Secondary | ICD-10-CM | POA: Diagnosis present

## 2022-04-13 DIAGNOSIS — E876 Hypokalemia: Secondary | ICD-10-CM | POA: Diagnosis present

## 2022-04-13 DIAGNOSIS — L539 Erythematous condition, unspecified: Secondary | ICD-10-CM | POA: Diagnosis not present

## 2022-04-13 DIAGNOSIS — K219 Gastro-esophageal reflux disease without esophagitis: Secondary | ICD-10-CM | POA: Diagnosis present

## 2022-04-13 DIAGNOSIS — I739 Peripheral vascular disease, unspecified: Secondary | ICD-10-CM | POA: Diagnosis not present

## 2022-04-13 DIAGNOSIS — I2510 Atherosclerotic heart disease of native coronary artery without angina pectoris: Secondary | ICD-10-CM | POA: Diagnosis not present

## 2022-04-13 DIAGNOSIS — Z794 Long term (current) use of insulin: Secondary | ICD-10-CM | POA: Diagnosis not present

## 2022-04-13 DIAGNOSIS — R0902 Hypoxemia: Secondary | ICD-10-CM | POA: Diagnosis not present

## 2022-04-13 DIAGNOSIS — I10 Essential (primary) hypertension: Secondary | ICD-10-CM | POA: Diagnosis present

## 2022-04-13 DIAGNOSIS — Z887 Allergy status to serum and vaccine status: Secondary | ICD-10-CM

## 2022-04-13 DIAGNOSIS — M1990 Unspecified osteoarthritis, unspecified site: Secondary | ICD-10-CM | POA: Diagnosis present

## 2022-04-13 DIAGNOSIS — L0390 Cellulitis, unspecified: Secondary | ICD-10-CM | POA: Diagnosis not present

## 2022-04-13 DIAGNOSIS — R531 Weakness: Secondary | ICD-10-CM | POA: Diagnosis present

## 2022-04-13 DIAGNOSIS — E1151 Type 2 diabetes mellitus with diabetic peripheral angiopathy without gangrene: Secondary | ICD-10-CM | POA: Diagnosis not present

## 2022-04-13 DIAGNOSIS — Z880 Allergy status to penicillin: Secondary | ICD-10-CM

## 2022-04-13 DIAGNOSIS — J9601 Acute respiratory failure with hypoxia: Secondary | ICD-10-CM | POA: Diagnosis not present

## 2022-04-13 DIAGNOSIS — R6520 Severe sepsis without septic shock: Secondary | ICD-10-CM | POA: Diagnosis not present

## 2022-04-13 DIAGNOSIS — M7989 Other specified soft tissue disorders: Secondary | ICD-10-CM

## 2022-04-13 DIAGNOSIS — Z888 Allergy status to other drugs, medicaments and biological substances status: Secondary | ICD-10-CM

## 2022-04-13 DIAGNOSIS — D72828 Other elevated white blood cell count: Secondary | ICD-10-CM | POA: Diagnosis not present

## 2022-04-13 DIAGNOSIS — Z79899 Other long term (current) drug therapy: Secondary | ICD-10-CM

## 2022-04-13 DIAGNOSIS — L03115 Cellulitis of right lower limb: Secondary | ICD-10-CM | POA: Diagnosis present

## 2022-04-13 DIAGNOSIS — Z885 Allergy status to narcotic agent status: Secondary | ICD-10-CM

## 2022-04-13 DIAGNOSIS — Z66 Do not resuscitate: Secondary | ICD-10-CM | POA: Diagnosis not present

## 2022-04-13 DIAGNOSIS — L03116 Cellulitis of left lower limb: Secondary | ICD-10-CM | POA: Diagnosis not present

## 2022-04-13 DIAGNOSIS — Z634 Disappearance and death of family member: Secondary | ICD-10-CM | POA: Diagnosis not present

## 2022-04-13 DIAGNOSIS — R059 Cough, unspecified: Secondary | ICD-10-CM | POA: Diagnosis not present

## 2022-04-13 DIAGNOSIS — G8929 Other chronic pain: Secondary | ICD-10-CM | POA: Diagnosis present

## 2022-04-13 DIAGNOSIS — I701 Atherosclerosis of renal artery: Secondary | ICD-10-CM | POA: Diagnosis not present

## 2022-04-13 DIAGNOSIS — R627 Adult failure to thrive: Secondary | ICD-10-CM | POA: Diagnosis not present

## 2022-04-13 LAB — BASIC METABOLIC PANEL
Anion gap: 17 — ABNORMAL HIGH (ref 5–15)
BUN: 81 mg/dL — ABNORMAL HIGH (ref 8–23)
CO2: 21 mmol/L — ABNORMAL LOW (ref 22–32)
Calcium: 9.9 mg/dL (ref 8.9–10.3)
Chloride: 92 mmol/L — ABNORMAL LOW (ref 98–111)
Creatinine, Ser: 1.81 mg/dL — ABNORMAL HIGH (ref 0.44–1.00)
GFR, Estimated: 26 mL/min — ABNORMAL LOW (ref >60)
Glucose, Bld: 254 mg/dL — ABNORMAL HIGH (ref 70–99)
Potassium: 3.7 mmol/L (ref 3.5–5.1)
Sodium: 130 mmol/L — ABNORMAL LOW (ref 135–145)

## 2022-04-13 LAB — CBC WITH DIFFERENTIAL/PLATELET
Abs Immature Granulocytes: 0.13 10*3/uL — ABNORMAL HIGH (ref 0.00–0.07)
Basophils Absolute: 0.1 10*3/uL (ref 0.0–0.1)
Basophils Relative: 1 %
Eosinophils Absolute: 0.1 10*3/uL (ref 0.0–0.5)
Eosinophils Relative: 0 %
HCT: 42.5 % (ref 36.0–46.0)
Hemoglobin: 13.5 g/dL (ref 12.0–15.0)
Immature Granulocytes: 1 %
Lymphocytes Relative: 2 %
Lymphs Abs: 0.5 10*3/uL — ABNORMAL LOW (ref 0.7–4.0)
MCH: 29.5 pg (ref 26.0–34.0)
MCHC: 31.8 g/dL (ref 30.0–36.0)
MCV: 92.8 fL (ref 80.0–100.0)
Monocytes Absolute: 0.9 10*3/uL (ref 0.1–1.0)
Monocytes Relative: 5 %
Neutro Abs: 19.1 10*3/uL — ABNORMAL HIGH (ref 1.7–7.7)
Neutrophils Relative %: 91 %
Platelets: 186 10*3/uL (ref 150–400)
RBC: 4.58 MIL/uL (ref 3.87–5.11)
RDW: 13.4 % (ref 11.5–15.5)
WBC: 20.8 10*3/uL — ABNORMAL HIGH (ref 4.0–10.5)
nRBC: 0 % (ref 0.0–0.2)

## 2022-04-13 LAB — HEPARIN LEVEL (UNFRACTIONATED): Heparin Unfractionated: 1 IU/mL — ABNORMAL HIGH (ref 0.30–0.70)

## 2022-04-13 LAB — APTT: aPTT: 46 seconds — ABNORMAL HIGH (ref 24–36)

## 2022-04-13 LAB — C-REACTIVE PROTEIN: CRP: 32.9 mg/dL — ABNORMAL HIGH (ref <1.0)

## 2022-04-13 LAB — LACTIC ACID, PLASMA
Lactic Acid, Venous: 1.7 mmol/L (ref 0.5–1.9)
Lactic Acid, Venous: 1.4 mmol/L (ref 0.5–1.9)

## 2022-04-13 LAB — SEDIMENTATION RATE: Sed Rate: 31 mm/hr — ABNORMAL HIGH (ref 0–22)

## 2022-04-13 LAB — CBG MONITORING, ED: Glucose-Capillary: 262 mg/dL — ABNORMAL HIGH (ref 70–99)

## 2022-04-13 MED ORDER — INSULIN ASPART 100 UNIT/ML IJ SOLN
0.0000 [IU] | INTRAMUSCULAR | Status: DC
  Administered 2022-04-13: 5 [IU] via SUBCUTANEOUS
  Administered 2022-04-14 – 2022-04-15 (×2): 2 [IU] via SUBCUTANEOUS
  Administered 2022-04-15: 1 [IU] via SUBCUTANEOUS
  Filled 2022-04-13: qty 0.09

## 2022-04-13 MED ORDER — MORPHINE SULFATE (PF) 4 MG/ML IV SOLN
4.0000 mg | Freq: Once | INTRAVENOUS | Status: AC
  Administered 2022-04-13: 4 mg via INTRAVENOUS
  Filled 2022-04-13: qty 1

## 2022-04-13 MED ORDER — ACETAMINOPHEN 325 MG PO TABS
650.0000 mg | ORAL_TABLET | Freq: Four times a day (QID) | ORAL | Status: DC | PRN
  Administered 2022-04-17 – 2022-04-18 (×2): 650 mg via ORAL
  Filled 2022-04-13 (×2): qty 2

## 2022-04-13 MED ORDER — VANCOMYCIN HCL IN DEXTROSE 1-5 GM/200ML-% IV SOLN
1000.0000 mg | Freq: Once | INTRAVENOUS | Status: AC
  Administered 2022-04-13: 1000 mg via INTRAVENOUS
  Filled 2022-04-13: qty 200

## 2022-04-13 MED ORDER — SODIUM CHLORIDE 0.9 % IV BOLUS
750.0000 mL | Freq: Once | INTRAVENOUS | Status: AC
  Administered 2022-04-13: 750 mL via INTRAVENOUS

## 2022-04-13 MED ORDER — LACTATED RINGERS IV SOLN: INTRAVENOUS | Status: DC

## 2022-04-13 MED ORDER — ONDANSETRON HCL 4 MG/2ML IJ SOLN
4.0000 mg | Freq: Four times a day (QID) | INTRAMUSCULAR | Status: DC | PRN
  Administered 2022-04-14 – 2022-04-15 (×3): 4 mg via INTRAVENOUS
  Filled 2022-04-13 (×3): qty 2

## 2022-04-13 MED ORDER — VANCOMYCIN VARIABLE DOSE PER UNSTABLE RENAL FUNCTION (PHARMACIST DOSING): Status: DC

## 2022-04-13 MED ORDER — ONDANSETRON HCL 4 MG/2ML IJ SOLN
4.0000 mg | Freq: Once | INTRAMUSCULAR | Status: AC
  Administered 2022-04-13: 4 mg via INTRAVENOUS
  Filled 2022-04-13: qty 2

## 2022-04-13 MED ORDER — SODIUM CHLORIDE 0.9 % IV SOLN
1.0000 g | Freq: Once | INTRAVENOUS | Status: AC
  Administered 2022-04-13: 1 g via INTRAVENOUS
  Filled 2022-04-13: qty 10

## 2022-04-13 MED ORDER — HEPARIN (PORCINE) 25000 UT/250ML-% IV SOLN
950.0000 [IU]/h | INTRAVENOUS | Status: DC
  Administered 2022-04-13: 750 [IU]/h via INTRAVENOUS
  Administered 2022-04-15: 950 [IU]/h via INTRAVENOUS
  Filled 2022-04-13 (×2): qty 250

## 2022-04-13 MED ORDER — HYDROMORPHONE HCL 1 MG/ML IJ SOLN
0.5000 mg | INTRAMUSCULAR | Status: DC | PRN
  Administered 2022-04-15: 1 mg via INTRAVENOUS
  Administered 2022-04-15: 0.5 mg via INTRAVENOUS
  Administered 2022-04-16 (×2): 1 mg via INTRAVENOUS
  Filled 2022-04-13 (×4): qty 1

## 2022-04-13 MED ORDER — POLYETHYLENE GLYCOL 3350 17 G PO PACK: 17.0000 g | PACK | Freq: Every day | ORAL | Status: DC | PRN

## 2022-04-13 MED ORDER — SODIUM CHLORIDE 0.9 % IV SOLN
2.0000 g | INTRAVENOUS | Status: DC
  Administered 2022-04-14 – 2022-04-15 (×3): 2 g via INTRAVENOUS
  Filled 2022-04-13 (×3): qty 12.5

## 2022-04-13 MED ORDER — ACETAMINOPHEN 650 MG RE SUPP: 650.0000 mg | Freq: Four times a day (QID) | RECTAL | Status: DC | PRN

## 2022-04-13 MED ORDER — ONDANSETRON HCL 4 MG PO TABS: 4.0000 mg | ORAL_TABLET | Freq: Four times a day (QID) | ORAL | Status: DC | PRN

## 2022-04-13 NOTE — Assessment & Plan Note (Signed)
SSI sensitive scale Q4H for the moment. Hold home amaryl

## 2022-04-13 NOTE — Progress Notes (Signed)
Pharmacy Antibiotic Note  Jasmine Jenkins is a 88 y.o. female who presented to the ED on 04/13/2022 with c/o RLE swelling and redness.  Pharmacy has been consulted to dose vancomycin and cefepime for cellulitis.  - scr 1.81 (crcl~16)  Plan: - cefepime 2gm IV q24h - vancomycin 1000 mg IV x1 given in the ED at 8p. Will f/u with renal function and may check level in 1-2 days to assess clearance to determine the timing for her next dose  ________________________________  Height: 5\' 3"  (160 cm) Weight: 53.5 kg (118 lb) IBW/kg (Calculated) : 52.4  Temp (24hrs), Avg:98.3 F (36.8 C), Min:98.3 F (36.8 C), Max:98.3 F (36.8 C)  Recent Labs  Lab 04/13/22 1728 04/13/22 1840  WBC 20.8*  --   CREATININE 1.81*  --   LATICACIDVEN  --  1.7    Estimated Creatinine Clearance: 15.7 mL/min (A) (by C-G formula based on SCr of 1.81 mg/dL (H)).    Allergies  Allergen Reactions   Fluzone Quadrivalent [Influenza Vac Split Quad] Other (See Comments)    Patient does NOT want a flu shot- stated it caused A-Fib   Penicillins Anaphylaxis and Other (See Comments)    Tolerates ceftriaxone   Codeine Other (See Comments)    GI Intolerance   Coumarin Nausea Only   Fosamax [Alendronate]     Reaction not noted   Hydrocodone-Acetaminophen Other (See Comments)    GI Intolerance   Lotensin [Benazepril] Cough   Norvasc [Amlodipine] Other (See Comments)    Headache      Thank you for allowing pharmacy to be a part of this patient's care.  04/15/22 04/13/2022 9:13 PM

## 2022-04-13 NOTE — H&P (Signed)
History and Physical    Patient: Jasmine Jenkins FTD:[id number] DOB: April 28, 1926 DOA: 04/13/2022 DOS: the patient was seen and examined on 04/13/2022 PCP: Kirby Funk, MD  Patient coming from: Home  Chief Complaint:  Chief Complaint  Patient presents with   Leg Swelling   HPI: Jasmine Jenkins is a 88 y.o. female with medical history significant of DM2, HTN, RAS, strep sepsis.  Patient has had leg pain in the right leg with associated swelling for the past week.  The past 3 days she has gone to the primary doctor and received IM ceftriaxone.  She reports persistent pain and swelling is worsened.  Her primary doctor has also been checking her CBC daily and reports that her white count has increased every day.  No fevers or chills.  Mild nausea without vomiting.  No fainting.  Has been compliant with anticoagulation.  Symptoms constant.  Now in to ED with worsening symptoms and development of bullae on leg.    Review of Systems: As mentioned in the history of present illness. All other systems reviewed and are negative. Past Medical History:  Diagnosis Date   Arthritis    Chronic back pain    Chronic kidney disease    Diabetes (HCC)    Diverticulosis    GERD (gastroesophageal reflux disease)    H/O hiatal hernia    Hypertension    Lower extremity edema    Mild pulmonary hypertension (HCC)    Peripheral vascular disease (HCC)    Persistent atrial fibrillation (HCC)    PONV (postoperative nausea and vomiting)    when she is completely put under   Renal artery stenosis (HCC)    Streptococcal sepsis [facility])    Past Surgical History:  Procedure Laterality Date   BACK SURGERY     BALLOON DILATION N/A 09/27/2017   Procedure: BALLOON DILATION;  Surgeon: Charlott Rakes, MD;  Location: [facility] ENDOSCOPY;  Service: Endoscopy;  Laterality: N/A;   BALLOON DILATION N/A 11/17/2019   Procedure: BALLOON DILATION;  Surgeon: Charlott Rakes, MD;  Location: [facility] ENDOSCOPY;  Service: Endoscopy;   Laterality: N/A;   CARDIAC CATHETERIZATION     CARDIOVERSION  11/29/2011   Procedure: CARDIOVERSION;  Surgeon: Corky Crafts, MD;  Location: [facility] OR;  Service: Cardiovascular;  Laterality: N/A;   CARDIOVERSION N/A 10/02/2012   Procedure: CARDIOVERSION;  Surgeon: Corky Crafts, MD;  Location: [facility] ENDOSCOPY;  Service: Cardiovascular;  Laterality: N/A;   ESOPHAGOGASTRODUODENOSCOPY (EGD) WITH PROPOFOL N/A 08/24/2016   Procedure: ESOPHAGOGASTRODUODENOSCOPY (EGD) WITH PROPOFOL;  Surgeon: Dorena Cookey, MD;  Location: [facility] ENDOSCOPY;  Service: Endoscopy;  Laterality: N/A;   ESOPHAGOGASTRODUODENOSCOPY (EGD) WITH PROPOFOL N/A 09/27/2017   Procedure: ESOPHAGOGASTRODUODENOSCOPY (EGD) WITH PROPOFOL;  Surgeon: Charlott Rakes, MD;  Location: [facility] ENDOSCOPY;  Service: Endoscopy;  Laterality: N/A;   ESOPHAGOGASTRODUODENOSCOPY (EGD) WITH PROPOFOL N/A 11/17/2019   Procedure: ESOPHAGOGASTRODUODENOSCOPY (EGD) WITH PROPOFOL;  Surgeon: Charlott Rakes, MD;  Location: [facility] ENDOSCOPY;  Service: Endoscopy;  Laterality: N/A;   HERNIA REPAIR     INTRAMEDULLARY (IM) NAIL INTERTROCHANTERIC Right 11/11/2019   Procedure: INTRAMEDULLARY (IM) NAIL INTERTROCHANTRIC;  Surgeon: Terance Hart, MD;  Location: [facility] OR;  Service: Orthopedics;  Laterality: Right;   SAVORY DILATION N/A 08/24/2016   Procedure: SAVORY DILATION;  Surgeon: Dorena Cookey, MD;  Location: [facility] ENDOSCOPY;  Service: Endoscopy;  Laterality: N/A;   SAVORY DILATION N/A 09/27/2017   Procedure: SAVORY DILATION;  Surgeon: Charlott Rakes, MD;  Location: [facility] ENDOSCOPY;  Service: Endoscopy;  Laterality: N/A;   Social History:  reports that she quit smoking about 64 years ago. She has never used smokeless tobacco. She reports that she does not drink alcohol and does not use drugs.  Allergies  Allergen Reactions   Fluzone Quadrivalent [Influenza Vac Split Quad] Other (See Comments)    Patient does NOT want a flu shot- stated it caused A-Fib   Penicillins Anaphylaxis  and Other (See Comments)    Tolerates ceftriaxone   Amiodarone     "Multiple side effects"   Codeine Other (See Comments)    GI Intolerance   Coumarin Nausea Only   Fosamax [Alendronate]     Reaction not noted   Hydrocodone-Acetaminophen Other (See Comments)    GI Intolerance   Lotensin [Benazepril] Cough   Metformin And Related Diarrhea and Nausea Only   Norvasc [Amlodipine] Other (See Comments)    Headache     Family History  Problem Relation Age of Onset   COPD Brother     Prior to Admission medications   Medication Sig Start Date End Date Taking? Authorizing Provider  furosemide (LASIX) 40 MG tablet Take 40 mg by mouth daily as needed for fluid or edema.   Yes [provider]  glimepiride (AMARYL) 4 MG tablet Take 4 mg by mouth daily with breakfast.   Yes [provider]  losartan (COZAAR) 50 MG tablet Take 50 mg by mouth daily.   Yes [provider]  metoprolol succinate (TOPROL-XL) 50 MG 24 hr tablet Take 50 mg by mouth in the morning. Take with or immediately following a meal.   Yes [provider]  Multiple Vitamins-Minerals (ICAPS AREDS FORMULA PO) Take 1 capsule by mouth 2 (two) times daily.    Yes [provider]  polyethylene glycol (MIRALAX / GLYCOLAX) 17 g packet Take 17 g by mouth daily. Patient taking differently: Take 17 g by mouth daily as needed for mild constipation. 11/19/19  Yes [place], Alvira Philips, DO  Rivaroxaban (XARELTO) 20 MG TABS Take 20 mg by mouth daily with supper.   Yes [provider]  TYLENOL 8 HOUR ARTHRITIS PAIN 650 MG CR tablet Take 1,300 mg by mouth every 8 (eight) hours as needed for pain.   Yes [provider]  vitamin B-12 (CYANOCOBALAMIN) 1000 MCG tablet Take 1,000 mcg by mouth daily.   Yes [provider]  metoprolol succinate (TOPROL-XL) 25 MG 24 hr tablet Take 3 tablets (75 mg total) by mouth daily. Take with or immediately following a meal. Patient not taking: Reported  on 04/13/2022 11/19/19 04/13/22  [place], Alvira Philips, DO  oxyCODONE (OXY IR/ROXICODONE) 5 MG immediate release tablet Take 1 tablet (5 mg total) by mouth every 6 (six) hours as needed for moderate pain. Patient not taking: Reported on 04/13/2022 11/18/19   [place], Eric J, [place]    Physical Exam: Vitals:   04/13/22 1832 04/13/22 1847 04/13/22 1849 04/13/22 1915  BP: (!) 181/157  (!) 181/157 (!) 185/161  Pulse:   (!) 119 94  Resp: 19  (!) 27 19  Temp:      TempSrc:      SpO2: 98%  100% 98%  Weight:  53.5 kg    Height:  5\' 3"  (1.6 m)     Constitutional: NAD, calm, comfortable Eyes: PERRL, lids and conjunctivae normal ENMT: Mucous membranes are moist. Posterior pharynx clear of any exudate or lesions.Normal dentition.  Neck: normal, supple, no masses, no thyromegaly Respiratory: clear to auscultation bilaterally, no wheezing, no crackles. Normal respiratory effort. No accessory muscle use.  Cardiovascular: IRR, IRR, rate of ~100 Abdomen: no tenderness, no masses palpated. No hepatosplenomegaly. Bowel sounds positive.  Musculoskeletal: no clubbing / cyanosis. No joint deformity upper and lower extremities. Good ROM, no contractures. Normal muscle tone.  Skin:    No crepitus. No pain beyond site of erythema. Very severe edema in R leg especially when compared to L leg.  Neurologic: CN 2-12 grossly intact. Sensation intact, DTR normal. Strength 5/5 in all 4.  Psychiatric: Normal judgment and insight. Alert and oriented x 3. Normal mood.   Data Reviewed:    CBC    Component Value Date/Time   WBC 20.8 (H) 04/13/2022 1728   RBC 4.58 04/13/2022 1728   HGB 13.5 04/13/2022 1728   HCT 42.5 04/13/2022 1728   PLT 186 04/13/2022 1728   MCV 92.8 04/13/2022 1728   MCH 29.5 04/13/2022 1728   MCHC 31.8 04/13/2022 1728   RDW 13.4 04/13/2022 1728   LYMPHSABS 0.5 (L) 04/13/2022 1728   MONOABS 0.9 04/13/2022 1728   EOSABS 0.1 04/13/2022 1728   BASOSABS 0.1 04/13/2022 1728      Latest Ref Rng &  Units 04/13/2022    5:28 PM 11/16/2019    3:04 AM 11/13/2019    5:12 AM  CMP  Glucose 70 - 99 mg/dL 696  789  381   BUN 8 - 23 mg/dL 81  15  33   Creatinine 0.44 - 1.00 mg/dL 0.17  5.10  2.58   Sodium 135 - 145 mmol/L 130  139  135   Potassium 3.5 - 5.1 mmol/L 3.7  4.4  4.3   Chloride 98 - 111 mmol/L 92  105  99   CO2 22 - 32 mmol/L 21  27  28    Calcium 8.9 - 10.3 mg/dL 9.9  8.7  8.8    Venous = no DVT in leg  CT leg and foot = IMPRESSION: Diffuse skin thickening and subcutaneous soft tissue swelling of the right lower leg and foot, as can be seen in cellulitis or lymphedema. Blister-like skin lesions of the distal lower leg. This is asymmetric in comparison to the partially visualized left lower leg. There is no well-defined/drainable fluid collection on noncontrast CT. No acute osseous abnormality. No soft tissue gas.  Assessment and Plan: * Cellulitis of right leg Failed outpt ABx ? Nec fash with the bullae: No sub Q air though on CT. Cellulitis pathway Empiric cefepime + vanc for the moment EDP calling surgery to evaluate for possible necrotizing fasciitis (or at the very least say they dont think she has this). Keeping NPO for the moment, in case they feel OR is needed more emergently. Surg to make decision on wether OR is needed emergently or not.  Sepsis due to cellulitis [facility]) Patient meets criteria for sepsis at time of admission. Specifically the patient has at least 2 out of 4 SIRS criteria, namely: Tachycardia and elevated WBC The currently suspected source of infection is Cellulitis of R leg Lactic acid: 1.7 Blood pressure: 120/54 IVF: 750cc bolus followed by continuous at 100cc/hr Antibiotics: Cefepime + Vanc Cultures pending   AKI (acute kidney injury) (HCC) Likely pre-renal / ATN in setting of cellulitis. Strict intake and output Hold losartan IVF: 750cc bolus then 100 cc/hr Repeat BMP in AM Renal [facility] if not rapidly improving  Atrial fibrillation  (HCC) Hold Xarelto in setting of AKI Use heparin gtt per pharm instead Consider resuming metoprolol for rate control in AM depending on how she does clinically overnight.  Tele monitor  DM2 (diabetes mellitus, type 2) (HCC) SSI sensitive scale Q4H for the moment. Hold home amaryl  HYPERTENSION, BENIGN Hold home losartan due to AKI. Consider resuming metoprolol for rate control in AM depending on how she does clinically overnight.      Advance Care Planning:   Code Status: Full Code  Consults: EDP calling general surgery  Family Communication: Family at bedside  Severity of Illness: The appropriate patient status for this patient is INPATIENT. Inpatient status is judged to be reasonable and necessary in order to provide the required intensity of service to ensure the patient's safety. The patient's presenting symptoms, physical exam findings, and initial radiographic and laboratory data in the context of their chronic comorbidities is felt to place them at high risk for further clinical deterioration. Furthermore, it is not anticipated that the patient will be medically stable for discharge from the hospital within 2 midnights of admission.   * I certify that at the point of admission it is my clinical judgment that the patient will require inpatient hospital care spanning beyond 2 midnights from the point of admission due to high intensity of service, high risk for further deterioration and high frequency of surveillance required.*  Author: Hillary Bow., DO 04/13/2022 9:22 PM  For on call review [email or web address].

## 2022-04-13 NOTE — Assessment & Plan Note (Signed)
Patient meets criteria for sepsis at time of admission. Specifically the patient has at least 2 out of 4 SIRS criteria, namely: Tachycardia and elevated WBC The currently suspected source of infection is Cellulitis of R leg Lactic acid: 1.7 Blood pressure: 120/54 IVF: 750cc bolus followed by continuous at 100cc/hr Antibiotics: Cefepime + Vanc Cultures pending

## 2022-04-13 NOTE — ED Triage Notes (Signed)
Pt here with family sent over by MD office for cellulitis of the right lower leg , lower leg is red swollen and has two blisters on the calf

## 2022-04-13 NOTE — ED Notes (Signed)
Unable to start 2nd IV site, 2 attempts

## 2022-04-13 NOTE — Assessment & Plan Note (Addendum)
Hold home losartan due to AKI. Consider resuming metoprolol for rate control in AM depending on how she does clinically overnight.

## 2022-04-13 NOTE — ED Provider Notes (Signed)
[facility]-EMERGENCY DEPT Provider Note  CSN: [id number] Arrival date & time: 04/13/22 1616  Chief Complaint(s) Leg Swelling  HPI Jasmine Jenkins is a 88 y.o. female with history of diabetes, hypertension presenting to the emergency department with leg pain.  Patient has had leg pain in the right leg with associated swelling for the past week.  The past 3 days she has gone to the primary doctor and received IM ceftriaxone.  She reports persistent pain and swelling is worsened.  Her primary doctor has also been checking her CBC daily and reports that her white count has increased every day.  No fevers or chills.  Mild nausea without vomiting.  No fainting.  Has been compliant with anticoagulation.  Symptoms constant.   Past Medical History Past Medical History:  Diagnosis Date  . Arthritis   . Chronic back pain   . Chronic kidney disease   . Diabetes (HCC)   . Diverticulosis   . GERD (gastroesophageal reflux disease)   . H/O hiatal hernia   . Hypertension   . Lower extremity edema   . Mild pulmonary hypertension (HCC)   . Peripheral vascular disease (HCC)   . Persistent atrial fibrillation (HCC)   . PONV (postoperative nausea and vomiting)    when she is completely put under  . Renal artery stenosis (HCC)   . Streptococcal sepsis [facility](HCC)    Patient Active Problem List   Diagnosis Date Noted  . Dysphagia 08/30/2017  . Esophageal stricture 08/30/2017  . CAD, NATIVE VESSEL 01/04/2010  . CHEST PAIN-PRECORDIAL 05/27/2009  . HYPERTENSION, BENIGN 05/26/2009  . Atrial fibrillation (HCC) 05/26/2009  . DYSPNEA 05/26/2009  . Atherosclerosis of renal artery (HCC) 05/21/2009   Home Medication(s) Prior to Admission medications   Medication Sig Start Date End Date Taking? Authorizing Provider  glimepiride (AMARYL) 2 MG tablet Take 2 mg by mouth daily with breakfast.     [provider]  losartan (COZAAR) 50 MG tablet Take 50 mg by mouth daily.    [provider]  metoprolol succinate (TOPROL-XL) 25 MG 24 hr tablet Take 3 tablets (75 mg total) by mouth daily. Take with or immediately following a meal. 11/19/19 12/19/19  [place], Alvira PhilipsEric J, DO  Multiple Vitamins-Minerals (ICAPS AREDS FORMULA PO) Take 1 capsule by mouth 2 (two) times daily.     [provider]  oxyCODONE (OXY IR/ROXICODONE) 5 MG immediate release tablet Take 1 tablet (5 mg total) by mouth every 6 (six) hours as needed for moderate pain. 11/18/19   [place], Eric J, DO  polyethylene glycol (MIRALAX / GLYCOLAX) 17 g packet Take 17 g by mouth daily. 11/19/19   [place], Alvira PhilipsEric J, DO  Rivaroxaban (XARELTO) 20 MG TABS Take 1 tablet by mouth daily with supper.     [provider]  vitamin B-12 (CYANOCOBALAMIN) 1000 MCG tablet Take 1,000 mcg by mouth daily.    [provider]  Past Surgical History Past Surgical History:  Procedure Laterality Date  . BACK SURGERY    . BALLOON DILATION N/A 09/27/2017   Procedure: BALLOON DILATION;  Surgeon: Charlott Rakes, MD;  Location: [facility] ENDOSCOPY;  Service: Endoscopy;  Laterality: N/A;  . BALLOON DILATION N/A 11/17/2019   Procedure: Marvis Repress DILATION;  Surgeon: Charlott Rakes, MD;  Location: [facility] ENDOSCOPY;  Service: Endoscopy;  Laterality: N/A;  . CARDIAC CATHETERIZATION    . CARDIOVERSION  11/29/2011   Procedure: CARDIOVERSION;  Surgeon: Corky Crafts, MD;  Location: [facility] OR;  Service: Cardiovascular;  Laterality: N/A;  . CARDIOVERSION N/A 10/02/2012   Procedure: CARDIOVERSION;  Surgeon: Corky Crafts, MD;  Location: [facility] ENDOSCOPY;  Service: Cardiovascular;  Laterality: N/A;  . ESOPHAGOGASTRODUODENOSCOPY (EGD) WITH PROPOFOL N/A 08/24/2016   Procedure: ESOPHAGOGASTRODUODENOSCOPY (EGD) WITH PROPOFOL;  Surgeon: Dorena Cookey, MD;  Location[facility] ENDOSCOPY;  Service: Endoscopy;  Laterality: N/A;  .  ESOPHAGOGASTRODUODENOSCOPY (EGD) WITH PROPOFOL N/A 09/27/2017   Procedure: ESOPHAGOGASTRODUODENOSCOPY (EGD) WITH PROPOFOL;  Surgeon: Charlott Rakes, MD;  Location: [facility] ENDOSCOPY;  Service: Endoscopy;  Laterality: N/A;  . ESOPHAGOGASTRODUODENOSCOPY (EGD) WITH PROPOFOL N/A 11/17/2019   Procedure: ESOPHAGOGASTRODUODENOSCOPY (EGD) WITH PROPOFOL;  Surgeon: Charlott Rakes, MD;  Location: [facility] ENDOSCOPY;  Service: Endoscopy;  Laterality: N/A;  . HERNIA REPAIR    . INTRAMEDULLARY (IM) NAIL INTERTROCHANTERIC Right 11/11/2019   Procedure: INTRAMEDULLARY (IM) NAIL INTERTROCHANTRIC;  Surgeon: Terance Hart, MD;  Location: [facility] OR;  Service: Orthopedics;  Laterality: Right;  . SAVORY DILATION N/A 08/24/2016   Procedure: SAVORY DILATION;  Surgeon: Dorena Cookey, MD;  Location: [facility] ENDOSCOPY;  Service: Endoscopy;  Laterality: N/A;  . SAVORY DILATION N/A 09/27/2017   Procedure: SAVORY DILATION;  Surgeon: Charlott Rakes, MD;  Location: [facility] ENDOSCOPY;  Service: Endoscopy;  Laterality: N/A;   Family History Family History  Problem Relation Age of Onset  . COPD Brother     Social History Social History   Tobacco Use  . Smoking status: Former    Types: Cigarettes    Quit date: 08/01/1957    Years since quitting: 64.7  . Smokeless tobacco: Never  Vaping Use  . Vaping Use: Never used  Substance Use Topics  . Alcohol use: No  . Drug use: No   Allergies Penicillins, Codeine, and Hydrocodone-acetaminophen  Review of Systems Review of Systems  All other systems reviewed and are negative.   Physical Exam Vital Signs  I have reviewed the triage vital signs BP (!) 181/157 (BP Location: Left Arm)   Pulse (!) 119   Temp 98.3 F (36.8 C) (Oral)   Resp (!) 27   Ht 5\' 3"  (1.6 m)   Wt 53.5 kg   SpO2 100%   BMI 20.90 kg/m  Physical Exam Vitals and nursing note reviewed.  Constitutional:      General: She is not in acute distress.    Appearance: She is well-developed.  HENT:     Head:  Normocephalic and atraumatic.     Mouth/Throat:     Mouth: Mucous membranes are moist.  Eyes:     Pupils: Pupils are equal, round, and reactive to light.  Cardiovascular:     Rate and Rhythm: Normal rate and regular rhythm.     Heart sounds: No murmur heard. Pulmonary:     Effort: Pulmonary effort is normal. No respiratory distress.     Breath sounds: Normal breath sounds.  Abdominal:     General: Abdomen is flat.     Palpations: Abdomen is soft.  Tenderness: There is no abdominal tenderness.  Musculoskeletal:     Comments: Bilateral lower extremity edema, worse on the right with 1+ pitting edema to the left and 3+ pitting edema to the right lower extremity to the shin.  Bilateral 2+ DP pulses.  Mild erythema and warmth to the right lower extremity.  Able to range ankle and knee without difficulty.  2 serous bulla to the right lower extremity, 1 medially and 1 laterally.  No eschar.  No crepitus.  Skin:    General: Skin is warm and dry.  Neurological:     General: No focal deficit present.     Mental Status: She is alert. Mental status is at baseline.  Psychiatric:        Mood and Affect: Mood normal.        Behavior: Behavior normal.    ED Results and Treatments Labs (all labs ordered are listed, but only abnormal results are displayed) Labs Reviewed  CBC WITH DIFFERENTIAL/PLATELET - Abnormal; Notable for the following components:      Result Value   WBC 20.8 (*)    Neutro Abs 19.1 (*)    Lymphs Abs 0.5 (*)    Abs Immature Granulocytes 0.13 (*)    All other components within normal limits  CULTURE, BLOOD (ROUTINE X 2)  CULTURE, BLOOD (ROUTINE X 2)  BASIC METABOLIC PANEL  LACTIC ACID, PLASMA  LACTIC ACID, PLASMA  SEDIMENTATION RATE  C-REACTIVE PROTEIN                                                                                                                          Radiology No results found.  Pertinent labs & imaging results that were available during my  care of the patient were reviewed by me and considered in my medical decision making (see MDM for details).  Medications Ordered in ED Medications  cefTRIAXone (ROCEPHIN) 1 g in sodium chloride 0.9 % 100 mL IVPB (1 g Intravenous New Bag/Given 04/13/22 1909)  vancomycin (VANCOCIN) IVPB 1000 mg/200 mL premix (has no administration in time range)  morphine (PF) 4 MG/ML injection 4 mg (4 mg Intravenous Given 04/13/22 1909)  ondansetron (ZOFRAN) injection 4 mg (4 mg Intravenous Given 04/13/22 1909)  Procedures Procedures  (including critical care time)  Medical Decision Making / ED Course   MDM:  88 year old female presenting to the emergency department with leg swelling.  On exam, patient has warmth and erythema of the right lower extremity with associated swelling  Exam consistent with cellulitis, with significant soft tissue swelling, lower concern for necrotizing infection given patient is well-appearing, afebrile, no crepitus or eschar.  Patient does have 2 bulla so will obtain CT scan to further evaluate for necrotic infection.  Patient has significant leukocytosis on lab testing with a WBC of almost 21, and has tachycardia and mild tachypnea, concerning for possible sepsis.  Will obtain blood cultures.  Will treat with IV ceftriaxone and vancomycin.  Patient will likely require admission given failure of outpatient treatment with IM ceftriaxone  Clinical Course as of 04/13/22 2101  Thu Apr 13, 2022  2053 No gas on CT scan.  Lower concern for necrotizing infection.  Labs also notable for significant AKI.  Will give IV fluids [WS]    Clinical Course User Index [WS] Lonell Grandchild, MD     Additional history obtained: -Additional history obtained from {wsadditionalhistorian:28072} -External records from outside source obtained and reviewed including:  Chart review including previous notes, labs, imaging, consultation notes   Lab Tests: -I ordered, reviewed, and interpreted labs.   The pertinent results include:   Labs Reviewed  CBC WITH DIFFERENTIAL/PLATELET - Abnormal; Notable for the following components:      Result Value   WBC 20.8 (*)    Neutro Abs 19.1 (*)    Lymphs Abs 0.5 (*)    Abs Immature Granulocytes 0.13 (*)    All other components within normal limits  CULTURE, BLOOD (ROUTINE X 2)  CULTURE, BLOOD (ROUTINE X 2)  BASIC METABOLIC PANEL  LACTIC ACID, PLASMA  LACTIC ACID, PLASMA  SEDIMENTATION RATE  C-REACTIVE PROTEIN      EKG   EKG Interpretation  Date/Time:  Thursday April 13 2022 17:55:16 EDT Ventricular Rate:  130 PR Interval:    QRS Duration: 85 QT Interval:  323 QTC Calculation: 475 R Axis:   73 Text Interpretation: Atrial fibrillation Anterior infarct, old Baseline wander in lead(s) V1 isolated large t wave peaking V2 and inferior ST depression compared to previous. Confirmed by Arby Barrette [phone]) on 04/13/2022 6:06:46 PM         Imaging Studies ordered: I ordered imaging studies including *** On my interpretation imaging demonstrates *** I independently visualized and interpreted imaging. I agree with the radiologist interpretation   Medicines ordered and prescription drug management: Meds ordered this encounter  Medications  . cefTRIAXone (ROCEPHIN) 1 g in sodium chloride 0.9 % 100 mL IVPB    Order Specific Question:   Antibiotic Indication:    Answer:   Cellulitis  . morphine (PF) 4 MG/ML injection 4 mg  . ondansetron (ZOFRAN) injection 4 mg  . vancomycin (VANCOCIN) IVPB 1000 mg/200 mL premix    Order Specific Question:   Indication:    Answer:   Cellulitis    -I have reviewed the patients home medicines and have made adjustments as needed   Consultations Obtained: I requested consultation with the ***,  and discussed lab and imaging findings as well as pertinent plan -  they recommend: ***   Cardiac Monitoring: The patient was maintained on a cardiac monitor.  I personally viewed and interpreted the cardiac monitored which showed an underlying rhythm of: ***  Social Determinants of Health:  Factors impacting patients care  include: {wssoc:28071}   Reevaluation: After the interventions noted above, I reevaluated the patient and found that they have {resolved/improved/worsened:23923::"improved"}  Co morbidities that complicate the patient evaluation . Past Medical History:  Diagnosis Date  . Arthritis   . Chronic back pain   . Chronic kidney disease   . Diabetes (HCC)   . Diverticulosis   . GERD (gastroesophageal reflux disease)   . H/O hiatal hernia   . Hypertension   . Lower extremity edema   . Mild pulmonary hypertension (HCC)   . Peripheral vascular disease (HCC)   . Persistent atrial fibrillation (HCC)   . PONV (postoperative nausea and vomiting)    when she is completely put under  . Renal artery stenosis (HCC)   . Streptococcal sepsis (HCC)       Dispostion: {wsdispo:28070}    Final Clinical Impression(s) / ED Diagnoses Final diagnoses:  None     This chart was dictated using voice recognition software.  Despite best efforts to proofread,  errors can occur which can change the documentation meaning.

## 2022-04-13 NOTE — Progress Notes (Signed)
ANTICOAGULATION CONSULT NOTE   Pharmacy Consult for heparin Indication: hx atrial fibrillation (home xarelto on hold)  Allergies  Allergen Reactions   Fluzone Quadrivalent [Influenza Vac Split Quad] Other (See Comments)    Patient does NOT want a flu shot- stated it caused A-Fib   Penicillins Anaphylaxis and Other (See Comments)    Tolerates ceftriaxone   Amiodarone     "Multiple side effects"   Codeine Other (See Comments)    GI Intolerance   Coumarin Nausea Only   Fosamax [Alendronate]     Reaction not noted   Hydrocodone-Acetaminophen Other (See Comments)    GI Intolerance   Lotensin [Benazepril] Cough   Metformin And Related Diarrhea and Nausea Only   Norvasc [Amlodipine] Other (See Comments)    Headache     Patient Measurements: Height: 5\' 3"  (160 cm) Weight: 53.5 kg (118 lb) IBW/kg (Calculated) : 52.4 Heparin Dosing Weight: 53 kg  Vital Signs: Temp: 98.3 F (36.8 C) (09/14 1700) Temp Source: Oral (09/14 1700) BP: 185/161 (09/14 1915) Pulse Rate: 94 (09/14 1915)  Labs: Recent Labs    04/13/22 1728  HGB 13.5  HCT 42.5  PLT 186  CREATININE 1.81*    Estimated Creatinine Clearance: 15.7 mL/min (A) (by C-G formula based on SCr of 1.81 mg/dL (H)).   Medications:  - on xarelto 20 mg daily PTA (last dose taken on 04/12/22 at 7p)  Assessment: Patient is a 88 y.o F with hx of afib on xarelto PTA who presented to the ED on 04/13/22 with LE cellulitis.  She was found to have AKI.  Pharmacy has been consulted to transition anticoag. to heparin drip.   Goal of Therapy:  Heparin level 0.3-0.7 units/ml aPTT 66-102 seconds Monitor platelets by anticoagulation protocol: Yes   Plan:  - baseline aPTT and heparin level now - start heparin drip at 750 units/hr - check 8 hr heparin level and aPTT - monitor for s/sx bleeding  04/15/22 04/13/2022,9:21 PM

## 2022-04-13 NOTE — Assessment & Plan Note (Addendum)
Failed outpt ABx ? Nec fash with the bullae: No sub Q air though on CT. 1. Cellulitis pathway 2. Empiric cefepime + vanc for the moment 3. EDP calling surgery to evaluate for possible necrotizing fasciitis (or at the very least say they dont think she has this). 1. Keeping NPO for the moment, in case they feel OR is needed more emergently. 2. Surg to make decision on wether OR is needed emergently or not.

## 2022-04-13 NOTE — Progress Notes (Signed)
I was consulted on Ms. Novell by Dr. Julian Reil due to right lower extremity erythema and swelling. I have reviewed images and CT scan. This appears consistent cellulitis. I agree with IV antibiotics (vacomycin and cefepime), NWB of right LE, and close observation. I have asked that edges of erythema be demarcated with a pen, and I will evaluate patient tomorrow to see if symptoms are improving or worsening, although I do feel symptoms are likely to improve with the appropriate antibiotics.

## 2022-04-13 NOTE — Progress Notes (Signed)
Lower extremity venous right study completed.  Preliminary results relayed to [place], MD.   See CV Proc for preliminary results report.   Halston Fairclough, RDMS, RVT  

## 2022-04-13 NOTE — Assessment & Plan Note (Addendum)
Likely pre-renal / ATN in setting of cellulitis. 1. Strict intake and output 2. Hold losartan 3. IVF: 750cc bolus then 100 cc/hr 4. Repeat BMP in AM 5. Renal US if not rapidly improving

## 2022-04-13 NOTE — Assessment & Plan Note (Signed)
1. Hold Xarelto in setting of AKI 1. Use heparin gtt per pharm instead 2. Consider resuming metoprolol for rate control in AM depending on how she does clinically overnight. 3. Tele monitor

## 2022-04-13 NOTE — ED Provider Triage Note (Signed)
Emergency Medicine Provider Triage Evaluation Note  Jasmine Jenkins , a 88 y.o. female  was evaluated in triage.  Pt complains of leg swelling and pain. Right leg. Going on for a week. Saw PCP (Dr. Valentina Lucks) last Thursday. Been receiving rocephin daily since for cellulitis.Taking tramadol. WBC continues to trend up. No fever. No CP or SOB. Nauseas and vomiting. No diarrhea.   H/o esophageal stricture. Afib on thinners (patient stated she took her metoprolol today)  Review of Systems  Positive: See above Negative: See above  Physical Exam  BP (!) 120/54 (BP Location: Left Arm)   Pulse 89   Temp 98.3 F (36.8 C) (Oral)   Resp 16   SpO2 99%  Gen:   Awake, no distress   Resp:  Normal effort  Cardiac: irregular fast rhythm MSK:   Moves extremities without difficulty. Rt lower leg swollen red and oozing serous fluid.  Other:    Medical Decision Making  Medically screening exam initiated at 5:21 PM.  Appropriate orders placed.  Jasmine Jenkins was informed that the remainder of the evaluation will be completed by another provider, this initial triage assessment does not replace that evaluation, and the importance of remaining in the ED until their evaluation is complete.  Plan: u/s RLE, CBC, bmp, ekg   Gareth [facility], PA-C 04/13/22 1731

## 2022-04-13 NOTE — Progress Notes (Signed)
A consult was received from an ED physician for vancomycin per pharmacy dosing.  The patient's profile has been reviewed for ht/wt/allergies/indication/available labs.    A one time order has been placed for vancomycin 1000 mg IV x1 .  Further antibiotics/pharmacy consults should be ordered by admitting physician if indicated.                       Thank you, Lucia Gaskins 04/13/2022  6:46 PM

## 2022-04-14 ENCOUNTER — Other Ambulatory Visit: Payer: Self-pay

## 2022-04-14 DIAGNOSIS — L03115 Cellulitis of right lower limb: Secondary | ICD-10-CM | POA: Diagnosis not present

## 2022-04-14 LAB — COMPREHENSIVE METABOLIC PANEL
ALT: 15 U/L (ref 0–44)
AST: 15 U/L (ref 15–41)
Albumin: 2.4 g/dL — ABNORMAL LOW (ref 3.5–5.0)
Alkaline Phosphatase: 45 U/L (ref 38–126)
Anion gap: 9 (ref 5–15)
BUN: 78 mg/dL — ABNORMAL HIGH (ref 8–23)
CO2: 25 mmol/L (ref 22–32)
Calcium: 9 mg/dL (ref 8.9–10.3)
Chloride: 99 mmol/L (ref 98–111)
Creatinine, Ser: 1.38 mg/dL — ABNORMAL HIGH (ref 0.44–1.00)
GFR, Estimated: 35 mL/min — ABNORMAL LOW (ref >60)
Glucose, Bld: 89 mg/dL (ref 70–99)
Potassium: 3.1 mmol/L — ABNORMAL LOW (ref 3.5–5.1)
Sodium: 133 mmol/L — ABNORMAL LOW (ref 135–145)
Total Bilirubin: 1.1 mg/dL (ref 0.3–1.2)
Total Protein: 5.5 g/dL — ABNORMAL LOW (ref 6.5–8.1)

## 2022-04-14 LAB — HEMOGLOBIN A1C
Hgb A1c MFr Bld: 8.9 % — ABNORMAL HIGH (ref 4.8–5.6)
Mean Plasma Glucose: 208.73 mg/dL

## 2022-04-14 LAB — GLUCOSE, CAPILLARY
Glucose-Capillary: 153 mg/dL — ABNORMAL HIGH (ref 70–99)
Glucose-Capillary: 97 mg/dL (ref 70–99)
Glucose-Capillary: 89 mg/dL (ref 70–99)
Glucose-Capillary: 120 mg/dL — ABNORMAL HIGH (ref 70–99)
Glucose-Capillary: 54 mg/dL — ABNORMAL LOW (ref 70–99)
Glucose-Capillary: 90 mg/dL (ref 70–99)
Glucose-Capillary: 84 mg/dL (ref 70–99)
Glucose-Capillary: 120 mg/dL — ABNORMAL HIGH (ref 70–99)

## 2022-04-14 LAB — CBC
HCT: 35.3 % — ABNORMAL LOW (ref 36.0–46.0)
Hemoglobin: 11.2 g/dL — ABNORMAL LOW (ref 12.0–15.0)
MCH: 29.3 pg (ref 26.0–34.0)
MCHC: 31.7 g/dL (ref 30.0–36.0)
MCV: 92.4 fL (ref 80.0–100.0)
Platelets: 171 10*3/uL (ref 150–400)
RBC: 3.82 MIL/uL — ABNORMAL LOW (ref 3.87–5.11)
RDW: 13.4 % (ref 11.5–15.5)
WBC: 18.7 10*3/uL — ABNORMAL HIGH (ref 4.0–10.5)
nRBC: 0 % (ref 0.0–0.2)

## 2022-04-14 LAB — APTT
aPTT: 54 seconds — ABNORMAL HIGH (ref 24–36)
aPTT: 64 seconds — ABNORMAL HIGH (ref 24–36)

## 2022-04-14 LAB — HEPARIN LEVEL (UNFRACTIONATED): Heparin Unfractionated: 0.65 IU/mL (ref 0.30–0.70)

## 2022-04-14 MED ORDER — METOPROLOL TARTRATE 5 MG/5ML IV SOLN
2.5000 mg | Freq: Once | INTRAVENOUS | Status: AC
  Administered 2022-04-14: 2.5 mg via INTRAVENOUS
  Filled 2022-04-14: qty 5

## 2022-04-14 MED ORDER — METOPROLOL TARTRATE 5 MG/5ML IV SOLN: 2.5000 mg | Freq: Once | INTRAVENOUS | Status: DC

## 2022-04-14 MED ORDER — DEXTROSE 50 % IV SOLN
25.0000 g | INTRAVENOUS | Status: AC
  Administered 2022-04-14: 25 g via INTRAVENOUS

## 2022-04-14 MED ORDER — DEXTROSE 50 % IV SOLN
INTRAVENOUS | Status: AC
  Administered 2022-04-14: 50 mL
  Filled 2022-04-14: qty 50

## 2022-04-14 NOTE — Progress Notes (Signed)
ANTICOAGULATION CONSULT NOTE   Pharmacy Consult for heparin Indication: hx atrial fibrillation (home xarelto on hold)  Allergies  Allergen Reactions   Fluzone Quadrivalent [Influenza Vac Split Quad] Other (See Comments)    Patient does NOT want a flu shot- stated it caused A-Fib   Penicillins Anaphylaxis and Other (See Comments)    Tolerates ceftriaxone   Amiodarone Other (See Comments)    "Multiple side effects"   Codeine Other (See Comments)    GI Intolerance   Coumarin Nausea Only   Fosamax [Alendronate] Other (See Comments)    Reaction not noted- listed as allergic on [facility] paperwork   Hydrocodone-Acetaminophen Other (See Comments)    GI Intolerance   Lotensin [Benazepril] Cough   Metformin And Related Diarrhea and Nausea Only   Norvasc [Amlodipine] Other (See Comments)    Headache     Patient Measurements: Height: 5\' 3"  (160 cm) Weight: 53.5 kg (118 lb) IBW/kg (Calculated) : 52.4 Heparin Dosing Weight: 53 kg  Vital Signs: Temp: 97.6 F (36.4 C) (09/15 1347) Temp Source: Oral (09/15 1347) BP: 101/62 (09/15 1347) Pulse Rate: 73 (09/15 1347)  Labs: Recent Labs    04/13/22 1728 04/13/22 2200 04/14/22 0751 04/14/22 1838  HGB 13.5  --  11.2*  --   HCT 42.5  --  35.3*  --   PLT 186  --  171  --   APTT  --  46* 54* 64*  HEPARINUNFRC  --  1.00* 0.65  --   CREATININE 1.81*  --  1.38*  --      Estimated Creatinine Clearance: 20.6 mL/min (A) (by C-G formula based on SCr of 1.38 mg/dL (H)).   Medications:  - on xarelto 20 mg daily PTA (last dose taken on 04/12/22 at 7p)  Assessment: Patient is a 88 y.o F with hx of afib on xarelto PTA who presented to the ED on 04/13/22 with LE cellulitis.  She was found to have AKI.  Pharmacy has been consulted to transition anticoag to heparin drip.  Today, 04/14/22 aPTT 64, slightly subtherapeutic on heparin infusion at 850 units/hr Hgb 11.2 , plt 171 SCr improving Per RN coming on shift, no bleeding or infusion issues  reported   Goal of Therapy:  Heparin level 0.3-0.7 units/ml aPTT 66-102 seconds Monitor platelets by anticoagulation protocol: Yes   Plan:  -Increase heparin drip to 950 units/hr -Check 8 hr aPTT -Daily CBC and HL -Monitor for s/sx bleeding   04/16/22, PharmD, BCPS Clinical Pharmacist 04/14/2022 7:10 PM

## 2022-04-14 NOTE — Inpatient Diabetes Management (Signed)
Inpatient Diabetes Program Recommendations  AACE/ADA: New Consensus Statement on Inpatient Glycemic Control (2015)  Target Ranges:  Prepandial:   less than 140 mg/dL      Peak postprandial:   less than 180 mg/dL (1-2 hours)      Critically ill patients:  140 - 180 mg/dL   Lab Results  Component Value Date   GLUCAP 89 04/14/2022   HGBA1C 8.9 (H) 04/13/2022    Review of Glycemic Control  Latest Reference Range & Units 04/13/22 21:37 04/14/22 02:05 04/14/22 05:54 04/14/22 07:43  Glucose-Capillary 70 - 99 mg/dL 179 (H)  Novolog 5 units  153 (H)  Novolog 2 units 97 89  (H): Data is abnormally high   Latest Reference Range & Units 04/14/22 07:51  GFR, Estimated >60 mL/min 35 (L)  (L): Data is abnormally low  Diabetes history:  DM2  Outpatient Diabetes medications:  Amaryl 4 mg QD  Current orders for Inpatient glycemic control:  Novolog 0-9 units Q4H  Inpatient Diabetes Program Recommendations:    Novolog 0-6 units Q4H  Will continue to follow while inpatient.  Thank you, Dulce Sellar, MSN, CDCES Diabetes Coordinator Inpatient Diabetes Program [phone] (team pager from 8a-5p)

## 2022-04-14 NOTE — Progress Notes (Signed)
Hypoglycemic Event  CBG: 54  Treatment: D50 25 mL (12.5 gm)  Symptoms: None  Follow-up CBG: Time:12:05pm CBG Result:120  Possible Reasons for Event: Inadequate meal intake and Other: NPO  Comments/MD notified:DR. Lucianne Muss New orders placed    Carmelina Peal

## 2022-04-14 NOTE — Progress Notes (Addendum)
PROGRESS NOTE    Tykesha Culverson [facility]  DGL:[id number] DOB: 1926/07/17 DOA: 04/13/2022 PCP: Kirby Funk, MD   Brief Narrative:  This 88 years old female with PMH significant for type 2 diabetes, hypertension, RAS, history of strep sepsis presented in the ED with complaints of right leg pain and swelling for last 1 week.  Patient has gone to her primary care physician and has been receiving IM ceftriaxone daily,  Patient reports persistent pain and swelling has worsened, PCP has been checking her CBC daily which shows increasing white cell count.  Patient was sent from PCP for admission. Patient is admitted for right leg cellulitis and started on IV antibiotics.  Assessment & Plan:   Principal Problem:   Cellulitis of right leg Active Problems:   AKI (acute kidney injury) (HCC)   Sepsis due to cellulitis (HCC)   Atrial fibrillation (HCC)   HYPERTENSION, BENIGN   DM2 (diabetes mellitus, type 2) (HCC)  Right lower extremity cellulitis: Patient presented with worsening pain,  swelling and redness in the right lower extremity.   Patient failed outpatient antibiotic treatment. CT shows no subcutaneous air, fluid-filled bulla is noted Continue IV antibiotics (vancomycin and cefepime ). Orthopedic consulted for possible necrotizing fasciitis, ruled out. Formal consult pending.  Continue IV antibiotics. Leukocytosis is improving.  Sepsis due to cellulitis: Patient meets sepsis criteria at the time of admission (tachycardia, leukocytosis, lactic acid 1.7.  RLE cellulitis Continue IV hydration Continue IV antibiotics,  follow-up cultures.  Acute kidney injury: > Improving Likely prerenal ATN in the setting of cellulitis Hold losartan, continue IV hydration.  Monitor renal functions Renal functions are improving 1.81> 1.32  Atrial fibrillation: Xarelto is on hold in the setting of AKI.   Continue heparin GTT as per pharmacy. Continue metoprolol for heart rate control.  Diabetes mellitus  type 2 Hold p.o. diabetic medications Continue regular insulin sliding scale  Essential hypertension: Hold losartan due to AKI Resume metoprolol for hypertension.  Hypokalemia: Replaced.  Continue to monitor  DVT prophylaxis: Heparin gtt Code Status: Full code Family Communication: Son at bed side Disposition Plan:   Status is: Inpatient Remains inpatient appropriate because: Admitted for RLE cellulitis,  failed outpatient treatment requiring IV antibiotics. Anticipated discharge in few days.    Consultants:  Orthopeadics  Procedures: CT   Antimicrobials:  Anti-infectives (From admission, onward)    Start     Dose/Rate Route Frequency Ordered Stop   04/13/22 2200  ceFEPIme (MAXIPIME) 2 g in sodium chloride 0.9 % 100 mL IVPB        2 g 200 mL/hr over 30 Minutes Intravenous Every 24 hours 04/13/22 2120     04/13/22 2118  vancomycin variable dose per unstable renal function (pharmacist dosing)         Does not apply See admin instructions 04/13/22 2118     04/13/22 1900  vancomycin (VANCOCIN) IVPB 1000 mg/200 mL premix        1,000 mg 200 mL/hr over 60 Minutes Intravenous  Once 04/13/22 1859 04/13/22 2051   04/13/22 1845  cefTRIAXone (ROCEPHIN) 1 g in sodium chloride 0.9 % 100 mL IVPB        1 g 200 mL/hr over 30 Minutes Intravenous  Once 04/13/22 1841 04/13/22 1939      Subjective: Patient was seen and examined at bedside.  Overnight events noted. Patient reports still having pain but reasonably controlled. She still has significant redness which is improving with antibiotics  Objective: Vitals:   04/13/22 2325 04/14/22 5188  04/14/22 0545 04/14/22 1021  BP: (!) 159/91 94/60 (!) 109/59 (!) 110/52  Pulse: 74 (!) 59 82 70  Resp:  18 18 20   Temp: 97.7 F (36.5 C) 98 F (36.7 C) (!) 97.3 F (36.3 C) (!) 97.5 F (36.4 C)  TempSrc: Oral Oral Oral Oral  SpO2: 98%   93%  Weight:      Height:        Intake/Output Summary (Last 24 hours) at 04/14/2022 1140 Last  data filed at 04/14/2022 0534 Gross per 24 hour  Intake 1457.93 ml  Output 150 ml  Net 1307.93 ml   Filed Weights   04/13/22 1847  Weight: 53.5 kg    Examination:  General exam: Appears comfortable, not in any acute distress.  Deconditioned Respiratory system: CTA bilaterally, respiratory effort normal, RR 15 Cardiovascular system: S1 & S2 heard, irregular rhythm, no murmur. Gastrointestinal system: Abdomen is soft, non tender, non distended, BS+ Central nervous system: Alert and oriented x 3 . No focal neurological deficits. Extremities: RLE: Erythematous, swollen, warm, tender, with fluid-filled bullas noted Skin: No rashes, lesions or ulcers Psychiatry: Judgement and insight appear normal. Mood & affect appropriate.     Data Reviewed: I have personally reviewed following labs and imaging studies  CBC: Recent Labs  Lab 04/13/22 1728 04/14/22 0751  WBC 20.8* 18.7*  NEUTROABS 19.1*  --   HGB 13.5 11.2*  HCT 42.5 35.3*  MCV 92.8 92.4  PLT 186 171   Basic Metabolic Panel: Recent Labs  Lab 04/13/22 1728 04/14/22 0751  NA 130* 133*  K 3.7 3.1*  CL 92* 99  CO2 21* 25  GLUCOSE 254* 89  BUN 81* 78*  CREATININE 1.81* 1.38*  CALCIUM 9.9 9.0   GFR: Estimated Creatinine Clearance: 20.6 mL/min (A) (by C-G formula based on SCr of 1.38 mg/dL (H)). Liver Function Tests: Recent Labs  Lab 04/14/22 0751  AST 15  ALT 15  ALKPHOS 45  BILITOT 1.1  PROT 5.5*  ALBUMIN 2.4*   No results for input(s): "LIPASE", "AMYLASE" in the last 168 hours. No results for input(s): "AMMONIA" in the last 168 hours. Coagulation Profile: No results for input(s): "INR", "PROTIME" in the last 168 hours. Cardiac Enzymes: No results for input(s): "CKTOTAL", "CKMB", "CKMBINDEX", "TROPONINI" in the last 168 hours. BNP (last 3 results) No results for input(s): "PROBNP" in the last 8760 hours. HbA1C: Recent Labs    04/13/22 1706  HGBA1C 8.9*   CBG: Recent Labs  Lab 04/13/22 2137  04/14/22 0205 04/14/22 0554 04/14/22 0743 04/14/22 1134  GLUCAP 262* 153* 97 89 54*   Lipid Profile: No results for input(s): "CHOL", "HDL", "LDLCALC", "TRIG", "CHOLHDL", "LDLDIRECT" in the last 72 hours. Thyroid Function Tests: No results for input(s): "TSH", "T4TOTAL", "FREET4", "T3FREE", "THYROIDAB" in the last 72 hours. Anemia Panel: No results for input(s): "VITAMINB12", "FOLATE", "FERRITIN", "TIBC", "IRON", "RETICCTPCT" in the last 72 hours. Sepsis Labs: Recent Labs  Lab 04/13/22 1840 04/13/22 2040  LATICACIDVEN 1.7 1.4    Recent Results (from the past 240 hour(s))  Culture, blood (routine x 2)     Status: None (Preliminary result)   Collection Time: 04/13/22  6:59 PM   Specimen: BLOOD  Result Value Ref Range Status   Specimen Description   Final    BLOOD RIGHT ANTECUBITAL Performed at [facility], [address], [place], [place] [address]    Special Requests   Final    BOTTLES DRAWN AEROBIC AND ANAEROBIC Blood Culture results may not be  optimal due to an inadequate volume of blood received in culture bottles Performed at [facility], [address] [place], [place] [address]    Culture   Final    NO GROWTH < 12 HOURS Performed at [facility], [address], [place], [place] [address]    Report Status PENDING  Incomplete  Culture, blood (routine x 2)     Status: None (Preliminary result)   Collection Time: 04/13/22  6:59 PM   Specimen: BLOOD  Result Value Ref Range Status   Specimen Description   Final    BLOOD LEFT ANTECUBITAL Performed at [facility], [address], [place], [place] [address]    Special Requests   Final    BOTTLES DRAWN AEROBIC ONLY Blood Culture results may not be optimal due to an inadequate volume of blood received in culture bottles Performed at [facility], [address], [place], [place] [address]    Culture   Final    NO GROWTH < 12  HOURS Performed at [facility], [address], [place], [place] [address]    Report Status PENDING  Incomplete    Radiology Studies: VAS [place] LOWER EXTREMITY VENOUS (DVT) (7a-7p)  Result Date: 04/13/2022  Lower Venous DVT Study Patient Name:  KALISTA CUGINI  Date of Exam:   04/13/2022 Medical Rec #: [id number]        Accession #:    [id number] Date of Birth: 01/28/1926        Patient Gender: F Patient Age:   32 years Exam Location:  [facility] Procedure:      VAS [place] LOWER EXTREMITY VENOUS (DVT) Referring Phys: Riki Sheer --------------------------------------------------------------------------------  Indications: Erythema, Swelling, and Pain. Other Indications: Patient being treated for cellulitis. Limitations: Poor ultrasound/tissue interface and swelling and skin changes. Comparison Study: No prior studies. Performing Technologist: Jean Rosenthal RDMS, RVT  Examination Guidelines: A complete evaluation includes B-mode imaging, spectral Doppler, color Doppler, and power Doppler as needed of all accessible portions of each vessel. Bilateral testing is considered an integral part of a complete examination. Limited examinations for reoccurring indications may be performed as noted. The reflux portion of the exam is performed with the patient in reverse Trendelenburg.  +---------+---------------+---------+-----------+----------+---------------+ RIGHT    CompressibilityPhasicitySpontaneityPropertiesThrombus Aging  +---------+---------------+---------+-----------+----------+---------------+ CFV      Full           Yes      Yes                                  +---------+---------------+---------+-----------+----------+---------------+ SFJ      Full                                                         +---------+---------------+---------+-----------+----------+---------------+ FV Prox  Full                                                          +---------+---------------+---------+-----------+----------+---------------+ FV Mid   Full                                                         +---------+---------------+---------+-----------+----------+---------------+  FV DistalFull                                                         +---------+---------------+---------+-----------+----------+---------------+ PFV      Full                                                         +---------+---------------+---------+-----------+----------+---------------+ POP      Full           Yes      Yes                                  +---------+---------------+---------+-----------+----------+---------------+ PTV                     Yes      Yes                  Patent by color +---------+---------------+---------+-----------+----------+---------------+ PERO                    Yes      Yes                  Patent by color +---------+---------------+---------+-----------+----------+---------------+   +----+---------------+---------+-----------+----------+--------------+ LEFTCompressibilityPhasicitySpontaneityPropertiesThrombus Aging +----+---------------+---------+-----------+----------+--------------+ CFV Full           Yes      Yes                                 +----+---------------+---------+-----------+----------+--------------+     Summary: RIGHT: - There is no evidence of deep vein thrombosis in the lower extremity. However, portions of this examination were limited- see technologist comments above.  - No cystic structure found in the popliteal fossa.  LEFT: - No evidence of common femoral vein obstruction.  *See table(s) above for measurements and observations. Electronically signed by Coral Else MD on 04/13/2022 at 11:54:55 PM.    Final    CT TIBIA FIBULA RIGHT WO CONTRAST  Result Date: 04/13/2022 CLINICAL DATA:  Soft tissue infection suspected, lower leg, xray done EXAM: CT OF THE LOWER RIGHT  EXTREMITY WITHOUT CONTRAST TECHNIQUE: Multidetector CT imaging of the right lower extremity was performed according to the standard protocol. RADIATION DOSE REDUCTION: This exam was performed according to the departmental dose-optimization program which includes automated exposure control, adjustment of the mA and/or kV according to patient size and/or use of iterative reconstruction technique. COMPARISON:  None Available. FINDINGS: Bones/Joint/Cartilage There is no evidence of acute fracture. There is no bony erosion or frank bony destruction. Diffuse osteopenia. Mild-to-moderate osteoarthritis in the midfoot and forefoot. Moderate tibiotalar osteoarthritis. There is severe tricompartment osteoarthritis of the right knee with chondrocalcinosis. Ligaments Suboptimally assessed by CT. Muscles and Tendons There is no intramuscular collection. There is atrophy in the anterior and posterior compartments of the lower leg. Soft tissues There is diffuse skin thickening and subcutaneous soft tissue swelling of the right lower leg and foot with blister like skin lesions along the lower leg. This is asymmetric in comparison to the left  lower extremity, which is partially visualized. There is no well-defined/drainable collection (other than the blister-like skin lesions), and noncontrast CT. There is no evidence of soft tissue gas. IMPRESSION: Diffuse skin thickening and subcutaneous soft tissue swelling of the right lower leg and foot, as can be seen in cellulitis or lymphedema. Blister-like skin lesions of the distal lower leg. This is asymmetric in comparison to the partially visualized left lower leg. There is no well-defined/drainable fluid collection on noncontrast CT. No acute osseous abnormality. No soft tissue gas. Electronically Signed   By: Caprice Renshaw M.D.   On: 04/13/2022 20:25   CT FOOT RIGHT WO CONTRAST  Result Date: 04/13/2022 CLINICAL DATA:  Soft tissue infection suspected, lower leg, xray done EXAM: CT OF  THE LOWER RIGHT EXTREMITY WITHOUT CONTRAST TECHNIQUE: Multidetector CT imaging of the right lower extremity was performed according to the standard protocol. RADIATION DOSE REDUCTION: This exam was performed according to the departmental dose-optimization program which includes automated exposure control, adjustment of the mA and/or kV according to patient size and/or use of iterative reconstruction technique. COMPARISON:  None Available. FINDINGS: Bones/Joint/Cartilage There is no evidence of acute fracture. There is no bony erosion or frank bony destruction. Diffuse osteopenia. Mild-to-moderate osteoarthritis in the midfoot and forefoot. Moderate tibiotalar osteoarthritis. There is severe tricompartment osteoarthritis of the right knee with chondrocalcinosis. Ligaments Suboptimally assessed by CT. Muscles and Tendons There is no intramuscular collection. There is atrophy in the anterior and posterior compartments of the lower leg. Soft tissues There is diffuse skin thickening and subcutaneous soft tissue swelling of the right lower leg and foot with blister like skin lesions along the lower leg. This is asymmetric in comparison to the left lower extremity, which is partially visualized. There is no well-defined/drainable collection (other than the blister-like skin lesions), and noncontrast CT. There is no evidence of soft tissue gas. IMPRESSION: Diffuse skin thickening and subcutaneous soft tissue swelling of the right lower leg and foot, as can be seen in cellulitis or lymphedema. Blister-like skin lesions of the distal lower leg. This is asymmetric in comparison to the partially visualized left lower leg. There is no well-defined/drainable fluid collection on noncontrast CT. No acute osseous abnormality. No soft tissue gas. Electronically Signed   By: Caprice Renshaw M.D.   On: 04/13/2022 20:25    Scheduled Meds:  dextrose  25 g Intravenous STAT   insulin aspart  0-9 Units Subcutaneous Q4H   vancomycin  variable dose per unstable renal function (pharmacist dosing)   Does not apply See admin instructions   Continuous Infusions:  ceFEPime (MAXIPIME) IV Stopped (04/14/22 0206)   heparin 850 Units/hr (04/14/22 1034)   lactated ringers 100 mL/hr at 04/14/22 0135     LOS: 1 day    Time spent: 50 mins    Zali Kamaka, MD [facility]   If 7PM-7AM, please contact night-coverage

## 2022-04-14 NOTE — Consult Note (Signed)
Reason for Consult: right leg redness and swelling Referring Physician: Dr. Sherlean Foot Jasmine Jenkins is an 88 y.o. female.  HPI: Patient was admitted last night for right lower extremity redness and swelling, in addition to other medical issues as outlined in the patient's chart.  As documented, I was consulted for the concern of necrotizing fasciitis.  The patient has been on IV antibiotics now for approximately 12 hours, per my recommendation.  Her redness and swelling has improved dramatically.  Her pain is minimal at this point in time. Her minimal pain she describes as an aching sensation.  Past Medical History:  Diagnosis Date   Arthritis    Chronic back pain    Chronic kidney disease    Diabetes (HCC)    Diverticulosis    GERD (gastroesophageal reflux disease)    H/O hiatal hernia    Hypertension    Lower extremity edema    Mild pulmonary hypertension (HCC)    Peripheral vascular disease (HCC)    Persistent atrial fibrillation (HCC)    PONV (postoperative nausea and vomiting)    when she is completely put under   Renal artery stenosis (HCC)    Streptococcal sepsis [facility])     Past Surgical History:  Procedure Laterality Date   BACK SURGERY     BALLOON DILATION N/A 09/27/2017   Procedure: BALLOON DILATION;  Surgeon: Charlott Rakes, MD;  Location: [facility] ENDOSCOPY;  Service: Endoscopy;  Laterality: N/A;   BALLOON DILATION N/A 11/17/2019   Procedure: BALLOON DILATION;  Surgeon: Charlott Rakes, MD;  Location: [facility] ENDOSCOPY;  Service: Endoscopy;  Laterality: N/A;   CARDIAC CATHETERIZATION     CARDIOVERSION  11/29/2011   Procedure: CARDIOVERSION;  Surgeon: Corky Crafts, MD;  Location: [facility] OR;  Service: Cardiovascular;  Laterality: N/A;   CARDIOVERSION N/A 10/02/2012   Procedure: CARDIOVERSION;  Surgeon: Corky Crafts, MD;  Location: [facility] ENDOSCOPY;  Service: Cardiovascular;  Laterality: N/A;   ESOPHAGOGASTRODUODENOSCOPY (EGD) WITH PROPOFOL N/A 08/24/2016   Procedure:  ESOPHAGOGASTRODUODENOSCOPY (EGD) WITH PROPOFOL;  Surgeon: Dorena Cookey, MD;  Location: [facility] ENDOSCOPY;  Service: Endoscopy;  Laterality: N/A;   ESOPHAGOGASTRODUODENOSCOPY (EGD) WITH PROPOFOL N/A 09/27/2017   Procedure: ESOPHAGOGASTRODUODENOSCOPY (EGD) WITH PROPOFOL;  Surgeon: Charlott Rakes, MD;  Location: [facility] ENDOSCOPY;  Service: Endoscopy;  Laterality: N/A;   ESOPHAGOGASTRODUODENOSCOPY (EGD) WITH PROPOFOL N/A 11/17/2019   Procedure: ESOPHAGOGASTRODUODENOSCOPY (EGD) WITH PROPOFOL;  Surgeon: Charlott Rakes, MD;  Location: [facility] ENDOSCOPY;  Service: Endoscopy;  Laterality: N/A;   HERNIA REPAIR     INTRAMEDULLARY (IM) NAIL INTERTROCHANTERIC Right 11/11/2019   Procedure: INTRAMEDULLARY (IM) NAIL INTERTROCHANTRIC;  Surgeon: Terance Hart, MD;  Location: [facility] OR;  Service: Orthopedics;  Laterality: Right;   SAVORY DILATION N/A 08/24/2016   Procedure: SAVORY DILATION;  Surgeon: Dorena Cookey, MD;  Location: [facility] ENDOSCOPY;  Service: Endoscopy;  Laterality: N/A;   SAVORY DILATION N/A 09/27/2017   Procedure: SAVORY DILATION;  Surgeon: Charlott Rakes, MD;  Location: [facility] ENDOSCOPY;  Service: Endoscopy;  Laterality: N/A;    Family History  Problem Relation Age of Onset   COPD Brother     Social History:  reports that she quit smoking about 64 years ago. She has never used smokeless tobacco. She reports that she does not drink alcohol and does not use drugs.  Allergies:  Allergies  Allergen Reactions   Fluzone Quadrivalent [Influenza Vac Split Quad] Other (See Comments)    Patient does NOT want a flu shot- stated it caused A-Fib   Penicillins Anaphylaxis and Other (See  Comments)    Tolerates ceftriaxone   Amiodarone Other (See Comments)    "Multiple side effects"   Codeine Other (See Comments)    GI Intolerance   Coumarin Nausea Only   Fosamax [Alendronate] Other (See Comments)    Reaction not noted- listed as allergic on [facility] paperwork   Hydrocodone-Acetaminophen Other (See Comments)    GI  Intolerance   Lotensin [Benazepril] Cough   Metformin And Related Diarrhea and Nausea Only   Norvasc [Amlodipine] Other (See Comments)    Headache     Medications: I have reviewed the patient's current medications.  Results for orders placed or performed during the hospital encounter of 04/13/22 (from the past 48 hour(s))  Hemoglobin A1c     Status: Abnormal   Collection Time: 04/13/22  5:06 PM  Result Value Ref Range   Hgb A1c MFr Bld 8.9 (H) 4.8 - 5.6 %    Comment: (NOTE) Pre diabetes:          5.7%-6.4%  Diabetes:              >6.4%  Glycemic control for   <7.0% adults with diabetes    Mean Plasma Glucose 208.73 mg/dL    Comment: Performed at [facility], [address], [place], [place] [address]  Basic metabolic panel     Status: Abnormal   Collection Time: 04/13/22  5:28 PM  Result Value Ref Range   Sodium 130 (L) 135 - 145 mmol/L   Potassium 3.7 3.5 - 5.1 mmol/L   Chloride 92 (L) 98 - 111 mmol/L   CO2 21 (L) 22 - 32 mmol/L   Glucose, Bld 254 (H) 70 - 99 mg/dL    Comment: Glucose reference range applies only to samples taken after fasting for at least 8 hours.   BUN 81 (H) 8 - 23 mg/dL   Creatinine, Ser 6.04 (H) 0.44 - 1.00 mg/dL   Calcium 9.9 8.9 - 54.0 mg/dL   GFR, Estimated 26 (L) >60 mL/min    Comment: (NOTE) Calculated using the CKD-EPI Creatinine Equation (2021)    Anion gap 17 (H) 5 - 15    Comment: Performed at [facility], [address], [place], [place] [address]  CBC with Differential     Status: Abnormal   Collection Time: 04/13/22  5:28 PM  Result Value Ref Range   WBC 20.8 (H) 4.0 - 10.5 K/uL   RBC 4.58 3.87 - 5.11 MIL/uL   Hemoglobin 13.5 12.0 - 15.0 g/dL   HCT 14.7 82.9 - 56.2 %   MCV 92.8 80.0 - 100.0 fL   MCH 29.5 26.0 - 34.0 pg   MCHC 31.8 30.0 - 36.0 g/dL   RDW 13.0 86.5 - 78.4 %   Platelets 186 150 - 400 K/uL   nRBC 0.0 0.0 - 0.2 %   Neutrophils Relative % 91 %   Neutro Abs 19.1 (H) 1.7 - 7.7 K/uL    Lymphocytes Relative 2 %   Lymphs Abs 0.5 (L) 0.7 - 4.0 K/uL   Monocytes Relative 5 %   Monocytes Absolute 0.9 0.1 - 1.0 K/uL   Eosinophils Relative 0 %   Eosinophils Absolute 0.1 0.0 - 0.5 K/uL   Basophils Relative 1 %   Basophils Absolute 0.1 0.0 - 0.1 K/uL   Immature Granulocytes 1 %   Abs Immature Granulocytes 0.13 (H) 0.00 - 0.07 K/uL    Comment: Performed at [facility], [address] [place], [place] [address]  Lactic acid, plasma     Status: None   Collection Time: 04/13/22  6:40 PM  Result Value Ref Range   Lactic Acid, Venous 1.7 0.5 - 1.9 mmol/L    Comment: Performed at [facility], [address], [place], [place] [address]  Sedimentation rate     Status: Abnormal   Collection Time: 04/13/22  6:40 PM  Result Value Ref Range   Sed Rate 31 (H) 0 - 22 mm/hr    Comment: Performed at [facility], [address] [place], [place] [address]  C-reactive protein     Status: Abnormal   Collection Time: 04/13/22  6:40 PM  Result Value Ref Range   CRP 32.9 (H) <1.0 mg/dL    Comment: Performed at [facility], [address] [place], [place] [address]  Culture, blood (routine x 2)     Status: None (Preliminary result)   Collection Time: 04/13/22  6:59 PM   Specimen: BLOOD  Result Value Ref Range   Specimen Description      BLOOD RIGHT ANTECUBITAL Performed at [facility], [address] [place], [place] [address]    Special Requests      BOTTLES DRAWN AEROBIC AND ANAEROBIC Blood Culture results may not be optimal due to an inadequate volume of blood received in culture bottles Performed at [facility], [address] [place], [place] [address]    Culture      NO GROWTH < 12 HOURS Performed at [facility], [address] [place], [place] [address]    Report Status PENDING   Culture, blood (routine x 2)     Status: None (Preliminary result)   Collection Time:  04/13/22  6:59 PM   Specimen: BLOOD  Result Value Ref Range   Specimen Description      BLOOD LEFT ANTECUBITAL Performed at [facility], [address] [place] [address], [place], [place] [address]    Special Requests      BOTTLES DRAWN AEROBIC ONLY Blood Culture results may not be optimal due to an inadequate volume of blood received in culture bottles Performed at [facility], [address], [place], [place] [address]    Culture      NO GROWTH < 12 HOURS Performed at [facility], [address], [place], [place] [address]    Report Status PENDING   Lactic acid, plasma     Status: None   Collection Time: 04/13/22  8:40 PM  Result Value Ref Range   Lactic Acid, Venous 1.4 0.5 - 1.9 mmol/L    Comment: Performed at [facility], [address] [place], [place] [address]  CBG monitoring, ED     Status: Abnormal   Collection Time: 04/13/22  9:37 PM  Result Value Ref Range   Glucose-Capillary 262 (H) 70 - 99 mg/dL    Comment: Glucose reference range applies only to samples taken after fasting for at least 8 hours.  Heparin level (unfractionated)     Status: Abnormal   Collection Time: 04/13/22 10:00 PM  Result Value Ref Range   Heparin Unfractionated 1.00 (H) 0.30 - 0.70 IU/mL    Comment: (NOTE) The clinical reportable range upper limit is being lowered to >1.10 to align with the FDA approved guidance for the current laboratory assay.  If heparin results are below expected values, and patient dosage has  been confirmed, suggest follow up testing of antithrombin III levels.  Performed at [facility], [address], [place], [place] [address]   APTT     Status: Abnormal   Collection Time: 04/13/22 10:00 PM  Result Value Ref Range   aPTT 46 (H) 24 - 36 seconds    Comment:        IF BASELINE aPTT IS ELEVATED, SUGGEST PATIENT RISK ASSESSMENT BE USED TO DETERMINE APPROPRIATE ANTICOAGULANT THERAPY. Performed at  [facility], [address] [place], [place] [address]   Glucose, capillary     Status: Abnormal   Collection Time: 04/14/22  2:05 AM  Result Value Ref Range   Glucose-Capillary 153 (H) 70 - 99 mg/dL    Comment: Glucose reference range applies only to samples taken after fasting for at least 8 hours.  Glucose, capillary     Status: None   Collection Time: 04/14/22  5:54 AM  Result Value Ref Range   Glucose-Capillary 97 70 - 99 mg/dL    Comment: Glucose reference range applies only to samples taken after fasting for at least 8 hours.  Glucose, capillary     Status: None   Collection Time: 04/14/22  7:43 AM  Result Value Ref Range   Glucose-Capillary 89 70 - 99 mg/dL    Comment: Glucose reference range applies only to samples taken after fasting for at least 8 hours.  APTT     Status: Abnormal   Collection Time: 04/14/22  7:51 AM  Result Value Ref Range   aPTT 54 (H) 24 - 36 seconds    Comment:        IF BASELINE aPTT IS ELEVATED, SUGGEST PATIENT RISK ASSESSMENT BE USED TO DETERMINE APPROPRIATE ANTICOAGULANT THERAPY. Performed at [facility], [address], [place], [place] [address]   Heparin level (unfractionated)     Status: None   Collection Time: 04/14/22  7:51 AM  Result Value Ref Range   Heparin Unfractionated 0.65 0.30 - 0.70 IU/mL    Comment: (NOTE) The clinical reportable range upper limit is being lowered to >1.10 to align with the FDA approved guidance for the current laboratory assay.  If heparin results are below expected values, and patient dosage has  been confirmed, suggest follow up testing of antithrombin III levels. Performed at [facility], [address] [place], [place] [address]   Comprehensive metabolic panel     Status: Abnormal   Collection Time: 04/14/22  7:51 AM  Result Value Ref Range   Sodium 133 (L) 135 - 145 mmol/L   Potassium 3.1 (L) 3.5 - 5.1 mmol/L   Chloride 99 98 - 111  mmol/L   CO2 25 22 - 32 mmol/L   Glucose, Bld 89 70 - 99 mg/dL    Comment: Glucose reference range applies only to samples taken after fasting for at least 8 hours.   BUN 78 (H) 8 - 23 mg/dL   Creatinine, Ser 0.10 (H) 0.44 - 1.00 mg/dL   Calcium 9.0 8.9 - 27.2 mg/dL   Total Protein 5.5 (L) 6.5 - 8.1 g/dL   Albumin 2.4 (L) 3.5 - 5.0 g/dL   AST 15 15 - 41 U/L   ALT 15 0 - 44 U/L   Alkaline Phosphatase 45 38 - 126 U/L   Total Bilirubin 1.1 0.3 - 1.2 mg/dL   GFR, Estimated 35 (L) >60 mL/min    Comment: (NOTE) Calculated using the CKD-EPI Creatinine Equation (2021)    Anion gap 9 5 - 15    Comment:  Performed at [facility], [address], [place], [place] [address]  CBC     Status: Abnormal   Collection Time: 04/14/22  7:51 AM  Result Value Ref Range   WBC 18.7 (H) 4.0 - 10.5 K/uL   RBC 3.82 (L) 3.87 - 5.11 MIL/uL   Hemoglobin 11.2 (L) 12.0 - 15.0 g/dL   HCT 19.1 (L) 47.8 - 29.5 %   MCV 92.4 80.0 - 100.0 fL   MCH 29.3 26.0 - 34.0 pg   MCHC 31.7 30.0 - 36.0 g/dL   RDW 62.1 30.8 - 65.7 %   Platelets 171 150 - 400 K/uL   nRBC 0.0 0.0 - 0.2 %    Comment: Performed at [facility], [address], [place], [place] [address]  Glucose, capillary     Status: Abnormal   Collection Time: 04/14/22 11:34 AM  Result Value Ref Range   Glucose-Capillary 54 (L) 70 - 99 mg/dL    Comment: Glucose reference range applies only to samples taken after fasting for at least 8 hours.  Glucose, capillary     Status: Abnormal   Collection Time: 04/14/22 12:03 PM  Result Value Ref Range   Glucose-Capillary 120 (H) 70 - 99 mg/dL    Comment: Glucose reference range applies only to samples taken after fasting for at least 8 hours.    VAS [place] LOWER EXTREMITY VENOUS (DVT) (7a-7p)  Result Date: 04/13/2022  Lower Venous DVT Study Patient Name:  ZUZANA SCHECKEL  Date of Exam:   04/13/2022 Medical Rec #: [id number]        Accession #:    [id number] Date of Birth: Dec 28, 1925         Patient Gender: F Patient Age:   68 years Exam Location:  [facility] Procedure:      VAS [place] LOWER EXTREMITY VENOUS (DVT) Referring Phys: Riki Sheer --------------------------------------------------------------------------------  Indications: Erythema, Swelling, and Pain. Other Indications: Patient being treated for cellulitis. Limitations: Poor ultrasound/tissue interface and swelling and skin changes. Comparison Study: No prior studies. Performing Technologist: Jean Rosenthal RDMS, RVT  Examination Guidelines: A complete evaluation includes B-mode imaging, spectral Doppler, color Doppler, and power Doppler as needed of all accessible portions of each vessel. Bilateral testing is considered an integral part of a complete examination. Limited examinations for reoccurring indications may be performed as noted. The reflux portion of the exam is performed with the patient in reverse Trendelenburg.  +---------+---------------+---------+-----------+----------+---------------+ RIGHT    CompressibilityPhasicitySpontaneityPropertiesThrombus Aging  +---------+---------------+---------+-----------+----------+---------------+ CFV      Full           Yes      Yes                                  +---------+---------------+---------+-----------+----------+---------------+ SFJ      Full                                                         +---------+---------------+---------+-----------+----------+---------------+ FV Prox  Full                                                         +---------+---------------+---------+-----------+----------+---------------+  FV Mid   Full                                                         +---------+---------------+---------+-----------+----------+---------------+ FV DistalFull                                                         +---------+---------------+---------+-----------+----------+---------------+ PFV      Full                                                          +---------+---------------+---------+-----------+----------+---------------+ POP      Full           Yes      Yes                                  +---------+---------------+---------+-----------+----------+---------------+ PTV                     Yes      Yes                  Patent by color +---------+---------------+---------+-----------+----------+---------------+ PERO                    Yes      Yes                  Patent by color +---------+---------------+---------+-----------+----------+---------------+   +----+---------------+---------+-----------+----------+--------------+ LEFTCompressibilityPhasicitySpontaneityPropertiesThrombus Aging +----+---------------+---------+-----------+----------+--------------+ CFV Full           Yes      Yes                                 +----+---------------+---------+-----------+----------+--------------+     Summary: RIGHT: - There is no evidence of deep vein thrombosis in the lower extremity. However, portions of this examination were limited- see technologist comments above.  - No cystic structure found in the popliteal fossa.  LEFT: - No evidence of common femoral vein obstruction.  *See table(s) above for measurements and observations. Electronically signed by Coral Else MD on 04/13/2022 at 11:54:55 PM.    Final    CT TIBIA FIBULA RIGHT WO CONTRAST  Result Date: 04/13/2022 CLINICAL DATA:  Soft tissue infection suspected, lower leg, xray done EXAM: CT OF THE LOWER RIGHT EXTREMITY WITHOUT CONTRAST TECHNIQUE: Multidetector CT imaging of the right lower extremity was performed according to the standard protocol. RADIATION DOSE REDUCTION: This exam was performed according to the departmental dose-optimization program which includes automated exposure control, adjustment of the mA and/or kV according to patient size and/or use of iterative reconstruction technique. COMPARISON:  None  Available. FINDINGS: Bones/Joint/Cartilage There is no evidence of acute fracture. There is no bony erosion or frank bony destruction. Diffuse osteopenia. Mild-to-moderate osteoarthritis in the midfoot and forefoot. Moderate tibiotalar osteoarthritis. There is severe tricompartment osteoarthritis of the right knee with  chondrocalcinosis. Ligaments Suboptimally assessed by CT. Muscles and Tendons There is no intramuscular collection. There is atrophy in the anterior and posterior compartments of the lower leg. Soft tissues There is diffuse skin thickening and subcutaneous soft tissue swelling of the right lower leg and foot with blister like skin lesions along the lower leg. This is asymmetric in comparison to the left lower extremity, which is partially visualized. There is no well-defined/drainable collection (other than the blister-like skin lesions), and noncontrast CT. There is no evidence of soft tissue gas. IMPRESSION: Diffuse skin thickening and subcutaneous soft tissue swelling of the right lower leg and foot, as can be seen in cellulitis or lymphedema. Blister-like skin lesions of the distal lower leg. This is asymmetric in comparison to the partially visualized left lower leg. There is no well-defined/drainable fluid collection on noncontrast CT. No acute osseous abnormality. No soft tissue gas. Electronically Signed   By: Caprice Renshaw M.D.   On: 04/13/2022 20:25   CT FOOT RIGHT WO CONTRAST  Result Date: 04/13/2022 CLINICAL DATA:  Soft tissue infection suspected, lower leg, xray done EXAM: CT OF THE LOWER RIGHT EXTREMITY WITHOUT CONTRAST TECHNIQUE: Multidetector CT imaging of the right lower extremity was performed according to the standard protocol. RADIATION DOSE REDUCTION: This exam was performed according to the departmental dose-optimization program which includes automated exposure control, adjustment of the mA and/or kV according to patient size and/or use of iterative reconstruction technique.  COMPARISON:  None Available. FINDINGS: Bones/Joint/Cartilage There is no evidence of acute fracture. There is no bony erosion or frank bony destruction. Diffuse osteopenia. Mild-to-moderate osteoarthritis in the midfoot and forefoot. Moderate tibiotalar osteoarthritis. There is severe tricompartment osteoarthritis of the right knee with chondrocalcinosis. Ligaments Suboptimally assessed by CT. Muscles and Tendons There is no intramuscular collection. There is atrophy in the anterior and posterior compartments of the lower leg. Soft tissues There is diffuse skin thickening and subcutaneous soft tissue swelling of the right lower leg and foot with blister like skin lesions along the lower leg. This is asymmetric in comparison to the left lower extremity, which is partially visualized. There is no well-defined/drainable collection (other than the blister-like skin lesions), and noncontrast CT. There is no evidence of soft tissue gas. IMPRESSION: Diffuse skin thickening and subcutaneous soft tissue swelling of the right lower leg and foot, as can be seen in cellulitis or lymphedema. Blister-like skin lesions of the distal lower leg. This is asymmetric in comparison to the partially visualized left lower leg. There is no well-defined/drainable fluid collection on noncontrast CT. No acute osseous abnormality. No soft tissue gas. Electronically Signed   By: Caprice Renshaw M.D.   On: 04/13/2022 20:25    Review of Systems  Constitutional: Negative.   HENT: Negative.    Eyes: Negative.   Respiratory: Negative.    Genitourinary: Negative.   Neurological: Negative.   Hematological: Negative.    Blood pressure (!) 110/52, pulse 70, temperature (!) 97.5 F (36.4 C), temperature source Oral, resp. rate 20, height 5\' 3"  (1.6 m), weight 53.5 kg, SpO2 93 %. Physical Exam Constitutional:      Appearance: Normal appearance.  HENT:     Head: Normocephalic and atraumatic.     Nose: Nose normal.  Eyes:     Extraocular  Movements: Extraocular movements intact.     Pupils: Pupils are equal, round, and reactive to light.  Pulmonary:     Effort: Pulmonary effort is normal.  Abdominal:     General: Abdomen is flat.  Musculoskeletal:        General: Normal range of motion.     Cervical back: Normal range of motion.  Skin:    Capillary Refill: Capillary refill takes less than 2 seconds.     Comments: Erythema is noted at the mid aspect of the right leg.  Compared to the demarcations from last night, this has improved.  There is wrinkling noted about the skin.  There is no wrinkling noted 12 hours ago.  She does have full sensation to light touch.  She does appear comfortable.  Neurological:     General: No focal deficit present.     Mental Status: She is alert.  Psychiatric:        Mood and Affect: Mood normal.        Behavior: Behavior normal.     Assessment/Plan:  The patient is clearly noted to have improving right leg cellulitis, on IV antibiotics.  I recommend 24 more hours of additional IV antibiotics.  Clearly, the patient is improving, and I do anticipate ongoing improvements.  She can be transitioned to oral antibiotics upon discharge.  From an orthopedic standpoint, I do feel that she can safely be discharged home tomorrow, although I do understand that there may be additional medical issues that might keep her here for a longer period of time.  Please feel free to contact me with any additional questions.   Abbeygail Igoe L Gaby Harney 04/14/2022, 12:48 PM

## 2022-04-14 NOTE — Progress Notes (Signed)
ANTICOAGULATION CONSULT NOTE   Pharmacy Consult for heparin Indication: hx atrial fibrillation (home xarelto on hold)  Allergies  Allergen Reactions   Fluzone Quadrivalent [Influenza Vac Split Quad] Other (See Comments)    Patient does NOT want a flu shot- stated it caused A-Fib   Penicillins Anaphylaxis and Other (See Comments)    Tolerates ceftriaxone   Amiodarone Other (See Comments)    "Multiple side effects"   Codeine Other (See Comments)    GI Intolerance   Coumarin Nausea Only   Fosamax [Alendronate] Other (See Comments)    Reaction not noted- listed as allergic on [facility] paperwork   Hydrocodone-Acetaminophen Other (See Comments)    GI Intolerance   Lotensin [Benazepril] Cough   Metformin And Related Diarrhea and Nausea Only   Norvasc [Amlodipine] Other (See Comments)    Headache     Patient Measurements: Height: 5\' 3"  (160 cm) Weight: 53.5 kg (118 lb) IBW/kg (Calculated) : 52.4 Heparin Dosing Weight: 53 kg  Vital Signs: Temp: 97.3 F (36.3 C) (09/15 0545) Temp Source: Oral (09/15 0545) BP: 109/59 (09/15 0545) Pulse Rate: 82 (09/15 0545)  Labs: Recent Labs    04/13/22 1728 04/13/22 2200 04/14/22 0751  HGB 13.5  --  11.2*  HCT 42.5  --  35.3*  PLT 186  --  171  APTT  --  46* 54*  HEPARINUNFRC  --  1.00* 0.65  CREATININE 1.81*  --  1.38*     Estimated Creatinine Clearance: 20.6 mL/min (A) (by C-G formula based on SCr of 1.38 mg/dL (H)).   Medications:  - on xarelto 20 mg daily PTA (last dose taken on 04/12/22 at 7p)  Assessment: Patient is a 88 y.o F with hx of afib on xarelto PTA who presented to the ED on 04/13/22 with LE cellulitis.  She was found to have AKI.  Pharmacy has been consulted to transition anticoag. to heparin drip.  Today, 04/14/22 HL 0.65, aPTT 54. Not yet correlating but will likely correlate soon  Hgb 11.2 , plt 171 SCr 1.38mg /dl,  CrcC 21 ml/in    Goal of Therapy:  Heparin level 0.3-0.7 units/ml aPTT 66-102  seconds Monitor platelets by anticoagulation protocol: Yes   Plan:  - Increase heparin drip to 850 units/hr - check 8 hr aPTT - Will order AM aPTT and HL  - monitor for s/sx bleeding   04/16/22, PharmD, BCPS 04/14/2022 10:17 AM

## 2022-04-15 ENCOUNTER — Inpatient Hospital Stay (HOSPITAL_COMMUNITY): Payer: Medicare Other

## 2022-04-15 DIAGNOSIS — L03115 Cellulitis of right lower limb: Secondary | ICD-10-CM | POA: Diagnosis not present

## 2022-04-15 LAB — GLUCOSE, CAPILLARY
Glucose-Capillary: 163 mg/dL — ABNORMAL HIGH (ref 70–99)
Glucose-Capillary: 156 mg/dL — ABNORMAL HIGH (ref 70–99)
Glucose-Capillary: 105 mg/dL — ABNORMAL HIGH (ref 70–99)
Glucose-Capillary: 135 mg/dL — ABNORMAL HIGH (ref 70–99)
Glucose-Capillary: 228 mg/dL — ABNORMAL HIGH (ref 70–99)

## 2022-04-15 LAB — BASIC METABOLIC PANEL
Anion gap: 7 (ref 5–15)
BUN: 51 mg/dL — ABNORMAL HIGH (ref 8–23)
CO2: 27 mmol/L (ref 22–32)
Calcium: 9.3 mg/dL (ref 8.9–10.3)
Chloride: 101 mmol/L (ref 98–111)
Creatinine, Ser: 0.93 mg/dL (ref 0.44–1.00)
GFR, Estimated: 57 mL/min — ABNORMAL LOW (ref >60)
Glucose, Bld: 189 mg/dL — ABNORMAL HIGH (ref 70–99)
Potassium: 3.7 mmol/L (ref 3.5–5.1)
Sodium: 135 mmol/L (ref 135–145)

## 2022-04-15 LAB — MAGNESIUM: Magnesium: 2.1 mg/dL (ref 1.7–2.4)

## 2022-04-15 LAB — CBC
HCT: 36.1 % (ref 36.0–46.0)
Hemoglobin: 12 g/dL (ref 12.0–15.0)
MCH: 29.9 pg (ref 26.0–34.0)
MCHC: 33.2 g/dL (ref 30.0–36.0)
MCV: 90 fL (ref 80.0–100.0)
Platelets: 166 10*3/uL (ref 150–400)
RBC: 4.01 MIL/uL (ref 3.87–5.11)
RDW: 13.4 % (ref 11.5–15.5)
WBC: 16.9 10*3/uL — ABNORMAL HIGH (ref 4.0–10.5)
nRBC: 0 % (ref 0.0–0.2)

## 2022-04-15 LAB — APTT: aPTT: 82 seconds — ABNORMAL HIGH (ref 24–36)

## 2022-04-15 LAB — HEPARIN LEVEL (UNFRACTIONATED): Heparin Unfractionated: 0.36 IU/mL (ref 0.30–0.70)

## 2022-04-15 LAB — PHOSPHORUS: Phosphorus: 2.3 mg/dL — ABNORMAL LOW (ref 2.5–4.6)

## 2022-04-15 MED ORDER — METOPROLOL TARTRATE 5 MG/5ML IV SOLN: 5.0000 mg | Freq: Four times a day (QID) | INTRAVENOUS | Status: DC | PRN

## 2022-04-15 MED ORDER — LOSARTAN POTASSIUM 50 MG PO TABS
50.0000 mg | ORAL_TABLET | Freq: Every day | ORAL | Status: DC
  Administered 2022-04-16 – 2022-04-18 (×3): 50 mg via ORAL
  Filled 2022-04-15 (×4): qty 1

## 2022-04-15 MED ORDER — FUROSEMIDE 40 MG PO TABS: 40.0000 mg | ORAL_TABLET | Freq: Every day | ORAL | Status: DC | PRN

## 2022-04-15 MED ORDER — METOPROLOL SUCCINATE ER 50 MG PO TB24
50.0000 mg | ORAL_TABLET | Freq: Every morning | ORAL | Status: DC
  Administered 2022-04-16 – 2022-04-18 (×3): 50 mg via ORAL
  Filled 2022-04-15 (×4): qty 1

## 2022-04-15 MED ORDER — FUROSEMIDE 10 MG/ML IJ SOLN
40.0000 mg | Freq: Every day | INTRAMUSCULAR | Status: DC | PRN
  Administered 2022-04-15: 40 mg via INTRAVENOUS
  Filled 2022-04-15: qty 4

## 2022-04-15 MED ORDER — RIVAROXABAN 20 MG PO TABS: 20.0000 mg | ORAL_TABLET | Freq: Every day | ORAL | Status: DC

## 2022-04-15 MED ORDER — RIVAROXABAN 15 MG PO TABS
15.0000 mg | ORAL_TABLET | Freq: Every day | ORAL | Status: DC
  Administered 2022-04-16 – 2022-04-17 (×2): 15 mg via ORAL
  Filled 2022-04-15 (×3): qty 1

## 2022-04-15 MED ORDER — LINEZOLID 600 MG/300ML IV SOLN
600.0000 mg | Freq: Two times a day (BID) | INTRAVENOUS | Status: DC
  Administered 2022-04-15 – 2022-04-16 (×3): 600 mg via INTRAVENOUS
  Filled 2022-04-15 (×3): qty 300

## 2022-04-15 MED ORDER — INSULIN ASPART 100 UNIT/ML IJ SOLN
0.0000 [IU] | Freq: Three times a day (TID) | INTRAMUSCULAR | Status: DC
  Administered 2022-04-15: 2 [IU] via SUBCUTANEOUS
  Administered 2022-04-16: 5 [IU] via SUBCUTANEOUS
  Administered 2022-04-16: 3 [IU] via SUBCUTANEOUS
  Administered 2022-04-16: 2 [IU] via SUBCUTANEOUS
  Administered 2022-04-17: 7 [IU] via SUBCUTANEOUS
  Administered 2022-04-17: 3 [IU] via SUBCUTANEOUS
  Administered 2022-04-17: 5 [IU] via SUBCUTANEOUS
  Administered 2022-04-18: 2 [IU] via SUBCUTANEOUS

## 2022-04-15 MED ORDER — GLUCERNA SHAKE PO LIQD
237.0000 mL | Freq: Two times a day (BID) | ORAL | Status: DC
  Administered 2022-04-17 – 2022-04-18 (×3): 237 mL via ORAL
  Filled 2022-04-15 (×7): qty 237

## 2022-04-15 NOTE — Plan of Care (Signed)

## 2022-04-15 NOTE — Progress Notes (Signed)
A consult was placed to the hospital's IV Therapist for new iv access;  pt had been seen by the Night IV Therapist, who had to stick 3 times to get the iv that did not last;  Pt with very poor access;  attempted 2 more times this morning to obtain a 2nd site without success;  pt currently has a PIV in the LAC, which is not completely threaded into her vein, with Heparin infusing; suggest a central line for this pt.   Thank you.

## 2022-04-15 NOTE — Progress Notes (Signed)
[place] for heparin Indication: hx atrial fibrillation (home xarelto on hold)  Allergies  Allergen Reactions   Fluzone Quadrivalent [Influenza Vac Split Quad] Other (See Comments)    Patient does NOT want a flu shot- stated it caused A-Fib   Penicillins Anaphylaxis and Other (See Comments)    Tolerates ceftriaxone   Amiodarone Other (See Comments)    "Multiple side effects"   Codeine Other (See Comments)    GI Intolerance   Coumarin Nausea Only   Fosamax [Alendronate] Other (See Comments)    Reaction not noted- listed as allergic on [facility] paperwork   Hydrocodone-Acetaminophen Other (See Comments)    GI Intolerance   Lotensin [Benazepril] Cough   Metformin And Related Diarrhea and Nausea Only   Norvasc [Amlodipine] Other (See Comments)    Headache     Patient Measurements: Height: 5\' 3"  (160 cm) Weight: 53.5 kg (118 lb) IBW/kg (Calculated) : 52.4 Heparin Dosing Weight: total body weight   Vital Signs: Temp: 97.8 F (36.6 C) (09/16 0539) Temp Source: Oral (09/15 2351) BP: 143/67 (09/16 0539) Pulse Rate: 76 (09/16 0539)  Labs: Recent Labs    04/13/22 1728 04/13/22 2200 04/13/22 2200 04/14/22 0751 04/14/22 1838 04/15/22 0526  HGB 13.5  --   --  11.2*  --   --   HCT 42.5  --   --  35.3*  --   --   PLT 186  --   --  171  --   --   APTT  --  46*   < > 54* 64* 82*  HEPARINUNFRC  --  1.00*  --  0.65  --  0.36  CREATININE 1.81*  --   --  1.38*  --   --    < > = values in this interval not displayed.     Estimated Creatinine Clearance: 20.6 mL/min (A) (by C-G formula based on SCr of 1.38 mg/dL (H)).   Medications:  - on xarelto 20 mg daily PTA (last dose taken on 04/12/22 at 7p)  Assessment: Patient is a 88 y.o F with hx of afib on xarelto PTA who presented to the ED on 04/13/22 with LE cellulitis.  She was found to have AKI.  Pharmacy has been consulted to transition anticoagulation to heparin drip.  Today, 04/15/22 aPTT  = 82 seconds, therapeutic Heparin level = 0.36 units/mL, also therapeutic CBC: Hgb, Pltc WNL SCr improved to WNL No bleeding or infusion issues noted per nursing  Goal of Therapy:  Heparin level 0.3-0.7 units/ml aPTT 66-102 seconds Monitor platelets by anticoagulation protocol: Yes   Plan:  -Continue heparin infusion at 950 units/hr -Heparin level in 8 hours to confirm remains within therapeutic range -No need for further aPTT monitoring since aPTT and heparin levels correlating  -Daily CBC, heparin level -Monitor closely for s/sx of bleeding   Lindell Spar, PharmD, BCPS Clinical Pharmacist 04/15/2022 6:05 AM

## 2022-04-15 NOTE — Progress Notes (Signed)
PROGRESS NOTE    Jasmine Jenkins  YFV:[id number] DOB: [birth year] DOA: 04/13/2022 PCP: Kirby Funk, MD   Brief Narrative:  This 88 years old female with PMH significant for type 2 diabetes, hypertension, RAS, history of strep sepsis presented in the ED with complaints of right leg pain and swelling for last 1 week.  Patient has gone to her primary care physician and has been receiving IM ceftriaxone daily,  Patient reports persistent pain and swelling has worsened, PCP has been checking her CBC daily which shows increasing white cell count.  Patient was sent from PCP for admission. Patient is admitted for right leg cellulitis and started on IV antibiotics.  Assessment & Plan:   Principal Problem:   Cellulitis of right leg Active Problems:   AKI (acute kidney injury) (HCC)   Sepsis due to cellulitis (HCC)   Atrial fibrillation (HCC)   HYPERTENSION, BENIGN   DM2 (diabetes mellitus, type 2) (HCC)  Right lower extremity cellulitis: Patient presented with worsening pain, swelling and redness in the right lower extremity.   Patient failed outpatient antibiotics treatment. CT shows no subcutaneous air, fluid-filled bulla is noted. Initiated IV antibiotics (vancomycin and cefepime). Orthopedic consulted for possible necrotizing fasciitis, It was ruled out. Ortho recommended cellulitis is improving.  Recommended change to oral antibiotics tomorrow.   Leukocytosis is improving.  Redness has significantly improved.  Change to linezolid.  Sepsis due to cellulitis: Patient meets sepsis criteria at the time of admission (tachycardia, leukocytosis, lactic acid 1.7.  RLE cellulitis Continue IV hydration Continue antibiotics,  follow-up cultures.  Acute kidney injury: > Resolved Likely prerenal ATN in the setting of cellulitis Hold losartan, continue IV hydration.  Monitor renal functions Renal functions are improving 1.81> 1.32> 0.93  Atrial fibrillation: Xarelto was on hold in the setting  of AKI and started on heparin gtt.  Continue metoprolol for heart rate control. Heparin discontinued,  resumed on Xarelto.  Diabetes mellitus type 2 Hold p.o. diabetic medications Continue regular insulin sliding scale  Essential hypertension: Losartan resumed. Continue metoprolol for hypertension.  Hypokalemia: Replaced.  Continue to monitor.  Acute hypoxic respiratory failure: Patient became short of breath last night, was placed on 2 L of supplemental oxygen. Obtain chest x-ray. Could be secondary to vancomycin.  Antibiotics changed to linezolid. Continue supplemental oxygen and wean as tolerated.   DVT prophylaxis: Heparin gtt Code Status: Full code Family Communication: Son at bed side Disposition Plan:   Status is: Inpatient Remains inpatient appropriate because: Admitted for RLE cellulitis,  failed outpatient treatment requiring IV antibiotics.  Anticipated discharge in 1-2 days.    Consultants:  [facility]th[facility]adics  Procedures: CT   Antimicrobials:  Anti-infectives (From admission, onward)    Start     Dose/Rate Route Frequency Ordered Stop   04/15/22 1200  linezolid (ZYVOX) IVPB 600 mg        600 mg 300 mL/hr over 60 Minutes Intravenous Every 12 hours 04/15/22 1100     04/13/22 2200  ceFEPIme (MAXIPIME) 2 g in sodium chloride 0.9 % 100 mL IVPB        2 g 200 mL/hr over 30 Minutes Intravenous Every 24 hours 04/13/22 2120     04/13/22 2118  vancomycin variable dose per unstable renal function (pharmacist dosing)  Status:  Discontinued         Does not apply See admin instructions 04/13/22 2118 04/15/22 1059   04/13/22 1900  vancomycin (VANCOCIN) IVPB 1000 mg/200 mL premix        1,000  No results for input(s): "CHOL", "HDL", "LDLCALC", "TRIG", "CHOLHDL", "LDLDIRECT" in the last 72 hours. Thyroid Function Tests: No results for input(s): "TSH", "T4TOTAL", "FREET4", "T3FREE", "THYROIDAB" in the last 72 hours. Anemia Panel: No results for input(s): "VITAMINB12", "FOLATE", "FERRITIN", "TIBC", "IRON", "RETICCTPCT" in the last 72 hours. Sepsis Labs: Recent Labs  Lab 04/13/22 1840 04/13/22 2040  LATICACIDVEN 1.7 1.4    Recent Results (from the past 240 hour(s))  Culture, blood (routine x 2)     Status: None (Preliminary result)   Collection Time: 04/13/22  6:59 PM   Specimen: BLOOD  Result Value Ref Range Status   Specimen Description   Final    BLOOD RIGHT ANTECUBITAL Performed at [facility] [place] [address] [place], [place] [address]    Special Requests   Final    BOTTLES DRAWN AEROBIC AND ANAEROBIC Blood Culture results may not be optimal due to an inadequate volume of blood received in culture bottles Performed at [facility] [address], [place], [place] [address]    Culture   Final    NO GROWTH 2 DAYS Performed at [facility] [address], [place], [place] [address]    Report Status PENDING  Incomplete  Culture, blood (routine x 2)     Status: None (Preliminary result)   Collection Time: 04/13/22  6:59 PM   Specimen: BLOOD  Result Value Ref Range Status   Specimen Description   Final    BLOOD LEFT ANTECUBITAL Performed at [facility] [address] [place], [place] [address]    Special Requests   Final    BOTTLES DRAWN AEROBIC ONLY Blood Culture results may not be optimal due to an inadequate volume of blood received in culture bottles Performed at [facility] [address] [place], [place] [address]    Culture   Final    NO GROWTH 2 DAYS Performed at [facility] [address] [place], [place] [address]    Report Status PENDING  Incomplete    Radiology Studies: DG CHEST PORT 1 VIEW  Result Date: 04/15/2022 CLINICAL DATA:  N[age] years inpatient with hypoxia. EXAM: PORTABLE CHEST 1 VIEW COMPARISON:  Chest radiograph dated November 10, 2019 FINDINGS: The heart is mildly enlarged. Left basilar opacity suggesting atelectasis or infiltrate. Right lung is clear. Thoracic spondylosis and bilateral glenohumeral and acromioclavicular joint arthritic changes. IMPRESSION: Left basilar opacity suggesting atelectasis or infiltrate. Mild cardiomegaly. No evidence of pulmonary edema or large pleural effusion. Electronically Signed   By: Keane Police D.O.   On: 04/15/2022 12:51   VAS [place] LOWER EXTREMITY VENOUS (DVT) (7a-7p)  Result Date: 04/13/2022  Lower Venous DVT Study Patient Name:  Jasmine Jenkins  Date of Exam:   04/13/2022 Medical Rec #: [id number]        Accession #:    [id number] Date of Birth: 09/26/1925        Patient Gender: F Patient Age:   21 years Exam Location:  [facility] Procedure:      VAS [place] LOWER EXTREMITY VENOUS (DVT)  Referring Phys: Joanette Gula --------------------------------------------------------------------------------  Indications: Erythema, Swelling, and Pain. Other Indications: Patient being treated for cellulitis. Limitations: Poor ultrasound/tissue interface and swelling and skin changes. Comparison Study: No prior studies. Performing Technologist: Darlin Coco RDMS, RVT  Examination Guidelines: A complete evaluation includes B-mode imaging, spectral Doppler, color Doppler, and power Doppler as needed of all accessible portions of each vessel. Bilateral testing  PROGRESS NOTE    Jasmine Jenkins  YFV:[id number] DOB: [birth year] DOA: 04/13/2022 PCP: Kirby Funk, MD   Brief Narrative:  This 88 years old female with PMH significant for type 2 diabetes, hypertension, RAS, history of strep sepsis presented in the ED with complaints of right leg pain and swelling for last 1 week.  Patient has gone to her primary care physician and has been receiving IM ceftriaxone daily,  Patient reports persistent pain and swelling has worsened, PCP has been checking her CBC daily which shows increasing white cell count.  Patient was sent from PCP for admission. Patient is admitted for right leg cellulitis and started on IV antibiotics.  Assessment & Plan:   Principal Problem:   Cellulitis of right leg Active Problems:   AKI (acute kidney injury) (HCC)   Sepsis due to cellulitis (HCC)   Atrial fibrillation (HCC)   HYPERTENSION, BENIGN   DM2 (diabetes mellitus, type 2) (HCC)  Right lower extremity cellulitis: Patient presented with worsening pain, swelling and redness in the right lower extremity.   Patient failed outpatient antibiotics treatment. CT shows no subcutaneous air, fluid-filled bulla is noted. Initiated IV antibiotics (vancomycin and cefepime). Orthopedic consulted for possible necrotizing fasciitis, It was ruled out. Ortho recommended cellulitis is improving.  Recommended change to oral antibiotics tomorrow.   Leukocytosis is improving.  Redness has significantly improved.  Change to linezolid.  Sepsis due to cellulitis: Patient meets sepsis criteria at the time of admission (tachycardia, leukocytosis, lactic acid 1.7.  RLE cellulitis Continue IV hydration Continue antibiotics,  follow-up cultures.  Acute kidney injury: > Resolved Likely prerenal ATN in the setting of cellulitis Hold losartan, continue IV hydration.  Monitor renal functions Renal functions are improving 1.81> 1.32> 0.93  Atrial fibrillation: Xarelto was on hold in the setting  of AKI and started on heparin gtt.  Continue metoprolol for heart rate control. Heparin discontinued,  resumed on Xarelto.  Diabetes mellitus type 2 Hold p.o. diabetic medications Continue regular insulin sliding scale  Essential hypertension: Losartan resumed. Continue metoprolol for hypertension.  Hypokalemia: Replaced.  Continue to monitor.  Acute hypoxic respiratory failure: Patient became short of breath last night, was placed on 2 L of supplemental oxygen. Obtain chest x-ray. Could be secondary to vancomycin.  Antibiotics changed to linezolid. Continue supplemental oxygen and wean as tolerated.   DVT prophylaxis: Heparin gtt Code Status: Full code Family Communication: Son at bed side Disposition Plan:   Status is: Inpatient Remains inpatient appropriate because: Admitted for RLE cellulitis,  failed outpatient treatment requiring IV antibiotics.  Anticipated discharge in 1-2 days.    Consultants:  Orthopeadics  Procedures: CT   Antimicrobials:  Anti-infectives (From admission, onward)    Start     Dose/Rate Route Frequency Ordered Stop   04/15/22 1200  linezolid (ZYVOX) IVPB 600 mg        600 mg 300 mL/hr over 60 Minutes Intravenous Every 12 hours 04/15/22 1100     04/13/22 2200  ceFEPIme (MAXIPIME) 2 g in sodium chloride 0.9 % 100 mL IVPB        2 g 200 mL/hr over 30 Minutes Intravenous Every 24 hours 04/13/22 2120     04/13/22 2118  vancomycin variable dose per unstable renal function (pharmacist dosing)  Status:  Discontinued         Does not apply See admin instructions 04/13/22 2118 04/15/22 1059   04/13/22 1900  vancomycin (VANCOCIN) IVPB 1000 mg/200 mL premix        1,000  No results for input(s): "CHOL", "HDL", "LDLCALC", "TRIG", "CHOLHDL", "LDLDIRECT" in the last 72 hours. Thyroid Function Tests: No results for input(s): "TSH", "T4TOTAL", "FREET4", "T3FREE", "THYROIDAB" in the last 72 hours. Anemia Panel: No results for input(s): "VITAMINB12", "FOLATE", "FERRITIN", "TIBC", "IRON", "RETICCTPCT" in the last 72 hours. Sepsis Labs: Recent Labs  Lab 04/13/22 1840 04/13/22 2040  LATICACIDVEN 1.7 1.4    Recent Results (from the past 240 hour(s))  Culture, blood (routine x 2)     Status: None (Preliminary result)   Collection Time: 04/13/22  6:59 PM   Specimen: BLOOD  Result Value Ref Range Status   Specimen Description   Final    BLOOD RIGHT ANTECUBITAL Performed at [facility] [place] [address] [place], [place] [address]    Special Requests   Final    BOTTLES DRAWN AEROBIC AND ANAEROBIC Blood Culture results may not be optimal due to an inadequate volume of blood received in culture bottles Performed at [facility] [address], [place], [place] [address]    Culture   Final    NO GROWTH 2 DAYS Performed at [facility] [address], [place], [place] [address]    Report Status PENDING  Incomplete  Culture, blood (routine x 2)     Status: None (Preliminary result)   Collection Time: 04/13/22  6:59 PM   Specimen: BLOOD  Result Value Ref Range Status   Specimen Description   Final    BLOOD LEFT ANTECUBITAL Performed at [facility] [address] [place], [place] [address]    Special Requests   Final    BOTTLES DRAWN AEROBIC ONLY Blood Culture results may not be optimal due to an inadequate volume of blood received in culture bottles Performed at [facility] [address] [place], [place] [address]    Culture   Final    NO GROWTH 2 DAYS Performed at [facility] [address] [place], [place] [address]    Report Status PENDING  Incomplete    Radiology Studies: DG CHEST PORT 1 VIEW  Result Date: 04/15/2022 CLINICAL DATA:  N[age] years inpatient with hypoxia. EXAM: PORTABLE CHEST 1 VIEW COMPARISON:  Chest radiograph dated November 10, 2019 FINDINGS: The heart is mildly enlarged. Left basilar opacity suggesting atelectasis or infiltrate. Right lung is clear. Thoracic spondylosis and bilateral glenohumeral and acromioclavicular joint arthritic changes. IMPRESSION: Left basilar opacity suggesting atelectasis or infiltrate. Mild cardiomegaly. No evidence of pulmonary edema or large pleural effusion. Electronically Signed   By: Keane Police D.O.   On: 04/15/2022 12:51   VAS [place] LOWER EXTREMITY VENOUS (DVT) (7a-7p)  Result Date: 04/13/2022  Lower Venous DVT Study Patient Name:  Jasmine Jenkins  Date of Exam:   04/13/2022 Medical Rec #: [id number]        Accession #:    [id number] Date of Birth: 09/26/1925        Patient Gender: F Patient Age:   21 years Exam Location:  [facility] Procedure:      VAS [place] LOWER EXTREMITY VENOUS (DVT)  Referring Phys: Joanette Gula --------------------------------------------------------------------------------  Indications: Erythema, Swelling, and Pain. Other Indications: Patient being treated for cellulitis. Limitations: Poor ultrasound/tissue interface and swelling and skin changes. Comparison Study: No prior studies. Performing Technologist: Darlin Coco RDMS, RVT  Examination Guidelines: A complete evaluation includes B-mode imaging, spectral Doppler, color Doppler, and power Doppler as needed of all accessible portions of each vessel. Bilateral testing  No results for input(s): "CHOL", "HDL", "LDLCALC", "TRIG", "CHOLHDL", "LDLDIRECT" in the last 72 hours. Thyroid Function Tests: No results for input(s): "TSH", "T4TOTAL", "FREET4", "T3FREE", "THYROIDAB" in the last 72 hours. Anemia Panel: No results for input(s): "VITAMINB12", "FOLATE", "FERRITIN", "TIBC", "IRON", "RETICCTPCT" in the last 72 hours. Sepsis Labs: Recent Labs  Lab 04/13/22 1840 04/13/22 2040  LATICACIDVEN 1.7 1.4    Recent Results (from the past 240 hour(s))  Culture, blood (routine x 2)     Status: None (Preliminary result)   Collection Time: 04/13/22  6:59 PM   Specimen: BLOOD  Result Value Ref Range Status   Specimen Description   Final    BLOOD RIGHT ANTECUBITAL Performed at [facility] [place] [address] [place], [place] [address]    Special Requests   Final    BOTTLES DRAWN AEROBIC AND ANAEROBIC Blood Culture results may not be optimal due to an inadequate volume of blood received in culture bottles Performed at [facility] [address], [place], [place] [address]    Culture   Final    NO GROWTH 2 DAYS Performed at [facility] [address], [place], [place] [address]    Report Status PENDING  Incomplete  Culture, blood (routine x 2)     Status: None (Preliminary result)   Collection Time: 04/13/22  6:59 PM   Specimen: BLOOD  Result Value Ref Range Status   Specimen Description   Final    BLOOD LEFT ANTECUBITAL Performed at [facility] [address] [place], [place] [address]    Special Requests   Final    BOTTLES DRAWN AEROBIC ONLY Blood Culture results may not be optimal due to an inadequate volume of blood received in culture bottles Performed at [facility] [address] [place], [place] [address]    Culture   Final    NO GROWTH 2 DAYS Performed at [facility] [address] [place], [place] [address]    Report Status PENDING  Incomplete    Radiology Studies: DG CHEST PORT 1 VIEW  Result Date: 04/15/2022 CLINICAL DATA:  N[age] years inpatient with hypoxia. EXAM: PORTABLE CHEST 1 VIEW COMPARISON:  Chest radiograph dated November 10, 2019 FINDINGS: The heart is mildly enlarged. Left basilar opacity suggesting atelectasis or infiltrate. Right lung is clear. Thoracic spondylosis and bilateral glenohumeral and acromioclavicular joint arthritic changes. IMPRESSION: Left basilar opacity suggesting atelectasis or infiltrate. Mild cardiomegaly. No evidence of pulmonary edema or large pleural effusion. Electronically Signed   By: Keane Police D.O.   On: 04/15/2022 12:51   VAS [place] LOWER EXTREMITY VENOUS (DVT) (7a-7p)  Result Date: 04/13/2022  Lower Venous DVT Study Patient Name:  Jasmine Jenkins  Date of Exam:   04/13/2022 Medical Rec #: [id number]        Accession #:    [id number] Date of Birth: 09/26/1925        Patient Gender: F Patient Age:   21 years Exam Location:  [facility] Procedure:      VAS [place] LOWER EXTREMITY VENOUS (DVT)  Referring Phys: Joanette Gula --------------------------------------------------------------------------------  Indications: Erythema, Swelling, and Pain. Other Indications: Patient being treated for cellulitis. Limitations: Poor ultrasound/tissue interface and swelling and skin changes. Comparison Study: No prior studies. Performing Technologist: Darlin Coco RDMS, RVT  Examination Guidelines: A complete evaluation includes B-mode imaging, spectral Doppler, color Doppler, and power Doppler as needed of all accessible portions of each vessel. Bilateral testing  PROGRESS NOTE    Jasmine Jenkins  YFV:[id number] DOB: [birth year] DOA: 04/13/2022 PCP: Kirby Funk, MD   Brief Narrative:  This 88 years old female with PMH significant for type 2 diabetes, hypertension, RAS, history of strep sepsis presented in the ED with complaints of right leg pain and swelling for last 1 week.  Patient has gone to her primary care physician and has been receiving IM ceftriaxone daily,  Patient reports persistent pain and swelling has worsened, PCP has been checking her CBC daily which shows increasing white cell count.  Patient was sent from PCP for admission. Patient is admitted for right leg cellulitis and started on IV antibiotics.  Assessment & Plan:   Principal Problem:   Cellulitis of right leg Active Problems:   AKI (acute kidney injury) (HCC)   Sepsis due to cellulitis (HCC)   Atrial fibrillation (HCC)   HYPERTENSION, BENIGN   DM2 (diabetes mellitus, type 2) (HCC)  Right lower extremity cellulitis: Patient presented with worsening pain, swelling and redness in the right lower extremity.   Patient failed outpatient antibiotics treatment. CT shows no subcutaneous air, fluid-filled bulla is noted. Initiated IV antibiotics (vancomycin and cefepime). Orthopedic consulted for possible necrotizing fasciitis, It was ruled out. Ortho recommended cellulitis is improving.  Recommended change to oral antibiotics tomorrow.   Leukocytosis is improving.  Redness has significantly improved.  Change to linezolid.  Sepsis due to cellulitis: Patient meets sepsis criteria at the time of admission (tachycardia, leukocytosis, lactic acid 1.7.  RLE cellulitis Continue IV hydration Continue antibiotics,  follow-up cultures.  Acute kidney injury: > Resolved Likely prerenal ATN in the setting of cellulitis Hold losartan, continue IV hydration.  Monitor renal functions Renal functions are improving 1.81> 1.32> 0.93  Atrial fibrillation: Xarelto was on hold in the setting  of AKI and started on heparin gtt.  Continue metoprolol for heart rate control. Heparin discontinued,  resumed on Xarelto.  Diabetes mellitus type 2 Hold p.o. diabetic medications Continue regular insulin sliding scale  Essential hypertension: Losartan resumed. Continue metoprolol for hypertension.  Hypokalemia: Replaced.  Continue to monitor.  Acute hypoxic respiratory failure: Patient became short of breath last night, was placed on 2 L of supplemental oxygen. Obtain chest x-ray. Could be secondary to vancomycin.  Antibiotics changed to linezolid. Continue supplemental oxygen and wean as tolerated.   DVT prophylaxis: Heparin gtt Code Status: Full code Family Communication: Son at bed side Disposition Plan:   Status is: Inpatient Remains inpatient appropriate because: Admitted for RLE cellulitis,  failed outpatient treatment requiring IV antibiotics.  Anticipated discharge in 1-2 days.    Consultants:  Orthopeadics  Procedures: CT   Antimicrobials:  Anti-infectives (From admission, onward)    Start     Dose/Rate Route Frequency Ordered Stop   04/15/22 1200  linezolid (ZYVOX) IVPB 600 mg        600 mg 300 mL/hr over 60 Minutes Intravenous Every 12 hours 04/15/22 1100     04/13/22 2200  ceFEPIme (MAXIPIME) 2 g in sodium chloride 0.9 % 100 mL IVPB        2 g 200 mL/hr over 30 Minutes Intravenous Every 24 hours 04/13/22 2120     04/13/22 2118  vancomycin variable dose per unstable renal function (pharmacist dosing)  Status:  Discontinued         Does not apply See admin instructions 04/13/22 2118 04/15/22 1059   04/13/22 1900  vancomycin (VANCOCIN) IVPB 1000 mg/200 mL premix        1,000

## 2022-04-15 NOTE — Progress Notes (Signed)
   04/15/22 1647  PT Visit Information  Last PT Received On 04/15/22  Reason Eval/Treat Not Completed Other (comment) (Pt's daughter requesting PT hold until tomorrow. RN recently adminstered pain medication for reported 8/10 pain and pt heavily sleeping upon entry. RN and daughter present. PT will follow up as schedule allows.)

## 2022-04-16 DIAGNOSIS — I739 Peripheral vascular disease, unspecified: Secondary | ICD-10-CM

## 2022-04-16 DIAGNOSIS — L03115 Cellulitis of right lower limb: Secondary | ICD-10-CM | POA: Diagnosis not present

## 2022-04-16 DIAGNOSIS — L03116 Cellulitis of left lower limb: Secondary | ICD-10-CM

## 2022-04-16 LAB — CREATININE, SERUM
Creatinine, Ser: 0.6 mg/dL (ref 0.44–1.00)
GFR, Estimated: >60 mL/min (ref >60)

## 2022-04-16 LAB — CBC
HCT: 38.8 % (ref 36.0–46.0)
Hemoglobin: 12.4 g/dL (ref 12.0–15.0)
MCH: 29.4 pg (ref 26.0–34.0)
MCHC: 32 g/dL (ref 30.0–36.0)
MCV: 91.9 fL (ref 80.0–100.0)
Platelets: 186 10*3/uL (ref 150–400)
RBC: 4.22 MIL/uL (ref 3.87–5.11)
RDW: 13.4 % (ref 11.5–15.5)
WBC: 16.1 10*3/uL — ABNORMAL HIGH (ref 4.0–10.5)
nRBC: 0.1 % (ref 0.0–0.2)

## 2022-04-16 LAB — GLUCOSE, CAPILLARY
Glucose-Capillary: 119 mg/dL — ABNORMAL HIGH (ref 70–99)
Glucose-Capillary: 161 mg/dL — ABNORMAL HIGH (ref 70–99)
Glucose-Capillary: 217 mg/dL — ABNORMAL HIGH (ref 70–99)
Glucose-Capillary: 258 mg/dL — ABNORMAL HIGH (ref 70–99)

## 2022-04-16 MED ORDER — SODIUM CHLORIDE 0.9 % IV SOLN
2.0000 g | Freq: Two times a day (BID) | INTRAVENOUS | Status: DC
  Administered 2022-04-16 – 2022-04-17 (×2): 2 g via INTRAVENOUS
  Filled 2022-04-16 (×3): qty 12.5

## 2022-04-16 MED ORDER — LINEZOLID 600 MG PO TABS
600.0000 mg | ORAL_TABLET | Freq: Two times a day (BID) | ORAL | Status: DC
  Administered 2022-04-17 – 2022-04-18 (×3): 600 mg via ORAL
  Filled 2022-04-16 (×4): qty 1

## 2022-04-16 MED ORDER — TRAMADOL HCL 50 MG PO TABS
50.0000 mg | ORAL_TABLET | Freq: Three times a day (TID) | ORAL | Status: DC | PRN
  Administered 2022-04-16 – 2022-04-19 (×6): 50 mg via ORAL
  Filled 2022-04-16 (×6): qty 1

## 2022-04-16 NOTE — Evaluation (Signed)
Occupational Therapy Evaluation Patient Details Name: Jasmine Jenkins MRN: [id number] DOB: 07/29/1926 Today's Date: 04/16/2022   History of Present Illness Patient is a 88 year old female who presented to the hospital with with RLE edema and pain with 1 week history. patient was found to have cellulitis ,sepsis,acute hypoxic respiratory failure, and AKI. PMH: a fib, DM II, HTN,   Clinical Impression   Patient was noted to be admitted for above. Patient was noted to have increased pain with all movement and unable to tolerate attempts at standing with RLE in dependent positioning secondary to pain. Patient was noted to have increased coldness in digits with O2 monitor added to earlobe on this date to achieve reading. Patient was able to maintain O2 on 2L/min about 96% during session. Patient's son was present in room reporting family could assist in next level of care but not 24/7. Patient currently would need 24/7 caregiver support with +2 for transfers and ADLs. Patient was noted to have decreased functional activity tolerance, decreased endurance, decreased standing balance, decreased safety awareness, and decreased knowledge of AD/AE impacting participation in ADLs. Patient would continue to benefit from skilled OT services at this time while admitted and after d/c to address noted deficits in order to improve overall safety and independence in ADLs.        Recommendations for follow up therapy are one component of a multi-disciplinary discharge planning process, led by the attending physician.  Recommendations may be updated based on patient status, additional functional criteria and insurance authorization.   Follow Up Recommendations  Skilled nursing-short term rehab (<3 hours/day)    Assistance Recommended at Discharge Frequent or constant Supervision/Assistance  Patient can return home with the following Two people to help with walking and/or transfers;A lot of help with  bathing/dressing/bathroom;Assistance with cooking/housework;Direct supervision/assist for financial management;Assist for transportation;Help with stairs or ramp for entrance;Direct supervision/assist for medications management    Functional Status Assessment  Patient has had a recent decline in their functional status and demonstrates the ability to make significant improvements in function in a reasonable and predictable amount of time.  Equipment Recommendations  Other (comment) (defer to next venue)    Recommendations for Other Services       Precautions / Restrictions Precautions Precautions: Fall Precaution Comments: RLE weeping with blisters Restrictions Weight Bearing Restrictions: No      Mobility Bed Mobility Overal bed mobility: Needs Assistance Bed Mobility: Supine to Sit, Sit to Supine     Supine to sit: Mod assist, HOB elevated Sit to supine: Mod assist   General bed mobility comments: with increased time patient needed physical assist to get trunk into midline on EOB and  BLE back into bed.    Transfers                          Balance Overall balance assessment: Needs assistance         Standing balance support: Bilateral upper extremity supported, Reliant on assistive device for balance Standing balance-Leahy Scale: Zero                             ADL either performed or assessed with clinical judgement   ADL Overall ADL's : Needs assistance/impaired Eating/Feeding: Set up;Sitting   Grooming: Wash/dry face;Set up;Bed level   Upper Body Bathing: Bed level;Minimal assistance   Lower Body Bathing: Bed level;Total assistance   Upper Body Dressing :  Bed level;Minimal assistance   Lower Body Dressing: Bed level;Total assistance     Toilet Transfer Details (indicate cue type and reason): patient attempted standing with self limting WB on RLE with patient attempting to keep it off the floor with modA . patient attempted  standing x2 with patient reporting increased pain in LE and requested to get back into bed at this time. Toileting- Clothing Manipulation and Hygiene: Total assistance;Bed level       Functional mobility during ADLs: +2 for physical assistance;+2 for safety/equipment       Vision Baseline Vision/History: 1 Wears glasses       Perception     Praxis      Pertinent Vitals/Pain Pain Assessment Pain Assessment: Faces Faces Pain Scale: Hurts worst Pain Location: attempt at standing Pain Descriptors / Indicators: Discomfort, Grimacing, Moaning, Guarding Pain Intervention(s): Limited activity within patient's tolerance, Monitored during session, Premedicated before session, Repositioned     Hand Dominance Right   Extremity/Trunk Assessment Upper Extremity Assessment Upper Extremity Assessment: Overall WFL for tasks assessed   Lower Extremity Assessment Lower Extremity Assessment: Defer to PT evaluation   Cervical / Trunk Assessment Cervical / Trunk Assessment: Kyphotic   Communication Communication Communication: HOH   Cognition Arousal/Alertness: Awake/alert Behavior During Therapy: WFL for tasks assessed/performed Overall Cognitive Status: Within Functional Limits for tasks assessed                                 General Comments: patient was noted to be hard of hearing but appropirate during session. son was present to provide some PLOF as well     General Comments       Exercises     Shoulder Instructions      Home Living Family/patient expects to be discharged to:: Private residence Living Arrangements: Alone Available Help at Discharge: Family;Available PRN/intermittently Type of Home: House Home Access: Ramped entrance     Home Layout: One level         Bathroom Toilet: Standard                Prior Functioning/Environment Prior Level of Function : Independent/Modified Independent               ADLs Comments: son  reported living next door        OT Problem List: Decreased activity tolerance;Decreased coordination;Decreased safety awareness;Cardiopulmonary status limiting activity;Increased edema;Pain      OT Treatment/Interventions: Self-care/ADL training;Therapeutic exercise;Neuromuscular education;Energy conservation;DME and/or AE instruction;Therapeutic activities;Balance training;Patient/family education    OT Goals(Current goals can be found in the care plan section) Acute Rehab OT Goals Patient Stated Goal: to get pain level down OT Goal Formulation: With patient/family Time For Goal Achievement: 04/30/22 Potential to Achieve Goals: Fair  OT Frequency: Min 2X/week    Co-evaluation              AM-PAC OT "6 Clicks" Daily Activity     Outcome Measure Help from another person eating meals?: A Little Help from another person taking care of personal grooming?: A Little Help from another person toileting, which includes using toliet, bedpan, or urinal?: Total Help from another person bathing (including washing, rinsing, drying)?: A Lot Help from another person to put on and taking off regular upper body clothing?: A Little Help from another person to put on and taking off regular lower body clothing?: A Lot 6 Click Score: 14   End of Session Equipment Utilized  During Treatment: Gait belt;Rolling walker (2 wheels);Oxygen Nurse Communication: Other (comment) (nurse in room during session)  Activity Tolerance: Patient limited by pain Patient left: in bed;with call bell/phone within reach;with family/visitor present;with nursing/sitter in room  OT Visit Diagnosis: Unsteadiness on feet (R26.81);Other abnormalities of gait and mobility (R26.89);Muscle weakness (generalized) (M62.81)                Time: 8280-0349 OT Time Calculation (min): 38 min Charges:  OT General Charges $OT Visit: 1 Visit OT Evaluation $OT Eval Moderate Complexity: 1 Mod OT Treatments $Self Care/Home Management  : 23-37 mins  Rennie Plowman, MS Acute Rehabilitation Department Office# [phone]   Marcellina Millin 04/16/2022, 12:41 PM

## 2022-04-16 NOTE — Progress Notes (Signed)
Pharmacy Antibiotic Note  Jasmine Jenkins is a 88 y.o. female admitted on 04/13/2022 with cellulitis & PNA.  Pharmacy has been consulted for cefepime dosing. D#4 abx for cellulitis & opacity on CXR. WBC remains elevated but stable at 16.1 SCr down to 0.6, CrCl 36 ml/min.  AF  9/14 BCx 2: ngtd  Plan: Increase cefepime to 2 gm q12 Zyvox 600 mg q12> change from IV to PO  Height: 5\' 3"  (160 cm) Weight: 53.5 kg (118 lb) IBW/kg (Calculated) : 52.4  Temp (24hrs), Avg:98.1 F (36.7 C), Min:97.7 F (36.5 C), Max:98.8 F (37.1 C)  Recent Labs  Lab 04/13/22 1728 04/13/22 1840 04/13/22 2040 04/14/22 0751 04/15/22 0546 04/16/22 0520  WBC 20.8*  --   --  18.7* 16.9* 16.1*  CREATININE 1.81*  --   --  1.38* 0.93 0.60  LATICACIDVEN  --  1.7 1.4  --   --   --     Estimated Creatinine Clearance: 35.6 mL/min (by C-G formula based on SCr of 0.6 mg/dL).    Allergies  Allergen Reactions   Fluzone Quadrivalent [Influenza Vac Split Quad] Other (See Comments)    Patient does NOT want a flu shot- stated it caused A-Fib   Penicillins Anaphylaxis and Other (See Comments)    Tolerates ceftriaxone   Amiodarone Other (See Comments)    "Multiple side effects"   Codeine Other (See Comments)    GI Intolerance   Coumarin Nausea Only   Fosamax [Alendronate] Other (See Comments)    Reaction not noted- listed as allergic on [facility] paperwork   Hydrocodone-Acetaminophen Other (See Comments)    GI Intolerance   Lotensin [Benazepril] Cough   Metformin And Related Diarrhea and Nausea Only   Norvasc [Amlodipine] Other (See Comments)    Headache     Thank you for allowing pharmacy to be a part of this patient's care.   Eudelia Bunch, Pharm.D 04/16/2022 2:40 PM

## 2022-04-16 NOTE — Progress Notes (Signed)
Right lateral (outer) blister on RLE opened up this morning and is draining large amount of purulent drainage with no odor noted. Gently cleansed with soap and warm water and new pad placed on top of pillow used to elevate to collect any draining fluid. Will pass onto receiving RN to monitor for continued drainage.   04/16/22 0625  Wound / Incision (Open or Dehisced) 04/13/22 Other (Comment);Non-pressure wound Pretibial Right;Lateral;Medial serosanguenous fluid filled blister and cellulitis to RLE  Date First Assessed/Time First Assessed: 04/13/22 2335   Wound Type: Other (Comment);Non-pressure wound  Location: Pretibial  Location Orientation: Right;Lateral;Medial  Wound Description (Comments): serosanguenous fluid filled blister and cellulitis ...  Dressing Type None  Dressing Status None  Site / Wound Assessment Other (Comment) (blister popped at this time/draining)  Peri-wound Assessment Erythema (blanchable)  Margins Attached edges (approximated)  Drainage Amount Copious  Drainage Description Purulent  Treatment Cleansed;Other (Comment) (pad changed and placed under while actively weeping)

## 2022-04-16 NOTE — Progress Notes (Signed)
PROGRESS NOTE    Jasmine Jenkins  GUR:[id number] DOB: 02/13/1926 DOA: 04/13/2022 PCP: Kirby Funk, MD   Brief Narrative:  This 88 years old female with PMH significant for type 2 diabetes, hypertension, RAS, history of strep sepsis presented in the ED with complaints of right leg pain and swelling for last 1 week.  Patient has gone to her primary care physician and has been receiving IM ceftriaxone daily,  Patient reports persistent pain and swelling has worsened, PCP has been checking her CBC daily which shows increasing white cell count.  Patient was sent from PCP for admission. Patient is admitted for right leg cellulitis and started on IV antibiotics.  Assessment & Plan:   Principal Problem:   Cellulitis of right leg Active Problems:   AKI (acute kidney injury) (HCC)   Sepsis due to cellulitis (HCC)   Atrial fibrillation (HCC)   HYPERTENSION, BENIGN   DM2 (diabetes mellitus, type 2) (HCC)  Right lower extremity cellulitis: Patient presented with worsening pain, swelling and redness in the right lower extremity.   Patient failed outpatient antibiotics treatment. CT shows no subcutaneous air, fluid-filled bulla is noted. Initiated IV antibiotics (vancomycin and cefepime). Orthopedic consulted for possible necrotizing fasciitis, It was ruled out. Ortho recommended cellulitis is improving.  Recommended change to oral antibiotics tomorrow.   Leukocytosis is improving.  Redness has significantly improved. Antibiotics changed to linezolid. She started draining from blisters, happened as a result of shear force while moving on the bed.  Sepsis due to cellulitis: Patient meets sepsis criteria at the time of admission (tachycardia, leukocytosis, lactic acid 1.7.  RLE cellulitis) Continue IV hydration Continue antibiotics, blood cultures no growth so far.  Acute kidney injury: > Resolved Likely prerenal ATN in the setting of cellulitis Held losartan, continue IV hydration.  Monitor  renal functions. Renal functions are improving 1.81> 1.32> 0.93>0.66  Atrial fibrillation: Xarelto was on hold in the setting of AKI and started on heparin gtt.  Continue metoprolol for heart rate control. Heparin discontinued,  resumed on Xarelto.  Diabetes mellitus type 2 Hold p.o. diabetic medications Continue regular insulin sliding scale  Essential hypertension: Losartan resumed. Continue metoprolol for hypertension. BP controlled.  Hypokalemia: Replaced.  Continue to monitor.  Acute hypoxic respiratory failure: Patient became short of breath, was placed on 2 L of supplemental oxygen. Chest x-ray shows infiltrate versus atelectasis.  Antibiotics changed to linezolid. Continue supplemental oxygen and wean as tolerated. Obtain procalcitonin level.   DVT prophylaxis: Heparin gtt Code Status: Full code Family Communication: Son at bed side Disposition Plan:   Status is: Inpatient Remains inpatient appropriate because: Admitted for RLE cellulitis,  failed outpatient treatment requiring IV antibiotics.. PT and OT recommended SNF  Anticipated discharge in 1-2 days.    Consultants:  Orthopeadics  Procedures: CT   Antimicrobials:  Anti-infectives (From admission, onward)    Start     Dose/Rate Route Frequency Ordered Stop   04/15/22 1200  linezolid (ZYVOX) IVPB 600 mg        600 mg 300 mL/hr over 60 Minutes Intravenous Every 12 hours 04/15/22 1100     04/13/22 2200  ceFEPIme (MAXIPIME) 2 g in sodium chloride 0.9 % 100 mL IVPB        2 g 200 mL/hr over 30 Minutes Intravenous Every 24 hours 04/13/22 2120     04/13/22 2118  vancomycin variable dose per unstable renal function (pharmacist dosing)  Status:  Discontinued         Does not apply See  admin instructions 04/13/22 2118 04/15/22 1059   04/13/22 1900  vancomycin (VANCOCIN) IVPB 1000 mg/200 mL premix        1,000 mg 200 mL/hr over 60 Minutes Intravenous  Once 04/13/22 1859 04/13/22 2051   04/13/22 1845   cefTRIAXone (ROCEPHIN) 1 g in sodium chloride 0.9 % 100 mL IVPB        1 g 200 mL/hr over 30 Minutes Intravenous  Once 04/13/22 1841 04/13/22 1939      Subjective: Patient was seen and examined at bedside.  Overnight events noted. Patient still reports having pain but better controlled.  Redness in the legs has improved with antibiotics. She still remains on 2 L of supplemental oxygen.  Bluish discoloration in the fingers has resolved.  Objective: Vitals:   04/15/22 0539 04/15/22 1615 04/15/22 2035 04/16/22 0611  BP: (!) 143/67 (!) 150/80 122/77 (!) 143/68  Pulse: 76 (!) 57 93 97  Resp: 20 14 18 18   Temp: 97.8 F (36.6 C) 98.2 F (36.8 C) 98.8 F (37.1 C) 97.8 F (36.6 C)  TempSrc:  Oral Oral Oral  SpO2: 100% 100% 100% 100%  Weight:      Height:        Intake/Output Summary (Last 24 hours) at 04/16/2022 1120 Last data filed at 04/16/2022 6962 Gross per 24 hour  Intake 700 ml  Output 2500 ml  Net -1800 ml   Filed Weights   04/13/22 1847  Weight: 53.5 kg    Examination:  General exam: Appears comfortable, not in any acute distress.  Deconditioned Respiratory system: CTA bilaterally, respiratory effort normal, RR 15 Cardiovascular system: S1-S2 heard, irregular rhythm, no murmur. Gastrointestinal system: Abdomen is soft, non tender, non distended, BS+ Central nervous system: Alert and oriented x 3 . No focal neurological deficits. Extremities: RLE: Erythema is improving.still appears swollen, warm, tender with fluid filled bulla ,started draining Skin: No rashes, lesions or ulcers Psychiatry: Judgement and insight appear normal. Mood & affect appropriate.     Data Reviewed: I have personally reviewed following labs and imaging studies  CBC: Recent Labs  Lab 04/13/22 1728 04/14/22 0751 04/15/22 0546 04/16/22 0520  WBC 20.8* 18.7* 16.9* 16.1*  NEUTROABS 19.1*  --   --   --   HGB 13.5 11.2* 12.0 12.4  HCT 42.5 35.3* 36.1 38.8  MCV 92.8 92.4 90.0 91.9  PLT  186 171 166 952   Basic Metabolic Panel: Recent Labs  Lab 04/13/22 1728 04/14/22 0751 04/15/22 0546 04/16/22 0520  NA 130* 133* 135  --   K 3.7 3.1* 3.7  --   CL 92* 99 101  --   CO2 21* 25 27  --   GLUCOSE 254* 89 189*  --   BUN 81* 78* 51*  --   CREATININE 1.81* 1.38* 0.93 0.60  CALCIUM 9.9 9.0 9.3  --   MG  --   --  2.1  --   PHOS  --   --  2.3*  --    GFR: Estimated Creatinine Clearance: 35.6 mL/min (by C-G formula based on SCr of 0.6 mg/dL). Liver Function Tests: Recent Labs  Lab 04/14/22 0751  AST 15  ALT 15  ALKPHOS 45  BILITOT 1.1  PROT 5.5*  ALBUMIN 2.4*   No results for input(s): "LIPASE", "AMYLASE" in the last 168 hours. No results for input(s): "AMMONIA" in the last 168 hours. Coagulation Profile: No results for input(s): "INR", "PROTIME" in the last 168 hours. Cardiac Enzymes: No results for input(s): "CKTOTAL", "  CKMB", "CKMBINDEX", "TROPONINI" in the last 168 hours. BNP (last 3 results) No results for input(s): "PROBNP" in the last 8760 hours. HbA1C: Recent Labs    04/13/22 1706  HGBA1C 8.9*   CBG: Recent Labs  Lab 04/15/22 0724 04/15/22 1159 04/15/22 1729 04/15/22 2237 04/16/22 0744  GLUCAP 156* 105* 135* 228* 119*   Lipid Profile: No results for input(s): "CHOL", "HDL", "LDLCALC", "TRIG", "CHOLHDL", "LDLDIRECT" in the last 72 hours. Thyroid Function Tests: No results for input(s): "TSH", "T4TOTAL", "FREET4", "T3FREE", "THYROIDAB" in the last 72 hours. Anemia Panel: No results for input(s): "VITAMINB12", "FOLATE", "FERRITIN", "TIBC", "IRON", "RETICCTPCT" in the last 72 hours. Sepsis Labs: Recent Labs  Lab 04/13/22 1840 04/13/22 2040  LATICACIDVEN 1.7 1.4    Recent Results (from the past 240 hour(s))  Culture, blood (routine x 2)     Status: None (Preliminary result)   Collection Time: 04/13/22  6:59 PM   Specimen: BLOOD  Result Value Ref Range Status   Specimen Description   Final    BLOOD RIGHT ANTECUBITAL Performed at  [facility], [address], [place], [place] [address]    Special Requests   Final    BOTTLES DRAWN AEROBIC AND ANAEROBIC Blood Culture results may not be optimal due to an inadequate volume of blood received in culture bottles Performed at [facility], [address], [place], [place] [address]    Culture   Final    NO GROWTH 3 DAYS Performed at [facility], [address], [place], [place] [address]    Report Status PENDING  Incomplete  Culture, blood (routine x 2)     Status: None (Preliminary result)   Collection Time: 04/13/22  6:59 PM   Specimen: BLOOD  Result Value Ref Range Status   Specimen Description   Final    BLOOD LEFT ANTECUBITAL Performed at [facility], [address], [place], [place] [address]    Special Requests   Final    BOTTLES DRAWN AEROBIC ONLY Blood Culture results may not be optimal due to an inadequate volume of blood received in culture bottles Performed at [facility], [address], [place], [place] [address]    Culture   Final    NO GROWTH 3 DAYS Performed at [facility], [address] [place], [place] [address]    Report Status PENDING  Incomplete    Radiology Studies: DG CHEST PORT 1 VIEW  Result Date: 04/15/2022 CLINICAL DATA:  N[age] years inpatient with hypoxia. EXAM: PORTABLE CHEST 1 VIEW COMPARISON:  Chest radiograph dated November 10, 2019 FINDINGS: The heart is mildly enlarged. Left basilar opacity suggesting atelectasis or infiltrate. Right lung is clear. Thoracic spondylosis and bilateral glenohumeral and acromioclavicular joint arthritic changes. IMPRESSION: Left basilar opacity suggesting atelectasis or infiltrate. Mild cardiomegaly. No evidence of pulmonary edema or large pleural effusion. Electronically Signed   By: Larose Hires D.O.   On: 04/15/2022 12:51    Scheduled Meds:  feeding supplement (GLUCERNA SHAKE)  237 mL Oral BID BM    insulin aspart  0-9 Units Subcutaneous TID AC & HS   losartan  50 mg Oral Daily   metoprolol succinate  50 mg Oral q AM   rivaroxaban  15 mg Oral Q supper   Continuous Infusions:  ceFEPime (MAXIPIME) IV Stopped (04/15/22 2310)   linezolid (ZYVOX) IV 600 mg (04/16/22 0935)     LOS: 3 days    Time spent: 35 mins  Shawna Clamp, MD [facility]   If 7PM-7AM, please contact night-coverage
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
## 2022-04-16 NOTE — Evaluation (Signed)
Physical Therapy Evaluation Patient Details Name: Jasmine Jenkins MRN: [id number] DOB: 1926/04/13 Today's Date: 04/16/2022  History of Present Illness  Patient is a 88 year old female who presented to the hospital with with RLE edema and pain with 1 week history. patient was found to have cellulitis ,sepsis,acute hypoxic respiratory failure, and AKI. PMH: a fib, DM II, HTN,   Clinical Impression  Pt is a 88 y.o. female with above HPI resulting in the deficits listed below (see PT Problem List). Pt performed sit to stand transfers with MOD A+2 and able to perform step pivot to recliner chair with MIN A+2. Pt lives alone at baseline and is modified independent with mobility and ADLs. Per daughter, family provides food in home that is easy to prepare for pt. Pt's son lives next door, but works during day. Pt currently requires increased assist +2 for all mobility. Recommend short term rehab stay at SNF prior to d/c home alone. Pt will benefit from skilled PT to maximize functional mobility to increase independence.         Recommendations for follow up therapy are one component of a multi-disciplinary discharge planning process, led by the attending physician.  Recommendations may be updated based on patient status, additional functional criteria and insurance authorization.  Follow Up Recommendations Skilled nursing-short term rehab (<3 hours/day) Can patient physically be transported by private vehicle: Yes    Assistance Recommended at Discharge Frequent or constant Supervision/Assistance  Patient can return home with the following  Help with stairs or ramp for entrance;Direct supervision/assist for financial management;Direct supervision/assist for medications management;Assist for transportation;Assistance with cooking/housework;A lot of help with bathing/dressing/bathroom;Two people to help with walking and/or transfers    Equipment Recommendations None recommended by PT  Recommendations  for Other Services       Functional Status Assessment Patient has had a recent decline in their functional status and demonstrates the ability to make significant improvements in function in a reasonable and predictable amount of time.     Precautions / Restrictions Precautions Precautions: Fall Precaution Comments: RLE weeping with blisters Restrictions Weight Bearing Restrictions: No      Mobility  Bed Mobility Overal bed mobility: Needs Assistance Bed Mobility: Supine to Sit     Supine to sit: Min assist, HOB elevated     General bed mobility comments: increased time, intermittent assist for LE prorgession to EOB    Transfers Overall transfer level: Needs assistance Equipment used: Rolling walker (2 wheels) Transfers: Sit to/from Stand, Bed to chair/wheelchair/BSC Sit to Stand: Mod assist, +2 physical assistance, +2 safety/equipment   Step pivot transfers: Min assist, +2 physical assistance, +2 safety/equipment       General transfer comment: MOD A for power up to stand, cues at pelvis for increased hip extension. Able to take side steps and turn hips to chair with cues for sequencing and intermittent assist for RW progression.    Ambulation/Gait                  Stairs            Wheelchair Mobility    Modified Rankin (Stroke Patients Only)       Balance Overall balance assessment: Needs assistance Sitting-balance support: Feet supported, Bilateral upper extremity supported Sitting balance-Leahy Scale: Fair     Standing balance support: Bilateral upper extremity supported, During functional activity, Reliant on assistive device for balance Standing balance-Leahy Scale: Poor  Pertinent Vitals/Pain Pain Assessment Pain Assessment: Faces Faces Pain Scale: Hurts even more Pain Location: pt reports generalized discomfort Pain Descriptors / Indicators: Sore, Discomfort Pain Intervention(s): Limited  activity within patient's tolerance, Monitored during session, Repositioned    Home Living Family/patient expects to be discharged to:: Private residence Living Arrangements: Alone Available Help at Discharge: Family;Available PRN/intermittently Type of Home: House Home Access: Ramped entrance       Home Layout: One level Home Equipment: Agricultural consultant (2 wheels);Cane - single point;Shower seat      Prior Function Prior Level of Function : Independent/Modified Independent             Mobility Comments: daughter present and reports x1 fall this year ADLs Comments: son lives next door, works during day     Higher education careers adviser   Dominant Hand: Right    Extremity/Trunk Assessment   Upper Extremity Assessment Upper Extremity Assessment: Defer to OT evaluation    Lower Extremity Assessment Lower Extremity Assessment: Generalized weakness    Cervical / Trunk Assessment Cervical / Trunk Assessment: Kyphotic  Communication   Communication: HOH  Cognition Arousal/Alertness: Awake/alert Behavior During Therapy: WFL for tasks assessed/performed Overall Cognitive Status: Within Functional Limits for tasks assessed                                 General Comments: daughter present to assist with PLOF/home environment as needed. Pt HOH        General Comments      Exercises     Assessment/Plan    PT Assessment Patient needs continued PT services  PT Problem List Decreased strength;Decreased activity tolerance;Decreased range of motion;Decreased balance;Decreased mobility;Pain       PT Treatment Interventions DME instruction;Gait training;Functional mobility training;Therapeutic activities;Therapeutic exercise;Balance training;Patient/family education    PT Goals (Current goals can be found in the Care Plan section)  Acute Rehab PT Goals Patient Stated Goal: have less pain PT Goal Formulation: With patient/family Time For Goal Achievement:  04/30/22 Potential to Achieve Goals: Good    Frequency Min 3X/week     Co-evaluation               AM-PAC PT "6 Clicks" Mobility  Outcome Measure Help needed turning from your back to your side while in a flat bed without using bedrails?: A Little Help needed moving from lying on your back to sitting on the side of a flat bed without using bedrails?: A Little Help needed moving to and from a bed to a chair (including a wheelchair)?: Total Help needed standing up from a chair using your arms (e.g., wheelchair or bedside chair)?: Total Help needed to walk in hospital room?: Total Help needed climbing 3-5 steps with a railing? : Total 6 Click Score: 10    End of Session Equipment Utilized During Treatment: Gait belt Activity Tolerance: Patient tolerated treatment well Patient left: in chair;with call bell/phone within reach;with chair alarm set;with family/visitor present (handoff to SLP) Nurse Communication: Mobility status PT Visit Diagnosis: Unsteadiness on feet (R26.81);Muscle weakness (generalized) (M62.81);Difficulty in walking, not elsewhere classified (R26.2);Pain Pain - Right/Left: Right Pain - part of body: Leg    Time: 2409-7353 PT Time Calculation (min) (ACUTE ONLY): 23 min   Charges:   PT Evaluation $PT Eval Low Complexity: 1 Low PT Treatments $Therapeutic Activity: 8-22 mins         Lyman Speller PT, DPT  Acute Rehabilitation Services  Office [phone]  04/16/2022, 3:58 PM

## 2022-04-17 ENCOUNTER — Encounter (HOSPITAL_COMMUNITY): Payer: Medicare Other

## 2022-04-17 DIAGNOSIS — L03115 Cellulitis of right lower limb: Secondary | ICD-10-CM | POA: Diagnosis not present

## 2022-04-17 LAB — PHOSPHORUS: Phosphorus: 1.5 mg/dL — ABNORMAL LOW (ref 2.5–4.6)

## 2022-04-17 LAB — CBC
HCT: 37.1 % (ref 36.0–46.0)
Hemoglobin: 11.5 g/dL — ABNORMAL LOW (ref 12.0–15.0)
MCH: 29.3 pg (ref 26.0–34.0)
MCHC: 31 g/dL (ref 30.0–36.0)
MCV: 94.4 fL (ref 80.0–100.0)
Platelets: 162 10*3/uL (ref 150–400)
RBC: 3.93 MIL/uL (ref 3.87–5.11)
RDW: 13.5 % (ref 11.5–15.5)
WBC: 19.6 10*3/uL — ABNORMAL HIGH (ref 4.0–10.5)
nRBC: 0.1 % (ref 0.0–0.2)

## 2022-04-17 LAB — BASIC METABOLIC PANEL
Anion gap: 9 (ref 5–15)
BUN: 18 mg/dL (ref 8–23)
CO2: 28 mmol/L (ref 22–32)
Calcium: 8.8 mg/dL — ABNORMAL LOW (ref 8.9–10.3)
Chloride: 96 mmol/L — ABNORMAL LOW (ref 98–111)
Creatinine, Ser: 0.47 mg/dL (ref 0.44–1.00)
GFR, Estimated: >60 mL/min (ref >60)
Glucose, Bld: 106 mg/dL — ABNORMAL HIGH (ref 70–99)
Potassium: 2.9 mmol/L — ABNORMAL LOW (ref 3.5–5.1)
Sodium: 133 mmol/L — ABNORMAL LOW (ref 135–145)

## 2022-04-17 LAB — MAGNESIUM: Magnesium: 1.9 mg/dL (ref 1.7–2.4)

## 2022-04-17 LAB — GLUCOSE, CAPILLARY
Glucose-Capillary: 118 mg/dL — ABNORMAL HIGH (ref 70–99)
Glucose-Capillary: 261 mg/dL — ABNORMAL HIGH (ref 70–99)
Glucose-Capillary: 313 mg/dL — ABNORMAL HIGH (ref 70–99)
Glucose-Capillary: 240 mg/dL — ABNORMAL HIGH (ref 70–99)

## 2022-04-17 LAB — PROCALCITONIN: Procalcitonin: 0.62 ng/mL

## 2022-04-17 MED ORDER — POTASSIUM PHOSPHATES 15 MMOLE/5ML IV SOLN
30.0000 mmol | Freq: Once | INTRAVENOUS | Status: AC
  Administered 2022-04-17: 30 mmol via INTRAVENOUS
  Filled 2022-04-17: qty 10

## 2022-04-17 MED ORDER — HYDROMORPHONE HCL 1 MG/ML IJ SOLN
1.0000 mg | INTRAMUSCULAR | Status: DC | PRN
  Administered 2022-04-17 – 2022-04-19 (×4): 1 mg via INTRAVENOUS
  Filled 2022-04-17 (×4): qty 1

## 2022-04-17 NOTE — Progress Notes (Signed)
Physical Therapy Treatment Patient Details Name: Jasmine Jenkins MRN: [id number] DOB: 11-18-1925 Today's Date: 04/17/2022   History of Present Illness Patient is a 88 year old female who presented to the hospital with with RLE edema and pain with 1 week history. patient was found to have cellulitis ,sepsis,acute hypoxic respiratory failure, and AKI. PMH: a fib, DM II, HTN,    PT Comments    Pt assisted with getting OOB to recliner.  Pt received tramadol approx 30 min prior to session per family however pt does report some pain with mobilizing.  Pt encouraged OOB to sit in recliner as long as comfortable.  Family reports being uncertain about pt's current wishes however therapist ensured family that PT will assist as pt tolerates and wishes however also would respect pt wishes once firmly made known.  Palliative care consult pending.     Recommendations for follow up therapy are one component of a multi-disciplinary discharge planning process, led by the attending physician.  Recommendations may be updated based on patient status, additional functional criteria and insurance authorization.  Follow Up Recommendations  Skilled nursing-short term rehab (<3 hours/day) Can patient physically be transported by private vehicle: Yes   Assistance Recommended at Discharge Frequent or constant Supervision/Assistance  Patient can return home with the following Help with stairs or ramp for entrance;Direct supervision/assist for financial management;Direct supervision/assist for medications management;Assist for transportation;Assistance with cooking/housework;A lot of help with bathing/dressing/bathroom;Two people to help with walking and/or transfers   Equipment Recommendations  None recommended by PT    Recommendations for Other Services       Precautions / Restrictions Precautions Precautions: Fall Precaution Comments: R LE weeping with blisters Restrictions Weight Bearing Restrictions: No      Mobility  Bed Mobility Overal bed mobility: Needs Assistance Bed Mobility: Supine to Sit     Supine to sit: Min assist, HOB elevated     General bed mobility comments: increased time, intermittent assist for LE progression to EOB    Transfers Overall transfer level: Needs assistance Equipment used: Rolling walker (2 wheels) Transfers: Sit to/from Stand Sit to Stand: Mod assist, +2 physical assistance, +2 safety/equipment   Step pivot transfers: Mod assist, +2 physical assistance, +2 safety/equipment       General transfer comment: assist to rise and steady, cues for hand placement and weight shifting, only able to take small steps over to recliner, R LE significantly weeping (repositioned on pillow once in recliner)    Ambulation/Gait                   Stairs             Wheelchair Mobility    Modified Rankin (Stroke Patients Only)       Balance Overall balance assessment: Needs assistance         Standing balance support: Bilateral upper extremity supported, During functional activity, Reliant on assistive device for balance Standing balance-Leahy Scale: Poor                              Cognition Arousal/Alertness: Awake/alert Behavior During Therapy: WFL for tasks assessed/performed, Flat affect Overall Cognitive Status: Within Functional Limits for tasks assessed                                 General Comments: pt [facility]        Exercises  General Comments        Pertinent Vitals/Pain Pain Assessment Pain Assessment: Faces Faces Pain Scale: Hurts even more Pain Location: pt reports generalized discomfort Pain Descriptors / Indicators: Sore, Discomfort Pain Intervention(s): Monitored during session, Premedicated before session, Repositioned    Home Living                          Prior Function            PT Goals (current goals can now be found in the care plan section) Progress  towards PT goals: Progressing toward goals    Frequency    Min 3X/week      PT Plan Current plan remains appropriate    Co-evaluation              AM-PAC PT "6 Clicks" Mobility   Outcome Measure  Help needed turning from your back to your side while in a flat bed without using bedrails?: A Little Help needed moving from lying on your back to sitting on the side of a flat bed without using bedrails?: A Little Help needed moving to and from a bed to a chair (including a wheelchair)?: Total Help needed standing up from a chair using your arms (e.g., wheelchair or bedside chair)?: Total Help needed to walk in hospital room?: Total Help needed climbing 3-5 steps with a railing? : Total 6 Click Score: 10    End of Session Equipment Utilized During Treatment: Gait belt Activity Tolerance: Patient limited by pain Patient left: in chair;with call bell/phone within reach;with chair alarm set;with family/visitor present Nurse Communication: Mobility status PT Visit Diagnosis: Unsteadiness on feet (R26.81);Muscle weakness (generalized) (M62.81);Difficulty in walking, not elsewhere classified (R26.2)     Time: 9798-9211 PT Time Calculation (min) (ACUTE ONLY): 24 min  Charges:  $Therapeutic Activity: 23-37 mins                    Jannette Spanner PT, DPT Physical Therapist Acute Rehabilitation Services Preferred contact method: Secure Chat Weekend Pager Only: [phone] Office: [place] 04/17/2022, 12:18 PM

## 2022-04-17 NOTE — NC FL2 (Signed)
[place] MEDICAID FL2 LEVEL OF CARE SCREENING TOOL     IDENTIFICATION  Patient Name: Jasmine Jenkins Birthdate: 1925/12/14 Sex: female Admission Date (Current Location): 04/13/2022  [facility] and [place] Number:  Producer, television/film/video and Address:  [facility],  [address] [place], [place] [address]      Provider Number: [id number]  Attending Physician Name and Address:  Cipriano Bunker, MD  Relative Name and Phone Number:  Pippa, Hanif [phone]  [phone]  prather,susan Daughter   [phone]    Current Level of Care: Hospital Recommended Level of Care: Skilled Nursing Facility Prior Approval Number:    Date Approved/Denied:   PASRR Number: [id number]  Discharge Plan: SNF    Current Diagnoses: Patient Active Problem List   Diagnosis Date Noted   Cellulitis of right leg 04/13/2022   AKI (acute kidney injury) (HCC) 04/13/2022   Sepsis due to cellulitis (HCC) 04/13/2022   DM2 (diabetes mellitus, type 2) (HCC) 04/13/2022   Dysphagia 08/30/2017   Esophageal stricture 08/30/2017   CAD, NATIVE VESSEL 01/04/2010   CHEST PAIN-PRECORDIAL 05/27/2009   HYPERTENSION, BENIGN 05/26/2009   Atrial fibrillation (HCC) 05/26/2009   DYSPNEA 05/26/2009   Atherosclerosis of renal artery (HCC) 05/21/2009    Orientation RESPIRATION BLADDER Height & Weight     Self, Situation, Place  Normal Incontinent Weight: 118 lb (53.5 kg) Height:  5\' 3"  (160 cm)  BEHAVIORAL SYMPTOMS/MOOD NEUROLOGICAL BOWEL NUTRITION STATUS      Continent Diet (Regular diet)  AMBULATORY STATUS COMMUNICATION OF NEEDS Skin   Limited Assist Verbally Other (Comment) (Non pressure wound)                       Personal Care Assistance Level of Assistance  Bathing, Feeding, Dressing Bathing Assistance: Limited assistance Feeding assistance: Independent Dressing Assistance: Limited assistance     Functional Limitations Info  Sight, Speech, Hearing Sight Info: Adequate Hearing Info:  Adequate Speech Info: Adequate    SPECIAL CARE FACTORS FREQUENCY  PT (By licensed PT), OT (By licensed OT)     PT Frequency: Minimum 5x a week OT Frequency: Minimum 5x a week            Contractures Contractures Info: Not present    Additional Factors Info  Code Status, Allergies, Insulin Sliding Scale Code Status Info: DNR Allergies Info: Fluzone Quadrivalent (Influenza Vac Split Quad)   Penicillins   Amiodarone   Codeine   Coumarin   Fosamax (Alendronate)   Hydrocodone-acetaminophen   Lotensin (Benazepril)   Metformin And Related   Norvasc (Amlodipine)   Insulin Sliding Scale Info: insulin aspart (novoLOG) injection 0-9 Units 3x a day with meals.       Current Medications (04/17/2022):  This is the current hospital active medication list Current Facility-Administered Medications  Medication Dose Route Frequency Provider Last Rate Last Admin   acetaminophen (TYLENOL) tablet 650 mg  650 mg Oral Q6H PRN 04/19/2022, DO   650 mg at 04/17/22 0500   Or   acetaminophen (TYLENOL) suppository 650 mg  650 mg Rectal Q6H PRN 04/19/22, DO       feeding supplement (GLUCERNA SHAKE) (GLUCERNA SHAKE) liquid 237 mL  237 mL Oral BID BM Hillary Bow, MD       furosemide (LASIX) injection 40 mg  40 mg Intravenous Daily PRN Cipriano Bunker, MD   40 mg at 04/15/22 1610   insulin aspart (novoLOG) injection 0-9 Units  0-9 Units Subcutaneous TID AC & HS  Shawna Clamp, MD   5 Units at 04/17/22 1220   linezolid (ZYVOX) tablet 600 mg  600 mg Oral Q12H Shawna Clamp, MD   600 mg at 04/17/22 0916   losartan (COZAAR) tablet 50 mg  50 mg Oral Daily Shawna Clamp, MD   50 mg at 04/17/22 0915   metoprolol succinate (TOPROL-XL) 24 hr tablet 50 mg  50 mg Oral q AM Shawna Clamp, MD   50 mg at 04/17/22 0645   metoprolol tartrate (LOPRESSOR) injection 5 mg  5 mg Intravenous Q6H PRN Shawna Clamp, MD       ondansetron [facility]) tablet 4 mg  4 mg Oral Q6H PRN Etta Quill, DO       Or    ondansetron [facility]) injection 4 mg  4 mg Intravenous Q6H PRN Etta Quill, DO   4 mg at 04/15/22 1333   polyethylene glycol (MIRALAX / GLYCOLAX) packet 17 g  17 g Oral Daily PRN Etta Quill, DO       potassium PHOSPHATE 30 mmol in dextrose 5 % 500 mL infusion  30 mmol Intravenous Once Shawna Clamp, MD 85 mL/hr at 04/17/22 0919 30 mmol at 04/17/22 0919   Rivaroxaban (XARELTO) tablet 15 mg  15 mg Oral Q supper Shawna Clamp, MD   15 mg at 04/16/22 1712   traMADol (ULTRAM) tablet 50 mg  50 mg Oral Q8H PRN Shawna Clamp, MD   50 mg at 04/17/22 0944   Facility-Administered Medications Ordered in Other Encounters  Medication Dose Route Frequency Provider Last Rate Last Admin   esmolol (BREVIBLOC) injection   Intravenous Anesthesia Intra-op Barrington Ellison, CRNA   30 mg at 11/15/19 1019     Discharge Medications: Please see discharge summary for a list of discharge medications.  Relevant Imaging Results:  Relevant Lab Results:   Additional Information SSN [id number]  Ross Ludwig, LCSW

## 2022-04-17 NOTE — Progress Notes (Signed)
PROGRESS NOTE    Jasmine Jenkins  HQI:[id number] DOB: 10-26-1925 DOA: 04/13/2022 PCP: Kirby Funk, MD   Brief Narrative:  This 88 years old female with PMH significant for type 2 diabetes, hypertension, RAS, history of strep sepsis presented in the ED with complaints of right leg pain and swelling for last 1 week.  Patient has gone to her primary care physician and has been receiving IM ceftriaxone daily,  Patient reports persistent pain and swelling has worsened, PCP has been checking her CBC daily which shows increasing white cell count.  Patient was sent from PCP for admission. Patient is admitted for right leg cellulitis and started on IV antibiotics.  Assessment & Plan:   Principal Problem:   Cellulitis of right leg Active Problems:   AKI (acute kidney injury) (HCC)   Sepsis due to cellulitis (HCC)   Atrial fibrillation (HCC)   HYPERTENSION, BENIGN   DM2 (diabetes mellitus, type 2) (HCC)  Right lower extremity cellulitis: > Improving Patient presented with worsening pain, swelling and redness in the right lower extremity.   Patient failed outpatient antibiotics treatment. CT shows no subcutaneous air, fluid-filled bulla is noted. Initiated IV antibiotics (vancomycin and cefepime). Orthopedic consulted for possible necrotizing fasciitis, It was ruled out. Ortho recommended cellulitis is improving.  Recommended change to oral antibiotics next day   Leukocytosis is improving.  Redness has significantly improved. Antibiotics changed to linezolid. She started draining from blisters, happened as a result of shear force while moving on the bed.  Sepsis due to cellulitis: Patient meets sepsis criteria at the time of admission (tachycardia, leukocytosis, lactic acid 1.7.  RLE cellulitis) Continue IV hydration Continue antibiotics, blood cultures no growth so far. Sepsis physiology improving  Acute kidney injury: > Resolved. Likely prerenal ATN in the setting of cellulitis. Held  losartan, continue IV hydration.  Monitor renal functions. Renal functions improved and back to normal. 1.81> 1.32> 0.93>0.66  Atrial fibrillation: Xarelto was on hold in the setting of AKI and started on heparin gtt.  Continue metoprolol for heart rate control. Heparin discontinued,  resumed on Xarelto.  Diabetes mellitus type 2 Hold p.o. diabetic medications Continue regular insulin sliding scale  Essential hypertension: Losartan resumed. Continue metoprolol for hypertension. BP controlled.  Hypokalemia: Replaced.  Continue to monitor.  Acute hypoxic respiratory failure: > Resolved. Patient became short of breath, was placed on 2 L of supplemental oxygen. Chest x-ray shows infiltrate versus atelectasis.  Antibiotics changed to linezolid. Weaned down to room air.   Peripheral vascular disease: Patient found to have decreased circulation, feeble pulses distally. Vascular surgery consulted.  Recommended continue antibiotics and elevation of her legs and outpatient follow-up with right lower extremity arterial duplex and ABI when cellulitis resolves.  DVT prophylaxis: Xarelto Code Status: DNR Family Communication: Jasmine Jenkins at bed side Disposition Plan:   Status is: Inpatient Remains inpatient appropriate because: Admitted for RLE cellulitis,  failed outpatient treatment requiring IV antibiotics.. PT and OT recommended SNF.  Anticipated discharge in 1-2 days.    Consultants:  Orthopeadics  Procedures: CT   Antimicrobials:  Anti-infectives (From admission, onward)    Start     Dose/Rate Route Frequency Ordered Stop   04/16/22 2200  linezolid (ZYVOX) tablet 600 mg        600 mg Oral Every 12 hours 04/16/22 1435     04/16/22 1800  ceFEPIme (MAXIPIME) 2 g in sodium chloride 0.9 % 100 mL IVPB  Status:  Discontinued        2 g 200  mL/hr over 30 Minutes Intravenous Every 12 hours 04/16/22 1437 04/17/22 1304   04/15/22 1200  linezolid (ZYVOX) IVPB 600 mg  Status:  Discontinued         600 mg 300 mL/hr over 60 Minutes Intravenous Every 12 hours 04/15/22 1100 04/16/22 1435   04/13/22 2200  ceFEPIme (MAXIPIME) 2 g in sodium chloride 0.9 % 100 mL IVPB  Status:  Discontinued        2 g 200 mL/hr over 30 Minutes Intravenous Every 24 hours 04/13/22 2120 04/16/22 1437   04/13/22 2118  vancomycin variable dose per unstable renal function (pharmacist dosing)  Status:  Discontinued         Does not apply See admin instructions 04/13/22 2118 04/15/22 1059   04/13/22 1900  vancomycin (VANCOCIN) IVPB 1000 mg/200 mL premix        1,000 mg 200 mL/hr over 60 Minutes Intravenous  Once 04/13/22 1859 04/13/22 2051   04/13/22 1845  cefTRIAXone (ROCEPHIN) 1 g in sodium chloride 0.9 % 100 mL IVPB        1 g 200 mL/hr over 30 Minutes Intravenous  Once 04/13/22 1841 04/13/22 1939      Subjective: Patient was seen and examined at bedside.  Overnight events noted. Patient reports having worsening pain, continued on pain medications. Redness in the legs has improved with antibiotics. Bluish discoloration in the fingers has resolved.  She is weaned down to room air.  Objective: Vitals:   04/16/22 1915 04/16/22 2200 04/17/22 0445 04/17/22 1320  BP:  136/62 106/63 (!) 101/46  Pulse:  69 96 64  Resp:  18 20 16   Temp:  97.6 F (36.4 C) 97.6 F (36.4 C) (!) 97.3 F (36.3 C)  TempSrc:  Oral Oral Oral  SpO2: 97% 97% 95% 99%  Weight:      Height:        Intake/Output Summary (Last 24 hours) at 04/17/2022 1327 Last data filed at 04/17/2022 1140 Gross per 24 hour  Intake 340 ml  Output 550 ml  Net -210 ml   Filed Weights   04/13/22 1847  Weight: 53.5 kg    Examination:  General exam: Appears comfortable, not in any acute distress, deconditioned. Respiratory system: CTA bilaterally, respiratory effort normal, RR 16 Cardiovascular system: S1-S2 heard, irregular rhythm. Gastrointestinal system: Abdomen is soft, non tender, non distended, BS+ Central nervous system: Alert and  oriented x 3 . No focal neurological deficits. Extremities: RLE erythema is improving, fluid-filled bulla started draining. Skin: No rashes, lesions or ulcers Psychiatry: Judgement and insight appear normal. Mood & affect appropriate.     Data Reviewed: I have personally reviewed following labs and imaging studies  CBC: Recent Labs  Lab 04/13/22 1728 04/14/22 0751 04/15/22 0546 04/16/22 0520 04/17/22 0432  WBC 20.8* 18.7* 16.9* 16.1* 19.6*  NEUTROABS 19.1*  --   --   --   --   HGB 13.5 11.2* 12.0 12.4 11.5*  HCT 42.5 35.3* 36.1 38.8 37.1  MCV 92.8 92.4 90.0 91.9 94.4  PLT 186 171 166 186 162   Basic Metabolic Panel: Recent Labs  Lab 04/13/22 1728 04/14/22 0751 04/15/22 0546 04/16/22 0520 04/17/22 0432  NA 130* 133* 135  --  133*  K 3.7 3.1* 3.7  --  2.9*  CL 92* 99 101  --  96*  CO2 21* 25 27  --  28  GLUCOSE 254* 89 189*  --  106*  BUN 81* 78* 51*  --  18  CREATININE  1.81* 1.38* 0.93 0.60 0.47  CALCIUM 9.9 9.0 9.3  --  8.8*  MG  --   --  2.1  --  1.9  PHOS  --   --  2.3*  --  1.5*   GFR: Estimated Creatinine Clearance: 35.6 mL/min (by C-G formula based on SCr of 0.47 mg/dL). Liver Function Tests: Recent Labs  Lab 04/14/22 0751  AST 15  ALT 15  ALKPHOS 45  BILITOT 1.1  PROT 5.5*  ALBUMIN 2.4*   No results for input(s): "LIPASE", "AMYLASE" in the last 168 hours. No results for input(s): "AMMONIA" in the last 168 hours. Coagulation Profile: No results for input(s): "INR", "PROTIME" in the last 168 hours. Cardiac Enzymes: No results for input(s): "CKTOTAL", "CKMB", "CKMBINDEX", "TROPONINI" in the last 168 hours. BNP (last 3 results) No results for input(s): "PROBNP" in the last 8760 hours. HbA1C: No results for input(s): "HGBA1C" in the last 72 hours.  CBG: Recent Labs  Lab 04/16/22 1140 04/16/22 1642 04/16/22 2157 04/17/22 0721 04/17/22 1152  GLUCAP 161* 217* 258* 118* 261*   Lipid Profile: No results for input(s): "CHOL", "HDL", "LDLCALC",  "TRIG", "CHOLHDL", "LDLDIRECT" in the last 72 hours. Thyroid Function Tests: No results for input(s): "TSH", "T4TOTAL", "FREET4", "T3FREE", "THYROIDAB" in the last 72 hours. Anemia Panel: No results for input(s): "VITAMINB12", "FOLATE", "FERRITIN", "TIBC", "IRON", "RETICCTPCT" in the last 72 hours. Sepsis Labs: Recent Labs  Lab 04/13/22 1840 04/13/22 2040 04/17/22 0432  PROCALCITON  --   --  0.62  LATICACIDVEN 1.7 1.4  --     Recent Results (from the past 240 hour(s))  Culture, blood (routine x 2)     Status: None (Preliminary result)   Collection Time: 04/13/22  6:59 PM   Specimen: BLOOD  Result Value Ref Range Status   Specimen Description   Final    BLOOD RIGHT ANTECUBITAL Performed at [facility], [address], [place], [place] [address]    Special Requests   Final    BOTTLES DRAWN AEROBIC AND ANAEROBIC Blood Culture results may not be optimal due to an inadequate volume of blood received in culture bottles Performed at [facility], [address] [place], [place] [address]    Culture   Final    NO GROWTH 4 DAYS Performed at [facility], [address] [place], [place] [address]    Report Status PENDING  Incomplete  Culture, blood (routine x 2)     Status: None (Preliminary result)   Collection Time: 04/13/22  6:59 PM   Specimen: BLOOD  Result Value Ref Range Status   Specimen Description   Final    BLOOD LEFT ANTECUBITAL Performed at [facility], [address], [place], [place] [address]    Special Requests   Final    BOTTLES DRAWN AEROBIC ONLY Blood Culture results may not be optimal due to an inadequate volume of blood received in culture bottles Performed at [facility], [address] [place], [place] [address]    Culture   Final    NO GROWTH 4 DAYS Performed at [facility], [address], [place], [place] [address]    Report Status PENDING  Incomplete     Radiology Studies: No results found.  Scheduled Meds:  feeding supplement (GLUCERNA SHAKE)  237 mL Oral BID BM   insulin aspart  0-9 Units Subcutaneous TID AC & HS   linezolid  600 mg Oral Q12H   losartan  50 mg Oral Daily   metoprolol succinate  50 mg Oral q AM   rivaroxaban  15 mg Oral Q supper   Continuous Infusions:  potassium PHOSPHATE IVPB (in mmol) 30 mmol (04/17/22 0919)     LOS: 4 days    Time spent: 35 mins    Lorriann Hansmann, MD [facility]   If 7PM-7AM, please contact night-coverage

## 2022-04-17 NOTE — Care Management Important Message (Signed)
Important Message  Patient Details IM Letter placed in Patients room Name: Jasmine Jenkins MRN: [id number] Date of Birth: March 24, 1926   Medicare Important Message Given:  Yes     Kerin Salen 04/17/2022, 1:05 PM

## 2022-04-17 NOTE — TOC Initial Note (Addendum)
Transition of Care [facility]) - Initial/Assessment Note    Patient Details  Name: Jasmine Jenkins MRN: [id number] Date of Birth: 1925-12-02  Transition of Care [facility]) CM/SW Contact:    Ross Ludwig, LCSW Phone Number: 04/17/2022, 1:36 PM  Clinical Narrative:                  CSW spoke to patient's son Herbie Baltimore to discuss PT recommendations for SNF placement.  Per patient's son she has been to [facility] in the past.  Patient and family would like to go to [facility] if a bed is available and insurance has been approved.  Patient lives alone, husband just died last week.  Patient and family will be meeting with palliative as well to discuss goals of care.  CSW was given permission to begin bed search in [facility].  CSW also contacted [facility] to have them review patient's information.  CSW to continue to follow patient's progress throughout discharge planning.  CSW provided list of SNFs and ratings from Medicare.gov to patient's son to review in case [facility] is not able to accept patient.  4:55pm  [facility] PG offered bed for patient, CSW presented bed offers, and family chose [facility].  TOC to start insurance auth once patient is closer to being ready for discharge.  Expected Discharge Plan: Skilled Nursing Facility Barriers to Discharge: Continued Medical Work up   Patient Goals and CMS Choice Patient states their goals for this hospitalization and ongoing recovery are:: To return back home with home health after rehab. CMS Medicare.gov Compare Post Acute Care list provided to:: Patient Represenative (must comment) Choice offered to / list presented to : Adult Children  Expected Discharge Plan and Services Expected Discharge Plan: [place] Choice: [place] arrangements for the past 2 months: Single Family Home                                      Prior Living Arrangements/Services Living arrangements for  the past 2 months: Single Family Home Lives with:: Self Patient language and need for interpreter reviewed:: Yes Do you feel safe going back to the place where you live?: No   Patient and family feel she needs rehab first before returning back home.  Need for Family Participation in Patient Care: Yes (Comment) Care giver support system in place?: No (comment)   Criminal Activity/Legal Involvement Pertinent to Current Situation/Hospitalization: No - Comment as needed  Activities of Daily Living Home Assistive Devices/Equipment: Hearing aid, Eyeglasses, Walker (specify type), Shower chair with back, Wheelchair (bilateral hearing aides, reading glasses) ADL Screening (condition at time of admission) Patient's cognitive ability adequate to safely complete daily activities?: Yes Is the patient deaf or have difficulty hearing?: Yes Does the patient have difficulty seeing, even when wearing glasses/contacts?: No Does the patient have difficulty concentrating, remembering, or making decisions?: Yes (at times) Patient able to express need for assistance with ADLs?: Yes Does the patient have difficulty dressing or bathing?: Yes Independently performs ADLs?: No Communication: Independent Dressing (OT): Needs assistance Is this a change from baseline?: Change from baseline, expected to last >3 days Grooming: Independent Feeding: Independent Bathing: Needs assistance Is this a change from baseline?: Pre-admission baseline Toileting: Needs assistance Is this a change from baseline?: Change from baseline, expected to last >3days In/Out Bed: Needs assistance Is this a change from baseline?:  Change from baseline, expected to last >3 days Walks in Home: Needs assistance Is this a change from baseline?: Change from baseline, expected to last >3 days Does the patient have difficulty walking or climbing stairs?: Yes Weakness of Legs: Both Weakness of Arms/Hands: Both  Permission  Sought/Granted Permission sought to share information with : Case Manager, Magazine features editor, Family Supports Permission granted to share information with : Yes, Release of Information Signed  Share Information with NAME: Nakiea, Metzner [phone]  [phone]  prather,susan Daughter   [phone]  Permission granted to share info w AGENCY: SNF admissions        Emotional Assessment Appearance:: Appears stated age Attitude/Demeanor/Rapport: Other (comment) (Confused) Affect (typically observed): Adaptable, Appropriate, Calm, Pleasant Orientation: : Oriented to Self Alcohol / Substance Use: Not Applicable Psych Involvement: No (comment)  Admission diagnosis:  Weakness [R53.1] Cellulitis of right leg [L03.115] Cellulitis of right lower extremity [L03.115] Acute kidney injury (HCC) [N17.9] Sepsis, due to unspecified organism, unspecified whether acute organ dysfunction present [facility]) [A41.9] Patient Active Problem List   Diagnosis Date Noted   Cellulitis of right leg 04/13/2022   AKI (acute kidney injury) (HCC) 04/13/2022   Sepsis due to cellulitis (HCC) 04/13/2022   DM2 (diabetes mellitus, type 2) (HCC) 04/13/2022   Dysphagia 08/30/2017   Esophageal stricture 08/30/2017   CAD, NATIVE VESSEL 01/04/2010   CHEST PAIN-PRECORDIAL 05/27/2009   HYPERTENSION, BENIGN 05/26/2009   Atrial fibrillation (HCC) 05/26/2009   DYSPNEA 05/26/2009   Atherosclerosis of renal artery (HCC) 05/21/2009   PCP:  Kirby Funk, MD Pharmacy:   [facility] [id number]),  [address] [place] ([place]) [place] [address] Phone: [phone] Fax: [phone]     Social Determinants of Health (SDOH) Interventions    Readmission Risk Interventions     No data to display

## 2022-04-18 ENCOUNTER — Inpatient Hospital Stay (HOSPITAL_COMMUNITY): Payer: Medicare Other

## 2022-04-18 DIAGNOSIS — Z7189 Other specified counseling: Secondary | ICD-10-CM | POA: Diagnosis not present

## 2022-04-18 DIAGNOSIS — Z515 Encounter for palliative care: Secondary | ICD-10-CM | POA: Diagnosis not present

## 2022-04-18 DIAGNOSIS — L03115 Cellulitis of right lower limb: Secondary | ICD-10-CM | POA: Diagnosis not present

## 2022-04-18 LAB — URINALYSIS, ROUTINE W REFLEX MICROSCOPIC
Bilirubin Urine: NEGATIVE
Glucose, UA: >=500 mg/dL — AB
Hgb urine dipstick: NEGATIVE
Ketones, ur: NEGATIVE mg/dL
Leukocytes,Ua: NEGATIVE
Nitrite: NEGATIVE
Protein, ur: 100 mg/dL — AB
Specific Gravity, Urine: 1.021 (ref 1.005–1.030)
pH: 5 (ref 5.0–8.0)

## 2022-04-18 LAB — CBC
HCT: 35.7 % — ABNORMAL LOW (ref 36.0–46.0)
Hemoglobin: 11.4 g/dL — ABNORMAL LOW (ref 12.0–15.0)
MCH: 28.9 pg (ref 26.0–34.0)
MCHC: 31.9 g/dL (ref 30.0–36.0)
MCV: 90.4 fL (ref 80.0–100.0)
Platelets: 209 10*3/uL (ref 150–400)
RBC: 3.95 MIL/uL (ref 3.87–5.11)
RDW: 13.6 % (ref 11.5–15.5)
WBC: 23 10*3/uL — ABNORMAL HIGH (ref 4.0–10.5)
nRBC: 0 % (ref 0.0–0.2)

## 2022-04-18 LAB — SEDIMENTATION RATE: Sed Rate: 41 mm/hr — ABNORMAL HIGH (ref 0–22)

## 2022-04-18 LAB — MAGNESIUM: Magnesium: 1.6 mg/dL — ABNORMAL LOW (ref 1.7–2.4)

## 2022-04-18 LAB — BASIC METABOLIC PANEL
Anion gap: 8 (ref 5–15)
BUN: 17 mg/dL (ref 8–23)
CO2: 32 mmol/L (ref 22–32)
Calcium: 8.8 mg/dL — ABNORMAL LOW (ref 8.9–10.3)
Chloride: 95 mmol/L — ABNORMAL LOW (ref 98–111)
Creatinine, Ser: 0.56 mg/dL (ref 0.44–1.00)
GFR, Estimated: >60 mL/min (ref >60)
Glucose, Bld: 98 mg/dL (ref 70–99)
Potassium: 2.9 mmol/L — ABNORMAL LOW (ref 3.5–5.1)
Sodium: 135 mmol/L (ref 135–145)

## 2022-04-18 LAB — CULTURE, BLOOD (ROUTINE X 2)
Culture: NO GROWTH
Culture: NO GROWTH

## 2022-04-18 LAB — GLUCOSE, CAPILLARY
Glucose-Capillary: 76 mg/dL (ref 70–99)
Glucose-Capillary: 158 mg/dL — ABNORMAL HIGH (ref 70–99)

## 2022-04-18 LAB — C-REACTIVE PROTEIN: CRP: 18.4 mg/dL — ABNORMAL HIGH (ref <1.0)

## 2022-04-18 LAB — PHOSPHORUS: Phosphorus: 2.2 mg/dL — ABNORMAL LOW (ref 2.5–4.6)

## 2022-04-18 MED ORDER — POTASSIUM CHLORIDE 20 MEQ PO PACK
40.0000 meq | PACK | Freq: Once | ORAL | Status: AC
  Administered 2022-04-18: 40 meq via ORAL
  Filled 2022-04-18: qty 2

## 2022-04-18 MED ORDER — POTASSIUM CHLORIDE 10 MEQ/100ML IV SOLN
10.0000 meq | INTRAVENOUS | Status: AC
  Administered 2022-04-18 (×2): 10 meq via INTRAVENOUS
  Filled 2022-04-18 (×2): qty 100

## 2022-04-18 MED ORDER — ORAL CARE MOUTH RINSE: 15.0000 mL | OROMUCOSAL | Status: DC | PRN

## 2022-04-18 MED ORDER — MAGNESIUM SULFATE 2 GM/50ML IV SOLN
2.0000 g | Freq: Once | INTRAVENOUS | Status: AC
  Administered 2022-04-18: 2 g via INTRAVENOUS
  Filled 2022-04-18: qty 50

## 2022-04-18 MED ORDER — GLYCOPYRROLATE 1 MG PO TABS: 1.0000 mg | ORAL_TABLET | ORAL | Status: DC | PRN

## 2022-04-18 MED ORDER — GLYCOPYRROLATE 0.2 MG/ML IJ SOLN: 0.2000 mg | INTRAMUSCULAR | Status: DC | PRN

## 2022-04-18 MED ORDER — POLYVINYL ALCOHOL 1.4 % OP SOLN: 1.0000 [drp] | Freq: Four times a day (QID) | OPHTHALMIC | Status: DC | PRN

## 2022-04-18 MED ORDER — BIOTENE DRY MOUTH MT LIQD: 15.0000 mL | OROMUCOSAL | Status: DC | PRN

## 2022-04-18 NOTE — Progress Notes (Signed)
Per notes, patient transitioned to comfort care ID will sign off. Please call with questions    Rosiland Oz, MD Infectious Disease Physician [facility] [address] [place], [place] [address] Phone: [phone]  Fax: [phone]

## 2022-04-18 NOTE — Inpatient Diabetes Management (Signed)
Inpatient Diabetes Program Recommendations  AACE/ADA: New Consensus Statement on Inpatient Glycemic Control (2015)  Target Ranges:  Prepandial:   less than 140 mg/dL      Peak postprandial:   less than 180 mg/dL (1-2 hours)      Critically ill patients:  140 - 180 mg/dL   Lab Results  Component Value Date   GLUCAP 76 04/18/2022   HGBA1C 8.9 (H) 04/13/2022    Review of Glycemic Control  Latest Reference Range & Units 04/17/22 07:21 04/17/22 11:52 04/17/22 17:00 04/17/22 21:19 04/18/22 07:31  Glucose-Capillary 70 - 99 mg/dL 118 (H) 261 (H) 313 (H) 240 (H) 76  (H): Data is abnormally high  Diabetes history:  DM2  Outpatient Diabetes medications:  Amaryl 4 mg QD  Current orders for Inpatient glycemic control:  Novolog 0-9 units TID and HS  Inpatient Diabetes Program Recommendations:    Postprandials elevated; please consider: Novolog 3 units TID with meals IF consumes at least 50%.  Will continue to follow while inpatient.  Thank you, Reche Dixon, MSN, [place] Diabetes Coordinator Inpatient Diabetes Program [phone] (team pager from 8a-5p)

## 2022-04-18 NOTE — TOC Progression Note (Addendum)
Transition of Care [facility]) - Progression Note    Patient Details  Name: Jasmine Jenkins MRN: [id number] Date of Birth: [birth year]  Transition of Care [facility]) CM/SW Contact  Myleigh Amara, Juliann Pulse, RN Phone Number: 04/18/2022, 3:38 PM  Clinical Narrative: Will await clarification of ST SNF w/otpt Palliative Care Services vs Residential hospice by new referral.  -referral for residential hospice-provided Herbie Baltimore son w/list-await choice-he also agreed to allowing [facility] rep to contact him. Await choice.    Expected Discharge Plan:  (TBD) Barriers to Discharge: Continued Medical Work up  Expected Discharge Plan and Services Expected Discharge Plan:  (TBD)     Post Acute Care Choice: [place] Living arrangements for the past 2 months: Single Family Home                                       Social Determinants of Health (SDOH) Interventions    Readmission Risk Interventions     No data to display

## 2022-04-18 NOTE — Progress Notes (Signed)
Chaplain attempted to meet with Jasmine Jenkins, but her son said that afternoon might be better.  Chaplain encountered Jasmine Jenkins's daughter in the hallway and provided grief support after the loss of her father last Wednesday. Chaplains will attempt follow up later this afternoon.    [address] [place] Pager, [phone]

## 2022-04-18 NOTE — Progress Notes (Signed)
Report received from K. Seay,RN. Patient resting, family at bedside. No change from previous assessment. Jasmine Jenkins, Laurel Dimmer, RN

## 2022-04-18 NOTE — Care Plan (Signed)
Received message from palliative care.   Family has transitioned care to comfort care.
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
## 2022-04-18 NOTE — Progress Notes (Signed)
   Palliative Medicine Inpatient Follow Up Note  I met with patients children Herbie [place] and Manuela Schwartz this afternoon. They were joined by patients Education officer, museum, Hassan Rowan. Per conversation held patient transitioned her focus. She vocalized the desire to be comfortable and no longer fight. We discussed what this meant in the larger scheme of things. We reviewed stopping invasive measures/IV antibiotics/ lab draws. Discussed only focusing on symptom relief.   Reviewed that it is unclear in terms of a time frame though, likely limited once antibiotics are stopped.   I shared we would monitor Soraida in the setting of her symptoms.   Plan for comfort care and inpatient hospice. __________________________________________ Addendum:  I met with Jolee Ewing again this evening.  We solidified the plan for comfort care and transition to an inpatient hospice - Adreanne was not alert at this time as she had received dilaudid.  We are all in agreement that this will be the best place to suit Yazleemar's needs.  Questions and concerns addressed.  Additional time: 72  SUMMARY OF RECOMMENDATIONS   DNAR/DNI  Comfort Care  Comfort medications per MAR  TOC --> Transition to inpatient hospice family has vocalized they will [facility]  Ongoing palliative care support until discharge ______________________________________________________________________________________ [facility] Team Team Cell Phone: [phone] Please utilize secure chat with additional questions, if there is no response within 30 minutes please call the above phone number  Palliative Medicine Team providers are available by phone from 7am to 7pm daily and can be reached through the team cell phone.  Should this patient require assistance outside of these hours, please call the patient's attending physician.

## 2022-04-18 NOTE — Consult Note (Addendum)
[facility]                                                                                       Patient Identification: Patient Name: Jasmine Jenkins MRN: [id number] Admit Date: 04/13/2022  4:54 PM Today's Date: 04/18/2022 Reason for consult: RLE cellulitis  Requesting provider: Cipriano Bunker  Principal Problem:   Cellulitis of right leg Active Problems:   HYPERTENSION, BENIGN   Atrial fibrillation (HCC)   AKI (acute kidney injury) (HCC)   Sepsis due to cellulitis (HCC)   DM2 (diabetes mellitus, type 2) (HCC)   Antibiotics:  Cefepime 9/14-9/17 Ceftriaxone 9/14 Linezolid 9/18-c  Lines/Hardware: PIV, RT IM nail  Assessment 41 Y O female with PMH of DM, HTN, CKD,  chronic bilateral leg swelling,with possible PVD per vascular, streptococcal sepsis, RAS, PAF on AC, GERD, Diverticulosis.  Chronic back pain/arthritis who presented to ED on 9/14 with rt leg pain associated with swelling for a week. Admitted for cellulitis with failed OP tx with IM ceftriaxone * 3 doses. CT 9/14 with Diffuse skin thickening and subcutaneous soft tissue swelling of the right lower leg and foot, as can be seen in cellulitis or ymphedema. Blister-like skin lesions of the distal lower leg. No well-defined/drainable fluid collection on noncontrast CT. No acute osseous abnormality. No soft tissue gas.  Recommendations  Continue linezolid as is, cellulitis in rt leg seems to be improving. Appears to be streptococcal cellulitis in appearance Repeat CBC tomorrow Will follow closely to see continued improvement  Do not think additional GN coverage is needed for leukocytosis alone with continued clinical improvement  Following Discussed with patient's son, daughter and ID pharm D  Rest of the management as per the primary team. Please call with questions or concerns.  Thank you for the consult  Odette Fraction,  MD Infectious Disease Physician [facility] [address] [place], [place] [address] Phone: [phone]  Fax: [phone]  __________________________________________________________________________________________________________ HPI and Hospital Course: 88 Y O female with PMH of DM, HTN, CKD,  chronic bilateral leg swelling,with possible PVD per vascular, streptococcal sepsis, RAS, PAF on AC, GERD, Diverticulosis.  Chronic back pain/arthritis who presented to ED on 9/14 with rt leg pain associated with swelling for a week. She was receiving IM ceftriaxone with her PCP for last 3 days with persistent pain and swelling with increasing WBCs each day. Denies fevers, chills. Denies nausea, vomiting and diarrhea.   Son and daughter at bedside. Per son, patient had possible rt leg infection with streptococcus 20 years ago. Son denies frequent cellulitis of b/l legs and they can't remember the last episode.  Daughter says she had not smokes for 60 years, denies alcohol   At ED, afebrile, WBC up to 20.8 , AKI with Cr with 1.8 9/14 Blood cx 2/2 sets no growth   Imaging as below   Seen by Orthopedics, unlikely NF Seen by Vascular, evidence of tibial arterial occlusive disease and OP fu recommended.  Palliative care following  ROS: all systems reviewed with pertinent positives and negatives as listed above  Past Medical History:  Diagnosis  Date   Arthritis    Chronic back pain    Chronic kidney disease    Diabetes (HCC)    Diverticulosis    GERD (gastroesophageal reflux disease)    H/O hiatal hernia    Hypertension    Lower extremity edema    Mild pulmonary hypertension (HCC)    Peripheral vascular disease (HCC)    Persistent atrial fibrillation (HCC)    PONV (postoperative nausea and vomiting)    when she is completely put under   Renal artery stenosis (HCC)    Streptococcal sepsis [facility])    Past Surgical History:  Procedure  Laterality Date   BACK SURGERY     BALLOON DILATION N/A 09/27/2017   Procedure: BALLOON DILATION;  Surgeon: Charlott Rakes, MD;  Location: [facility] ENDOSCOPY;  Service: Endoscopy;  Laterality: N/A;   BALLOON DILATION N/A 11/17/2019   Procedure: BALLOON DILATION;  Surgeon: Charlott Rakes, MD;  Location: [facility] ENDOSCOPY;  Service: Endoscopy;  Laterality: N/A;   CARDIAC CATHETERIZATION     CARDIOVERSION  11/29/2011   Procedure: CARDIOVERSION;  Surgeon: Corky Crafts, MD;  Location: [facility] OR;  Service: Cardiovascular;  Laterality: N/A;   CARDIOVERSION N/A 10/02/2012   Procedure: CARDIOVERSION;  Surgeon: Corky Crafts, MD;  Location: [facility] ENDOSCOPY;  Service: Cardiovascular;  Laterality: N/A;   ESOPHAGOGASTRODUODENOSCOPY (EGD) WITH PROPOFOL N/A 08/24/2016   Procedure: ESOPHAGOGASTRODUODENOSCOPY (EGD) WITH PROPOFOL;  Surgeon: Dorena Cookey, MD;  Location: [facility] ENDOSCOPY;  Service: Endoscopy;  Laterality: N/A;   ESOPHAGOGASTRODUODENOSCOPY (EGD) WITH PROPOFOL N/A 09/27/2017   Procedure: ESOPHAGOGASTRODUODENOSCOPY (EGD) WITH PROPOFOL;  Surgeon: Charlott Rakes, MD;  Location: [facility] ENDOSCOPY;  Service: Endoscopy;  Laterality: N/A;   ESOPHAGOGASTRODUODENOSCOPY (EGD) WITH PROPOFOL N/A 11/17/2019   Procedure: ESOPHAGOGASTRODUODENOSCOPY (EGD) WITH PROPOFOL;  Surgeon: Charlott Rakes, MD;  Location: [facility] ENDOSCOPY;  Service: Endoscopy;  Laterality: N/A;   HERNIA REPAIR     INTRAMEDULLARY (IM) NAIL INTERTROCHANTERIC Right 11/11/2019   Procedure: INTRAMEDULLARY (IM) NAIL INTERTROCHANTRIC;  Surgeon: Terance Hart, MD;  Location: [facility] OR;  Service: Orthopedics;  Laterality: Right;   SAVORY DILATION N/A 08/24/2016   Procedure: SAVORY DILATION;  Surgeon: Dorena Cookey, MD;  Location: [facility] ENDOSCOPY;  Service: Endoscopy;  Laterality: N/A;   SAVORY DILATION N/A 09/27/2017   Procedure: SAVORY DILATION;  Surgeon: Charlott Rakes, MD;  Location: [facility] ENDOSCOPY;  Service: Endoscopy;  Laterality: N/A;     Scheduled Meds:   feeding supplement (GLUCERNA SHAKE)  237 mL Oral BID BM   insulin aspart  0-9 Units Subcutaneous TID AC & HS   linezolid  600 mg Oral Q12H   losartan  50 mg Oral Daily   metoprolol succinate  50 mg Oral q AM   potassium chloride  40 mEq Oral Once   rivaroxaban  15 mg Oral Q supper   Continuous Infusions:  potassium chloride 10 mEq (04/18/22 0858)   PRN Meds:.acetaminophen **OR** acetaminophen, furosemide, HYDROmorphone (DILAUDID) injection, metoprolol tartrate, ondansetron **OR** ondansetron (ZOFRAN) IV, polyethylene glycol, traMADol  Allergies  Allergen Reactions   Fluzone Quadrivalent [Influenza Vac Split Quad] Other (See Comments)    Patient does NOT want a flu shot- stated it caused A-Fib   Penicillins Anaphylaxis and Other (See Comments)    Tolerates ceftriaxone   Amiodarone Other (See Comments)    "Multiple side effects"   Codeine Other (See Comments)    GI Intolerance   Coumarin Nausea Only   Fosamax [Alendronate] Other (See Comments)    Reaction not noted- listed as allergic on [facility] paperwork   Hydrocodone-Acetaminophen  Other (See Comments)    GI Intolerance   Lotensin [Benazepril] Cough   Metformin And Related Diarrhea and Nausea Only   Norvasc [Amlodipine] Other (See Comments)    Headache    Social History   Socioeconomic History   Marital status: Married    Spouse name: Not on file   Number of children: Not on file   Years of education: Not on file   Highest education level: Not on file  Occupational History   Not on file  Tobacco Use   Smoking status: Former    Types: Cigarettes    Quit date: 08/01/1957    Years since quitting: 64.7   Smokeless tobacco: Never  Vaping Use   Vaping Use: Never used  Substance and Sexual Activity   Alcohol use: No   Drug use: No   Sexual activity: Yes  Other Topics Concern   Not on file  Social History Narrative   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   Married   Social Determinants of Health   Financial Resource Strain: Not on  file  Food Insecurity: No Food Insecurity (04/13/2022)   Hunger Vital Sign    Worried About Running Out of Food in the Last Year: Never true    Ran Out of Food in the Last Year: Never true  Transportation Needs: No Transportation Needs (04/13/2022)   PRAPARE - Administrator, Civil Service (Medical): No    Lack of Transportation (Non-Medical): No  Physical Activity: Not on file  Stress: Not on file  Social Connections: Not on file  Intimate Partner Violence: Not At Risk (04/13/2022)   Humiliation, Afraid, Rape, and Kick questionnaire    Fear of Current or Ex-Partner: No    Emotionally Abused: No    Physically Abused: No    Sexually Abused: No   Family History  Problem Relation Age of Onset   COPD Brother     Vitals BP 123/62 (BP Location: Right Arm)   Pulse 76   Temp 98.2 F (36.8 C) (Oral)   Resp 18   Ht 5\' 3"  (1.6 m)   Wt 53.5 kg   SpO2 98%   BMI 20.90 kg/m    Physical Exam Constitutional:  lying in the bed and appears comfortable     Comments: hard of hearing  Cardiovascular:     Rate and Rhythm: Normal rate and Irregular rhythm.     Heart sounds:  Pulmonary:     Effort: Pulmonary effort is normal on room air     Comments:   Abdominal:     Palpations: Abdomen is soft.     Tenderness: non distended and non tender   Musculoskeletal:        General: rt lower leg swelling and erythema ( redness seems to be improving) with 2 fluid filled bullae in the medial and lateral lower leg. No crepitus or fluctuance noted. No signs of septic joint in the rt ankle and rt knee. Distal NV status intact  Skin:    Comments: as above  Neurological:     General: Grossly non focal, awake, alert and oriented   Psychiatric:        Mood and Affect: Mood normal.    Pertinent Microbiology Results for orders placed or performed during the hospital encounter of 04/13/22  Culture, blood (routine x 2)     Status: None   Collection Time: 04/13/22  6:59 PM   Specimen:  BLOOD  Result Value Ref Range Status   Specimen Description  Final    BLOOD RIGHT ANTECUBITAL Performed at [facility], [address] [place], [place] [address]    Special Requests   Final    BOTTLES DRAWN AEROBIC AND ANAEROBIC Blood Culture results may not be optimal due to an inadequate volume of blood received in culture bottles Performed at [facility], [address] [place], [place] [address]    Culture   Final    NO GROWTH 5 DAYS Performed at [facility], [address] [place], [place] [address]    Report Status 04/18/2022 FINAL  Final  Culture, blood (routine x 2)     Status: None   Collection Time: 04/13/22  6:59 PM   Specimen: BLOOD  Result Value Ref Range Status   Specimen Description   Final    BLOOD LEFT ANTECUBITAL Performed at [facility], [address] [place], [place] [address]    Special Requests   Final    BOTTLES DRAWN AEROBIC ONLY Blood Culture results may not be optimal due to an inadequate volume of blood received in culture bottles Performed at [facility], [address] [place], [place] [address]    Culture   Final    NO GROWTH 5 DAYS Performed at [facility], [address] [place], [place] [address]    Report Status 04/18/2022 FINAL  Final   Pertinent Lab seen by me:    Latest Ref Rng & Units 04/18/2022    4:51 AM 04/17/2022    4:32 AM 04/16/2022    5:20 AM  CBC  WBC 4.0 - 10.5 K/uL 23.0  19.6  16.1   Hemoglobin 12.0 - 15.0 g/dL 57.811.4  46.911.5  62.912.4   Hematocrit 36.0 - 46.0 % 35.7  37.1  38.8   Platelets 150 - 400 K/uL 209  162  186       Latest Ref Rng & Units 04/18/2022    4:51 AM 04/17/2022    4:32 AM 04/16/2022    5:20 AM  CMP  Glucose 70 - 99 mg/dL 98  [id number]    BUN 8 - 23 mg/dL 17  18    Creatinine 4.130.44 - 1.00 mg/dL 2.440.56  0.100.47  2.720.60   Sodium 135 - 145 mmol/L 135  133    Potassium 3.5 - 5.1 mmol/L 2.9  2.9    Chloride 98 - 111 mmol/L 95   96    CO2 22 - 32 mmol/L 32  28    Calcium 8.9 - 10.3 mg/dL 8.8  8.8       Pertinent Imagings/Other Imagings Plain films and CT images have been personally visualized and interpreted; radiology reports have been reviewed. Decision making incorporated into the Impression / Recommendations.  DG CHEST PORT 1 VIEW  Result Date: 04/18/2022 CLINICAL DATA:  New cough EXAM: PORTABLE CHEST 1 VIEW COMPARISON:  Chest radiograph 04/15/2022 FINDINGS: No pleural effusion. No pneumothorax. No new focal airspace opacity. There is slightly improved aeration of the left lung base. Cardiac and mediastinal contours are otherwise unchanged compared to prior exam with persistent rightward deviation of the trachea. Visualized upper abdomen is unremarkable with unchanged slightly prominent loops of bowel in the left upper quadrant. No acute osseous abnormality. IMPRESSION: No new focal airspace opacity. Slightly improved aeration of the left lung base compared to prior exam. Electronically Signed   By: Lorenza CambridgeHemant  Desai M.D.   On: 04/18/2022 09:34   DG CHEST PORT 1 VIEW  Result Date: 04/15/2022 CLINICAL DATA:  N[age] years inpatient with hypoxia. EXAM: PORTABLE CHEST 1 VIEW COMPARISON:  Chest radiograph dated November 10, 2019 FINDINGS: The heart is mildly enlarged. Left basilar opacity suggesting atelectasis or infiltrate. Right lung is clear. Thoracic spondylosis and bilateral glenohumeral and acromioclavicular joint arthritic changes. IMPRESSION: Left basilar opacity suggesting atelectasis or infiltrate. Mild cardiomegaly. No evidence of pulmonary edema or large pleural effusion. Electronically Signed   By: Larose Hires D.O.   On: 04/15/2022 12:51   VAS [place] LOWER EXTREMITY VENOUS (DVT) (7a-7p)  Result Date: 04/13/2022  Lower Venous DVT Study Patient Name:  MARLOW HENDRIE  Date of Exam:   04/13/2022 Medical Rec #: [id number]        Accession #:    [id number] Date of Birth: [birth year]        Patient Gender: F Patient Age:    59 years Exam Location:  [facility] Procedure:      VAS [place] LOWER EXTREMITY VENOUS (DVT) Referring Phys: Riki Sheer --------------------------------------------------------------------------------  Indications: Erythema, Swelling, and Pain. Other Indications: Patient being treated for cellulitis. Limitations: Poor ultrasound/tissue interface and swelling and skin changes. Comparison Study: No prior studies. Performing Technologist: Jean Rosenthal RDMS, RVT  Examination Guidelines: A complete evaluation includes B-mode imaging, spectral Doppler, color Doppler, and power Doppler as needed of all accessible portions of each vessel. Bilateral testing is considered an integral part of a complete examination. Limited examinations for reoccurring indications may be performed as noted. The reflux portion of the exam is performed with the patient in reverse Trendelenburg.  +---------+---------------+---------+-----------+----------+---------------+ RIGHT    CompressibilityPhasicitySpontaneityPropertiesThrombus Aging  +---------+---------------+---------+-----------+----------+---------------+ CFV      Full           Yes      Yes                                  +---------+---------------+---------+-----------+----------+---------------+ SFJ      Full                                                         +---------+---------------+---------+-----------+----------+---------------+ FV Prox  Full                                                         +---------+---------------+---------+-----------+----------+---------------+ FV Mid   Full                                                         +---------+---------------+---------+-----------+----------+---------------+ FV DistalFull                                                         +---------+---------------+---------+-----------+----------+---------------+ PFV      Full                                                          +---------+---------------+---------+-----------+----------+---------------+  POP      Full           Yes      Yes                                  +---------+---------------+---------+-----------+----------+---------------+ PTV                     Yes      Yes                  Patent by color +---------+---------------+---------+-----------+----------+---------------+ PERO                    Yes      Yes                  Patent by color +---------+---------------+---------+-----------+----------+---------------+   +----+---------------+---------+-----------+----------+--------------+ LEFTCompressibilityPhasicitySpontaneityPropertiesThrombus Aging +----+---------------+---------+-----------+----------+--------------+ CFV Full           Yes      Yes                                 +----+---------------+---------+-----------+----------+--------------+     Summary: RIGHT: - There is no evidence of deep vein thrombosis in the lower extremity. However, portions of this examination were limited- see technologist comments above.  - No cystic structure found in the popliteal fossa.  LEFT: - No evidence of common femoral vein obstruction.  *See table(s) above for measurements and observations. Electronically signed by Harold Barban MD on 04/13/2022 at 11:54:55 PM.    Final    CT TIBIA FIBULA RIGHT WO CONTRAST  Result Date: 04/13/2022 CLINICAL DATA:  Soft tissue infection suspected, lower leg, xray done EXAM: CT OF THE LOWER RIGHT EXTREMITY WITHOUT CONTRAST TECHNIQUE: Multidetector CT imaging of the right lower extremity was performed according to the standard protocol. RADIATION DOSE REDUCTION: This exam was performed according to the departmental dose-optimization program which includes automated exposure control, adjustment of the mA and/or kV according to patient size and/or use of iterative reconstruction technique. COMPARISON:  None Available. FINDINGS:  Bones/Joint/Cartilage There is no evidence of acute fracture. There is no bony erosion or frank bony destruction. Diffuse osteopenia. Mild-to-moderate osteoarthritis in the midfoot and forefoot. Moderate tibiotalar osteoarthritis. There is severe tricompartment osteoarthritis of the right knee with chondrocalcinosis. Ligaments Suboptimally assessed by CT. Muscles and Tendons There is no intramuscular collection. There is atrophy in the anterior and posterior compartments of the lower leg. Soft tissues There is diffuse skin thickening and subcutaneous soft tissue swelling of the right lower leg and foot with blister like skin lesions along the lower leg. This is asymmetric in comparison to the left lower extremity, which is partially visualized. There is no well-defined/drainable collection (other than the blister-like skin lesions), and noncontrast CT. There is no evidence of soft tissue gas. IMPRESSION: Diffuse skin thickening and subcutaneous soft tissue swelling of the right lower leg and foot, as can be seen in cellulitis or lymphedema. Blister-like skin lesions of the distal lower leg. This is asymmetric in comparison to the partially visualized left lower leg. There is no well-defined/drainable fluid collection on noncontrast CT. No acute osseous abnormality. No soft tissue gas. Electronically Signed   By: Maurine Simmering M.D.   On: 04/13/2022 20:25   CT FOOT RIGHT WO CONTRAST  Result Date: 04/13/2022 CLINICAL DATA:  Soft tissue infection suspected, lower leg,  xray done EXAM: CT OF THE LOWER RIGHT EXTREMITY WITHOUT CONTRAST TECHNIQUE: Multidetector CT imaging of the right lower extremity was performed according to the standard protocol. RADIATION DOSE REDUCTION: This exam was performed according to the departmental dose-optimization program which includes automated exposure control, adjustment of the mA and/or kV according to patient size and/or use of iterative reconstruction technique. COMPARISON:  None  Available. FINDINGS: Bones/Joint/Cartilage There is no evidence of acute fracture. There is no bony erosion or frank bony destruction. Diffuse osteopenia. Mild-to-moderate osteoarthritis in the midfoot and forefoot. Moderate tibiotalar osteoarthritis. There is severe tricompartment osteoarthritis of the right knee with chondrocalcinosis. Ligaments Suboptimally assessed by CT. Muscles and Tendons There is no intramuscular collection. There is atrophy in the anterior and posterior compartments of the lower leg. Soft tissues There is diffuse skin thickening and subcutaneous soft tissue swelling of the right lower leg and foot with blister like skin lesions along the lower leg. This is asymmetric in comparison to the left lower extremity, which is partially visualized. There is no well-defined/drainable collection (other than the blister-like skin lesions), and noncontrast CT. There is no evidence of soft tissue gas. IMPRESSION: Diffuse skin thickening and subcutaneous soft tissue swelling of the right lower leg and foot, as can be seen in cellulitis or lymphedema. Blister-like skin lesions of the distal lower leg. This is asymmetric in comparison to the partially visualized left lower leg. There is no well-defined/drainable fluid collection on noncontrast CT. No acute osseous abnormality. No soft tissue gas. Electronically Signed   By: Caprice Renshaw M.D.   On: 04/13/2022 20:25    I spent 85 minutes for this patient encounter including review of prior medical records/discussing diagnostics and treatment plan with the patient/family/coordinate care with primary/other specialits with greater than 50% of time in face to face encounter.   Electronically signed by:   Odette Fraction, MD Infectious Disease Physician [facility] for Infectious Disease Pager: [phone]

## 2022-04-18 NOTE — Progress Notes (Signed)
PROGRESS NOTE    Jasmine Jenkins  QMV:[id number] DOB: 1926/06/27 DOA: 04/13/2022 PCP: Kirby Funk, MD   Brief Narrative:  This 88 years old female with PMH significant for type 2 diabetes, hypertension, RAS, history of strep sepsis presented in the ED with complaints of right leg pain and swelling for last 1 week.  Patient has gone to her primary care physician and has been receiving IM ceftriaxone daily,  Patient reports persistent pain and swelling has worsened, PCP has been checking her CBC daily which shows increasing white cell count.  Patient was sent from PCP for admission. Patient is admitted for right leg cellulitis and started on IV antibiotics.  Assessment & Plan:   Principal Problem:   Cellulitis of right leg Active Problems:   AKI (acute kidney injury) (HCC)   Sepsis due to cellulitis (HCC)   Atrial fibrillation (HCC)   HYPERTENSION, BENIGN   DM2 (diabetes mellitus, type 2) (HCC)  Right lower extremity cellulitis: > Improving Patient presented with worsening pain, swelling and redness in the right lower extremity.   Patient failed outpatient antibiotics treatment. CT shows no subcutaneous air, fluid-filled bulla is noted. Initiated IV antibiotics (vancomycin and cefepime). Orthopedics consulted for possible necrotizing fasciitis, It was ruled out. Ortho recommended cellulitis is improving.  Recommended change to oral antibiotics next day   Redness has significantly improved. Antibiotics changed to linezolid. She started draining from blisters, happened as a result of shear force while moving on the bed. Cellulitis is improving but white cell count is going up.  Infectious disease consulted.  Sepsis due to cellulitis: Patient meets sepsis criteria at the time of admission (tachycardia, leukocytosis, lactic acid 1.7.  RLE cellulitis) Continue IV hydration Continue antibiotics, blood cultures no growth so far. Sepsis physiology improving  Acute kidney injury: >  Resolved. Likely prerenal ATN in the setting of cellulitis. Held losartan, continue IV hydration.  Monitor renal functions. Renal functions improved and back to normal. 1.81> 1.32> 0.93>0.66  Atrial fibrillation: Xarelto was on hold in the setting of AKI and started on heparin gtt.  Continue metoprolol for heart rate control. Heparin discontinued,  resumed on Xarelto. HR controlled.  Diabetes mellitus type 2 Hold p.o. diabetic medications Continue regular insulin sliding scale  Essential hypertension: Losartan resumed. Continue metoprolol for hypertension. BP controlled.  Hypokalemia: Replaced.  Continue to monitor.  Acute hypoxic respiratory failure: > Resolved. Patient became short of breath, was placed on 2 L of supplemental oxygen. Chest x-ray shows infiltrate versus atelectasis.  Antibiotics changed to linezolid. Weaned down to room air.  Peripheral vascular disease: Patient found to have decreased circulation, feeble pulses distally. Vascular surgery consulted.  Recommended continue antibiotics and elevation of her legs and outpatient follow-up with right lower extremity arterial duplex and ABI when cellulitis resolves.  Goals of care discussion: Palliative care consulted.  Patient is open to SNF on discharge.   Continue to treat the treatable.  DVT prophylaxis: Xarelto Code Status: DNR Family Communication: Son at bed side Disposition Plan:   Status is: Inpatient Remains inpatient appropriate because: Admitted for RLE cellulitis,  failed outpatient treatment requiring IV antibiotics.. PT and OT recommended SNF.  Anticipated discharge in 1-2 days.    Consultants:  Orthopeadics. Infectious disease  Procedures: CT   Antimicrobials:  Anti-infectives (From admission, onward)    Start     Dose/Rate Route Frequency Ordered Stop   04/16/22 2200  linezolid (ZYVOX) tablet 600 mg        600 mg Oral Every 12  hours 04/16/22 1435     04/16/22 1800  ceFEPIme  (MAXIPIME) 2 g in sodium chloride 0.9 % 100 mL IVPB  Status:  Discontinued        2 g 200 mL/hr over 30 Minutes Intravenous Every 12 hours 04/16/22 1437 04/17/22 1304   04/15/22 1200  linezolid (ZYVOX) IVPB 600 mg  Status:  Discontinued        600 mg 300 mL/hr over 60 Minutes Intravenous Every 12 hours 04/15/22 1100 04/16/22 1435   04/13/22 2200  ceFEPIme (MAXIPIME) 2 g in sodium chloride 0.9 % 100 mL IVPB  Status:  Discontinued        2 g 200 mL/hr over 30 Minutes Intravenous Every 24 hours 04/13/22 2120 04/16/22 1437   04/13/22 2118  vancomycin variable dose per unstable renal function (pharmacist dosing)  Status:  Discontinued         Does not apply See admin instructions 04/13/22 2118 04/15/22 1059   04/13/22 1900  vancomycin (VANCOCIN) IVPB 1000 mg/200 mL premix        1,000 mg 200 mL/hr over 60 Minutes Intravenous  Once 04/13/22 1859 04/13/22 2051   04/13/22 1845  cefTRIAXone (ROCEPHIN) 1 g in sodium chloride 0.9 % 100 mL IVPB        1 g 200 mL/hr over 30 Minutes Intravenous  Once 04/13/22 1841 04/13/22 1939      Subjective: Patient was seen and examined at bedside.  Overnight events noted. Patient reports having worsening pain, continued on pain medications. Redness in the legs has improved with antibiotics. Bluish discoloration in the fingers has resolved.  She is weaned down to room air.  Objective: Vitals:   04/17/22 1320 04/17/22 2054 04/18/22 0522 04/18/22 1133  BP: (!) 101/46 (!) 147/79 123/62 (!) 107/53  Pulse: 64 77 76 80  Resp: 16 18 18 15   Temp: (!) 97.3 F (36.3 C) 97.8 F (36.6 C) 98.2 F (36.8 C) 98 F (36.7 C)  TempSrc: Oral Oral Oral Axillary  SpO2: 99% 100% 98% 100%  Weight:      Height:        Intake/Output Summary (Last 24 hours) at 04/18/2022 1322 Last data filed at 04/18/2022 0500 Gross per 24 hour  Intake 270 ml  Output 225 ml  Net 45 ml   Filed Weights   04/13/22 1847  Weight: 53.5 kg    Examination:  General exam: Appears  comfortable, not in any acute distress, deconditioned. Respiratory system: CTA bilaterally, respiratory effort normal, RR 15. Cardiovascular system: S1-S2 heard, irregular rhythm, no murmur. Gastrointestinal system: Abdomen is soft, non tender, non distended, BS+ Central nervous system: Alert and oriented x 2, no focal neurological deficits. Extremities: RLE erythema is improving, fluid-filled bullae started draining. Skin: No rashes, lesions or ulcers Psychiatry: Judgement and insight appear normal. Mood & affect appropriate.     Data Reviewed: I have personally reviewed following labs and imaging studies  CBC: Recent Labs  Lab 04/13/22 1728 04/14/22 0751 04/15/22 0546 04/16/22 0520 04/17/22 0432 04/18/22 0451  WBC 20.8* 18.7* 16.9* 16.1* 19.6* 23.0*  NEUTROABS 19.1*  --   --   --   --   --   HGB 13.5 11.2* 12.0 12.4 11.5* 11.4*  HCT 42.5 35.3* 36.1 38.8 37.1 35.7*  MCV 92.8 92.4 90.0 91.9 94.4 90.4  PLT 186 171 166 186 162 209   Basic Metabolic Panel: Recent Labs  Lab 04/13/22 1728 04/14/22 0751 04/15/22 0546 04/16/22 0520 04/17/22 0432 04/18/22 0451  NA  130* 133* 135  --  133* 135  K 3.7 3.1* 3.7  --  2.9* 2.9*  CL 92* 99 101  --  96* 95*  CO2 21* 25 27  --  28 32  GLUCOSE 254* 89 189*  --  106* 98  BUN 81* 78* 51*  --  18 17  CREATININE 1.81* 1.38* 0.93 0.60 0.47 0.56  CALCIUM 9.9 9.0 9.3  --  8.8* 8.8*  MG  --   --  2.1  --  1.9 1.6*  PHOS  --   --  2.3*  --  1.5* 2.2*   GFR: Estimated Creatinine Clearance: 35.6 mL/min (by C-G formula based on SCr of 0.56 mg/dL). Liver Function Tests: Recent Labs  Lab 04/14/22 0751  AST 15  ALT 15  ALKPHOS 45  BILITOT 1.1  PROT 5.5*  ALBUMIN 2.4*   No results for input(s): "LIPASE", "AMYLASE" in the last 168 hours. No results for input(s): "AMMONIA" in the last 168 hours. Coagulation Profile: No results for input(s): "INR", "PROTIME" in the last 168 hours. Cardiac Enzymes: No results for input(s): "CKTOTAL",  "CKMB", "CKMBINDEX", "TROPONINI" in the last 168 hours. BNP (last 3 results) No results for input(s): "PROBNP" in the last 8760 hours. HbA1C: No results for input(s): "HGBA1C" in the last 72 hours.  CBG: Recent Labs  Lab 04/17/22 1152 04/17/22 1700 04/17/22 2119 04/18/22 0731 04/18/22 1129  GLUCAP 261* 313* 240* 76 158*   Lipid Profile: No results for input(s): "CHOL", "HDL", "LDLCALC", "TRIG", "CHOLHDL", "LDLDIRECT" in the last 72 hours. Thyroid Function Tests: No results for input(s): "TSH", "T4TOTAL", "FREET4", "T3FREE", "THYROIDAB" in the last 72 hours. Anemia Panel: No results for input(s): "VITAMINB12", "FOLATE", "FERRITIN", "TIBC", "IRON", "RETICCTPCT" in the last 72 hours. Sepsis Labs: Recent Labs  Lab 04/13/22 1840 04/13/22 2040 04/17/22 0432  PROCALCITON  --   --  0.62  LATICACIDVEN 1.7 1.4  --     Recent Results (from the past 240 hour(s))  Culture, blood (routine x 2)     Status: None   Collection Time: 04/13/22  6:59 PM   Specimen: BLOOD  Result Value Ref Range Status   Specimen Description   Final    BLOOD RIGHT ANTECUBITAL Performed at [facility], [address], [place], [place] [address]    Special Requests   Final    BOTTLES DRAWN AEROBIC AND ANAEROBIC Blood Culture results may not be optimal due to an inadequate volume of blood received in culture bottles Performed at [facility], [address], [place], [place] [address]    Culture   Final    NO GROWTH 5 DAYS Performed at [facility], [address], [place], [place] [address]    Report Status 04/18/2022 FINAL  Final  Culture, blood (routine x 2)     Status: None   Collection Time: 04/13/22  6:59 PM   Specimen: BLOOD  Result Value Ref Range Status   Specimen Description   Final    BLOOD LEFT ANTECUBITAL Performed at [facility], [address] [place], [place] [address]    Special Requests   Final    BOTTLES DRAWN  AEROBIC ONLY Blood Culture results may not be optimal due to an inadequate volume of blood received in culture bottles Performed at [facility], [address] [place], [place] [address]    Culture   Final    NO GROWTH 5 DAYS Performed at [facility], [address] [place], [place] [address]    Report Status 04/18/2022 FINAL  Final    Radiology Studies: DG CHEST PORT 1 VIEW  Result Date: 04/18/2022 CLINICAL DATA:  New cough EXAM: PORTABLE CHEST 1 VIEW COMPARISON:  Chest radiograph 04/15/2022 FINDINGS: No pleural effusion. No pneumothorax. No new focal airspace opacity. There is slightly improved aeration of the left lung base. Cardiac and mediastinal contours are otherwise unchanged compared to prior exam with persistent rightward deviation of the trachea. Visualized upper abdomen is unremarkable with unchanged slightly prominent loops of bowel in the left upper quadrant. No acute osseous abnormality. IMPRESSION: No new focal airspace opacity. Slightly improved aeration of the left lung base compared to prior exam. Electronically Signed   By: Lorenza Cambridge M.D.   On: 04/18/2022 09:34    Scheduled Meds:  feeding supplement (GLUCERNA SHAKE)  237 mL Oral BID BM   insulin aspart  0-9 Units Subcutaneous TID AC & HS   linezolid  600 mg Oral Q12H   losartan  50 mg Oral Daily   metoprolol succinate  50 mg Oral q AM   rivaroxaban  15 mg Oral Q supper   Continuous Infusions:     LOS: 5 days    Time spent: 35 mins    Treyvone Chelf, MD [facility]   If 7PM-7AM, please contact night-coverage

## 2022-04-19 DIAGNOSIS — L0390 Cellulitis, unspecified: Secondary | ICD-10-CM

## 2022-04-19 DIAGNOSIS — R627 Adult failure to thrive: Secondary | ICD-10-CM | POA: Diagnosis not present

## 2022-04-19 DIAGNOSIS — N179 Acute kidney failure, unspecified: Secondary | ICD-10-CM | POA: Diagnosis not present

## 2022-04-19 DIAGNOSIS — L03115 Cellulitis of right lower limb: Secondary | ICD-10-CM | POA: Diagnosis not present

## 2022-04-19 DIAGNOSIS — A419 Sepsis, unspecified organism: Principal | ICD-10-CM

## 2022-04-19 MED ORDER — HYDROMORPHONE HCL 1 MG/ML IJ SOLN
1.0000 mg | INTRAMUSCULAR | Status: DC | PRN
  Administered 2022-04-19 – 2022-04-20 (×5): 1 mg via INTRAVENOUS
  Filled 2022-04-19 (×5): qty 1

## 2022-04-19 MED ORDER — HYDROMORPHONE HCL 1 MG/ML IJ SOLN
0.5000 mg | INTRAMUSCULAR | Status: DC
  Administered 2022-04-19 – 2022-04-20 (×8): 0.5 mg via INTRAVENOUS
  Filled 2022-04-19 (×8): qty 0.5

## 2022-04-19 NOTE — Progress Notes (Signed)
Chaplain had a follow up conversation with Jasmine Jenkins daughter, who updated chaplain on Jasmine Jenkins's decision to be Comfort Care only.  They have not been able to awaken Jasmine Jenkins for several hours.  Chaplain provided grief support around the possible loss of both parents in such a short time. Jasmine Jenkins and her brother both have good supports in place and she feels that they will be okay in time. They are honoring their mother's wishes even though it is not what they want for themselves.  [address] [place] Pager, [phone]

## 2022-04-19 NOTE — TOC Progression Note (Signed)
Transition of Care [facility]) - Progression Note    Patient Details  Name: Jasmine Jenkins MRN: [id number] Date of Birth: 20-Nov-1925  Transition of Care [facility]) CM/SW Contact  Albertia Carvin, Juliann Pulse, RN Phone Number: 04/19/2022, 10:36 AM  Clinical Narrative:Awaiting family choice of residential hospice facility.       Expected Discharge Plan: [place] Barriers to Discharge: Continued Medical Work up  Expected Discharge Plan and Services Expected Discharge Plan: [place] Choice: [place] arrangements for the past 2 months: Single Family Home                                       Social Determinants of Health (SDOH) Interventions    Readmission Risk Interventions     No data to display

## 2022-04-19 NOTE — Plan of Care (Signed)
  Problem: Pain Managment: Goal: General experience of comfort will improve Outcome: Progressing   Problem: Safety: Goal: Ability to remain free from injury will improve Outcome: Progressing   

## 2022-04-19 NOTE — Progress Notes (Signed)
Chaplain engaged in an initial visit with Jasmine Jenkins and her son.  Chaplain provided space for son to share about his parent's journey and healthcare history.  While he never imagined having to go through the deaths of two parents at one time, his voiced feeling a sense of preparation and understanding about this time.  Son had been able to grieve his father's death along the way as he suffered memory loss and significant changes in his capacities.  While Jasmine Jenkins being comfort care is sudden, he recognizes that she was coming to this point of really being tired. He voiced that Jasmine Jenkins had a sense of purpose when his father was living because she still was able to take care of him in a way but now that, that has been removed Jasmine Jenkins knows her children are in a good place and that she desires to stop suffering.   Son shared that [name] has always been an introverted internal processor due to the many things she went through as a child.  He was grateful to hear Jasmine Jenkins vocalize her desire for comfort care.  Chaplain was able to help Jasmine Jenkins drink water at the bedside and ensure she had what she needed.  Chaplain provided reflective listening, and engaged in [place].  Chaplain provided support and presence.      04/19/22 1000  Clinical Encounter Type  Visited With Patient and family together  Visit Type Initial

## 2022-04-19 NOTE — Progress Notes (Signed)
[facility]) Hospital Liaison Note  Received request from Dessa Phi, [facility] for family interest in [facility]. Spoke with patient's son, Herbie [place], to confirm interest and explain services. Eligibility confirmed per [facility] MD. Unfortunately [facility] is not able to offer a room today. Family and TOC aware hospital liaison will follow up tomorrow or sooner if room becomes available.   Please do not hesitate to call with any questions.    Thank you, Zigmund Gottron RN  [facility] Liaison [phone]

## 2022-04-19 NOTE — Progress Notes (Addendum)
Daily Progress Note   Patient Name: Jasmine Jenkins       Date: 04/19/2022 DOB: Apr 09, 1926  Age: 88 y.o. MRN#: [id number] Attending Physician: Burnadette Pop, MD Primary Care Physician: Kirby Funk, MD Admit Date: 04/13/2022  Reason for Consultation/Follow-up: Establishing goals of care  Patient Profile/HPI: This 88 years old female with PMH significant for type 2 diabetes, hypertension, RAS, history of strep sepsis presented in the ED with complaints of right leg pain and swelling for last 1 week.  Patient has gone to her primary care physician and has been receiving IM ceftriaxone daily,  Patient reports persistent pain and swelling has worsened, PCP has been checking her CBC daily which shows increasing white cell count. Patient was sent from PCP for admission. Patient is admitted for right leg cellulitis and started on IV antibiotics.   Palliative care has been asked to get involved to further address goals of care.   Subjective: Chart reviewed including labs, progress notes, medications.  Jeslyn is resting well. Her son is at bedside and report is provided by him.   She has had intermittent worsening pain before her next prn medications were due. Has utilized 4mg  IV hydromorphone and 100mg  tramadol in the last 24 hours.  She has pain in her RLE and a general feeling of feeling bad all over. She is not eating, she is taking minimal sips of water for dry mouth. She is sleeping more than she is awake.    Physical Exam Vitals and nursing note reviewed.  Constitutional:      Appearance: She is ill-appearing.     Comments: frail  Skin:    Comments: Flushed, RLE with wound and redness, weeping  Neurological:     Comments: sleeping             Vital Signs: BP 114/64 (BP Location: Right  Arm)   Pulse 92   Temp 97.8 F (36.6 C) (Oral)   Resp 20   Ht 5\' 3"  (1.6 m)   Wt 53.5 kg   SpO2 96%   BMI 20.90 kg/m  SpO2: SpO2: 96 % O2 Device: O2 Device: Room Air O2 Flow Rate: O2 Flow Rate (L/min): 2 L/min  Intake/output summary:  Intake/Output Summary (Last 24 hours) at 04/19/2022 1123 Last data filed at 04/19/2022 0323 Gross per 24 hour  Intake 440 ml  Output 650 ml  Net -210 ml   LBM: Last BM Date : 04/12/22 Baseline Weight: Weight: 53.5 kg Most recent weight: Weight: 53.5 kg       Palliative Assessment/Data: PPS: 20%      Patient Active Problem List   Diagnosis Date Noted   Cellulitis of right leg 04/13/2022   AKI (acute kidney injury) ([place]) 04/13/2022   Sepsis due to cellulitis ([place]) 04/13/2022   DM2 (diabetes mellitus, type 2) ([place]) 04/13/2022   Dysphagia 08/30/2017   Esophageal stricture 08/30/2017   CAD, NATIVE VESSEL 01/04/2010   CHEST PAIN-PRECORDIAL 05/27/2009   HYPERTENSION, BENIGN 05/26/2009   Atrial fibrillation ([place]) 05/26/2009   DYSPNEA 05/26/2009   Atherosclerosis of renal artery ([place]) 05/21/2009    Palliative Care Assessment & Plan    Assessment/Recommendations/Plan  Continue current comfort care plan Scheduled IV morphine 1mg  q4hrs with 1mg  q1hr prn for comfort Referral for inpatient hospice has been made   Code Status: DNR  Prognosis:  < 2 weeks due to sepsis related to progressing cellulitis, overall failure to thrive, not eating, minimal sips, plan for comfort and allow for natural dying process  Discharge Planning: Hospice facility pending approval and bed availability  Care plan was discussed with patient's son and care team  Thank you for allowing the Palliative Medicine Team to assist in the care of this patient.  Greater than 50%  of this time was spent counseling and coordinating care related to the above assessment and plan.  Mariana Kaufman, AGNP-C Palliative Medicine   Please contact Palliative Medicine Team  phone at [phone] for questions and concerns.

## 2022-04-19 NOTE — Progress Notes (Signed)
PROGRESS NOTE  Jasmine Jenkins  YBO:[id number] DOB: 1925/11/26 DOA: 04/13/2022 PCP: Lavone Orn, MD   Brief Narrative: Patient is a 88 year old female with history of diabetes type 2, hypertension who presented here with complaint of right leg pain, swelling, elevated white cell count after she was referred by her PCP.  She was started on broad-spectrum antibiotics for right leg cellulitis. Palliative care consulted for goals of care and she has been converted to full comfort care.  Plan for pursue residential hospice, continuing  comfort care  Assessment & Plan:  Principal Problem:   Cellulitis of right leg Active Problems:   AKI (acute kidney injury) ([place])   Sepsis due to cellulitis ([place])   Atrial fibrillation ([place])   HYPERTENSION, BENIGN   DM2 (diabetes mellitus, type 2) ([place])  Sepsis secondary to right lower extremity cellulitis: Presented with worsening pain, swelling, redness on the right lower extremity.  Failed outpatient antibiotic therapy.  Currently on full comfort care.Antibiotics discontinued  AKI: Resolved  Other past medical problems: A-fib, diabetes type 2, hypertension, peripheral vascular disease         DVT prophylaxis:None     Code Status: DNR  Family Communication: Son at bedside  Patient status:Inpatient  Patient is from :Home  Anticipated discharge ID:[id number] hospice  Estimated DC date:1-2 days   Consultants: palliative care  Procedures:None  Antimicrobials:  Anti-infectives (From admission, onward)    Start     Dose/Rate Route Frequency Ordered Stop   04/16/22 2200  linezolid (ZYVOX) tablet 600 mg  Status:  Discontinued        600 mg Oral Every 12 hours 04/16/22 1435 04/18/22 1513   04/16/22 1800  ceFEPIme (MAXIPIME) 2 g in sodium chloride 0.9 % 100 mL IVPB  Status:  Discontinued        2 g 200 mL/hr over 30 Minutes Intravenous Every 12 hours 04/16/22 1437 04/17/22 1304   04/15/22 1200  linezolid (ZYVOX) IVPB 600 mg  Status:   Discontinued        600 mg 300 mL/hr over 60 Minutes Intravenous Every 12 hours 04/15/22 1100 04/16/22 1435   04/13/22 2200  ceFEPIme (MAXIPIME) 2 g in sodium chloride 0.9 % 100 mL IVPB  Status:  Discontinued        2 g 200 mL/hr over 30 Minutes Intravenous Every 24 hours 04/13/22 2120 04/16/22 1437   04/13/22 2118  vancomycin variable dose per unstable renal function (pharmacist dosing)  Status:  Discontinued         Does not apply See admin instructions 04/13/22 2118 04/15/22 1059   04/13/22 1900  vancomycin (VANCOCIN) IVPB 1000 mg/200 mL premix        1,000 mg 200 mL/hr over 60 Minutes Intravenous  Once 04/13/22 1859 04/13/22 2051   04/13/22 1845  cefTRIAXone (ROCEPHIN) 1 g in sodium chloride 0.9 % 100 mL IVPB        1 g 200 mL/hr over 30 Minutes Intravenous  Once 04/13/22 1841 04/13/22 1939       Subjective: Patient seen and examined at the bedside today.  Son at bedside.  Chaplain at bedside.  She looks overall comfortable, not in any kind of distress.  Lying in bed.  Objective: Vitals:   04/17/22 2054 04/18/22 0522 04/18/22 1133 04/19/22 0358  BP: (!) 147/79 123/62 (!) 107/53 114/64  Pulse: 77 76 80 92  Resp: 18 18 15 20   Temp: 97.8 F (36.6 C) 98.2 F (36.8 C) 98 F (36.7 C) 97.8 F (36.6  C)  TempSrc: Oral Oral Axillary Oral  SpO2: 100% 98% 100% 96%  Weight:      Height:        Intake/Output Summary (Last 24 hours) at 04/19/2022 0955 Last data filed at 04/19/2022 0323 Gross per 24 hour  Intake 440 ml  Output 650 ml  Net -210 ml   Filed Weights   04/13/22 1847  Weight: 53.5 kg    Examination:  General exam: Overall comfortable, in full comfort care HEENT: eyes closed Respiratory system:  no wheezes or crackles  Cardiovascular system: S1 & S2 heard, RRR.  Gastrointestinal system: Abdomen is nondistended, soft and nontender. Central nervous system: Not Alert or oriented Extremities: No edema, no clubbing ,no cyanosis Skin: Cellulitis/erythema on the right  lower extremity   Data Reviewed: I have personally reviewed following labs and imaging studies  CBC: Recent Labs  Lab 04/13/22 1728 04/14/22 0751 04/15/22 0546 04/16/22 0520 04/17/22 0432 04/18/22 0451  WBC 20.8* 18.7* 16.9* 16.1* 19.6* 23.0*  NEUTROABS 19.1*  --   --   --   --   --   HGB 13.5 11.2* 12.0 12.4 11.5* 11.4*  HCT 42.5 35.3* 36.1 38.8 37.1 35.7*  MCV 92.8 92.4 90.0 91.9 94.4 90.4  PLT 186 171 166 186 162 209   Basic Metabolic Panel: Recent Labs  Lab 04/13/22 1728 04/14/22 0751 04/15/22 0546 04/16/22 0520 04/17/22 0432 04/18/22 0451  NA 130* 133* 135  --  133* 135  K 3.7 3.1* 3.7  --  2.9* 2.9*  CL 92* 99 101  --  96* 95*  CO2 21* 25 27  --  28 32  GLUCOSE 254* 89 189*  --  106* 98  BUN 81* 78* 51*  --  18 17  CREATININE 1.81* 1.38* 0.93 0.60 0.47 0.56  CALCIUM 9.9 9.0 9.3  --  8.8* 8.8*  MG  --   --  2.1  --  1.9 1.6*  PHOS  --   --  2.3*  --  1.5* 2.2*     Recent Results (from the past 240 hour(s))  Culture, blood (routine x 2)     Status: None   Collection Time: 04/13/22  6:59 PM   Specimen: BLOOD  Result Value Ref Range Status   Specimen Description   Final    BLOOD RIGHT ANTECUBITAL Performed at [facility], [address] [place], [place] [address]    Special Requests   Final    BOTTLES DRAWN AEROBIC AND ANAEROBIC Blood Culture results may not be optimal due to an inadequate volume of blood received in culture bottles Performed at [facility], [address], [place], [place] [address]    Culture   Final    NO GROWTH 5 DAYS Performed at [facility], [address], [place], [place] [address]    Report Status 04/18/2022 FINAL  Final  Culture, blood (routine x 2)     Status: None   Collection Time: 04/13/22  6:59 PM   Specimen: BLOOD  Result Value Ref Range Status   Specimen Description   Final    BLOOD LEFT ANTECUBITAL Performed at [facility], [address],  [place], [place] [address]    Special Requests   Final    BOTTLES DRAWN AEROBIC ONLY Blood Culture results may not be optimal due to an inadequate volume of blood received in culture bottles Performed at [facility], [address] [place], [place]  [address]    Culture   Final    NO GROWTH 5 DAYS Performed at [facility], [address], [place], [place] [address]    Report Status 04/18/2022 FINAL  Final     Radiology Studies: DG CHEST PORT 1 VIEW  Result Date: 04/18/2022 CLINICAL DATA:  New cough EXAM: PORTABLE CHEST 1 VIEW COMPARISON:  Chest radiograph 04/15/2022 FINDINGS: No pleural effusion. No pneumothorax. No new focal airspace opacity. There is slightly improved aeration of the left lung base. Cardiac and mediastinal contours are otherwise unchanged compared to prior exam with persistent rightward deviation of the trachea. Visualized upper abdomen is unremarkable with unchanged slightly prominent loops of bowel in the left upper quadrant. No acute osseous abnormality. IMPRESSION: No new focal airspace opacity. Slightly improved aeration of the left lung base compared to prior exam. Electronically Signed   By: Lorenza Cambridge M.D.   On: 04/18/2022 09:34    Scheduled Meds: Continuous Infusions:   LOS: 6 days   Burnadette Pop, MD [facility] P9/20/2023, 9:55 AM

## 2022-04-19 NOTE — TOC Progression Note (Signed)
Transition of Care [facility]) - Progression Note    Patient Details  Name: Jasmine Jenkins MRN: [id number] Date of Birth: [birth year]  Transition of Care [facility]) CM/SW Contact  Rae Plotner, Juliann Pulse, RN Phone Number: 04/19/2022, 2:54 PM  Clinical Narrative:  Spoke to patient's son Herbie Baltimore about Residential Hospice choice-choose [facility]-either [facility] or [facility]. Rep Judson Roch aware-no beds available today-she will f/u for MD to eval.Await outcome of acceptance.    Expected Discharge Plan: [place] Barriers to Discharge: Continued Medical Work up  Expected Discharge Plan and Services Expected Discharge Plan: [place]: [place] arrangements for the past 2 months: Single Family Home                                       Social Determinants of Health (SDOH) Interventions    Readmission Risk Interventions     No data to display

## 2022-04-20 DIAGNOSIS — L03115 Cellulitis of right lower limb: Secondary | ICD-10-CM | POA: Diagnosis not present

## 2022-04-20 NOTE — Progress Notes (Signed)
[id number] [facility]) Hospital Liaison Note  Bed is available today for patient at [facility]. Spoke with patient's son, Herbie [place] who is agreeable to transfer today.   Dessa Phi, TOC aware.  RN, please call report to [phone] prior to patient leaving the unit. Please send signed and completed DNR with patient at discharge.   Thank you,  Zigmund Gottron RN  [facility] Liaison [phone]

## 2022-04-20 NOTE — TOC Transition Note (Signed)
Transition of Care [facility]) - CM/SW Discharge Note   Patient Details  Name: Jasmine Jenkins MRN: [id number] Date of Birth: September 02, 1925  Transition of Care [facility]) CM/SW Contact:  Dessa Phi, RN Phone Number: 04/20/2022, 1:17 PM   Clinical Narrative:  d/c [facility] accepted,bed available-tel# report [phone]. PTAR called. No further CM needs.     Final next level of care: [place] Barriers to Discharge: No Barriers Identified   Patient Goals and CMS Choice Patient states their goals for this hospitalization and ongoing recovery are:: To return back home with home health after rehab. CMS Medicare.gov Compare Post Acute Care list provided to:: Patient Represenative (must comment) Choice offered to / list presented to : Adult Children  Discharge Placement              Patient chooses bed at: Other - please specify in the comment section below: ([facility]) Patient to be transferred to facility by:  Corey Harold) Name of family member notified:  (Robert(son)) Patient and family notified of of transfer: 04/20/22  Discharge Plan and Services     Post Acute Care Choice: [place]                               Social Determinants of Health (SDOH) Interventions     Readmission Risk Interventions     No data to display

## 2022-04-20 NOTE — Discharge Summary (Signed)
Physician Discharge Summary  Jasmine Jenkins ZOX:[id number] DOB: [birth year] DOA: 04/13/2022  PCP: Kirby Funk, MD  Admit date: 04/13/2022 Discharge date: 04/20/2022  Admitted From: Home Disposition:  Home  Discharge Condition:Stable CODE STATUS:DNR  Brief/Interim Summary:  Patient is a 88 year old female with history of diabetes type 2, hypertension who presented here with complaint of right leg pain, swelling, elevated white cell count after she was referred by her PCP. She was started on broad-spectrum antibiotics for right leg cellulitis. Palliative care consulted for goals of care and she has been converted to full comfort care.  Plan is to discharge to  residential hospice.  Following problems were addressed:  Sepsis secondary to right lower extremity cellulitis: Presented with worsening pain, swelling, redness on the right lower extremity.  Failed outpatient antibiotic therapy.  Currently on full comfort care.Antibiotics discontinued   AKI: Resolved   Other past medical problems: A-fib, diabetes type 2, hypertension, peripheral vascular disease    Discharge Diagnoses:  Principal Problem:   Cellulitis of right leg Active Problems:   AKI (acute kidney injury) (HCC)   Sepsis due to cellulitis [facility])   Atrial fibrillation (HCC)   HYPERTENSION, BENIGN   DM2 (diabetes mellitus, type 2) (HCC)    Discharge Instructions  Discharge Instructions     No wound care   Complete by: As directed       Allergies as of 04/20/2022       Reactions   Fluzone Quadrivalent [influenza Vac Split Quad] Other (See Comments)   Patient does NOT want a flu shot- stated it caused A-Fib   Penicillins Anaphylaxis, Other (See Comments)   Tolerates ceftriaxone   Amiodarone Other (See Comments)   "Multiple side effects"   Codeine Other (See Comments)   GI Intolerance   Coumarin Nausea Only   Fosamax [alendronate] Other (See Comments)   Reaction not noted- listed as allergic on [facility]  paperwork   Hydrocodone-acetaminophen Other (See Comments)   GI Intolerance   Lotensin [benazepril] Cough   Metformin And Related Diarrhea, Nausea Only   Norvasc [amlodipine] Other (See Comments)   Headache        Medication List     STOP taking these medications    cyanocobalamin 1000 MCG tablet Commonly known as: VITAMIN B12   furosemide 40 MG tablet Commonly known as: LASIX   glimepiride 4 MG tablet Commonly known as: AMARYL   ICAPS AREDS FORMULA PO   losartan 50 MG tablet Commonly known as: COZAAR   metoprolol succinate 50 MG 24 hr tablet Commonly known as: TOPROL-XL   polyethylene glycol 17 g packet Commonly known as: MIRALAX / GLYCOLAX   Tylenol 8 Hour Arthritis Pain 650 MG CR tablet Generic drug: acetaminophen   Xarelto 20 MG Tabs tablet Generic drug: rivaroxaban        Allergies  Allergen Reactions   Fluzone Quadrivalent [Influenza Vac Split Quad] Other (See Comments)    Patient does NOT want a flu shot- stated it caused A-Fib   Penicillins Anaphylaxis and Other (See Comments)    Tolerates ceftriaxone   Amiodarone Other (See Comments)    "Multiple side effects"   Codeine Other (See Comments)    GI Intolerance   Coumarin Nausea Only   Fosamax [Alendronate] Other (See Comments)    Reaction not noted- listed as allergic on [facility] paperwork   Hydrocodone-Acetaminophen Other (See Comments)    GI Intolerance   Lotensin [Benazepril] Cough   Metformin And Related Diarrhea and Nausea Only   Norvasc [Amlodipine]  Other (See Comments)    Headache     Consultations: Palliative care   Procedures/Studies: DG CHEST PORT 1 VIEW  Result Date: 04/18/2022 CLINICAL DATA:  New cough EXAM: PORTABLE CHEST 1 VIEW COMPARISON:  Chest radiograph 04/15/2022 FINDINGS: No pleural effusion. No pneumothorax. No new focal airspace opacity. There is slightly improved aeration of the left lung base. Cardiac and mediastinal contours are otherwise unchanged compared to  prior exam with persistent rightward deviation of the trachea. Visualized upper abdomen is unremarkable with unchanged slightly prominent loops of bowel in the left upper quadrant. No acute osseous abnormality. IMPRESSION: No new focal airspace opacity. Slightly improved aeration of the left lung base compared to prior exam. Electronically Signed   By: Lorenza Cambridge M.D.   On: 04/18/2022 09:34   DG CHEST PORT 1 VIEW  Result Date: 04/15/2022 CLINICAL DATA:  N[age] years inpatient with hypoxia. EXAM: PORTABLE CHEST 1 VIEW COMPARISON:  Chest radiograph dated November 10, 2019 FINDINGS: The heart is mildly enlarged. Left basilar opacity suggesting atelectasis or infiltrate. Right lung is clear. Thoracic spondylosis and bilateral glenohumeral and acromioclavicular joint arthritic changes. IMPRESSION: Left basilar opacity suggesting atelectasis or infiltrate. Mild cardiomegaly. No evidence of pulmonary edema or large pleural effusion. Electronically Signed   By: Larose Hires D.O.   On: 04/15/2022 12:51   VAS [place] LOWER EXTREMITY VENOUS (DVT) (7a-7p)  Result Date: 04/13/2022  Lower Venous DVT Study Patient Name:  Jasmine Jenkins  Date of Exam:   04/13/2022 Medical Rec #: [id number]        Accession #:    [id number] Date of Birth: 26-Jul-1926        Patient Gender: F Patient Age:   80 years Exam Location:  [facility] Procedure:      VAS [place] LOWER EXTREMITY VENOUS (DVT) Referring Phys: Riki Sheer --------------------------------------------------------------------------------  Indications: Erythema, Swelling, and Pain. Other Indications: Patient being treated for cellulitis. Limitations: Poor ultrasound/tissue interface and swelling and skin changes. Comparison Study: No prior studies. Performing Technologist: Jean Rosenthal RDMS, RVT  Examination Guidelines: A complete evaluation includes B-mode imaging, spectral Doppler, color Doppler, and power Doppler as needed of all accessible portions of each vessel.  Bilateral testing is considered an integral part of a complete examination. Limited examinations for reoccurring indications may be performed as noted. The reflux portion of the exam is performed with the patient in reverse Trendelenburg.  +---------+---------------+---------+-----------+----------+---------------+ RIGHT    CompressibilityPhasicitySpontaneityPropertiesThrombus Aging  +---------+---------------+---------+-----------+----------+---------------+ CFV      Full           Yes      Yes                                  +---------+---------------+---------+-----------+----------+---------------+ SFJ      Full                                                         +---------+---------------+---------+-----------+----------+---------------+ FV Prox  Full                                                         +---------+---------------+---------+-----------+----------+---------------+  FV Mid   Full                                                         +---------+---------------+---------+-----------+----------+---------------+ FV DistalFull                                                         +---------+---------------+---------+-----------+----------+---------------+ PFV      Full                                                         +---------+---------------+---------+-----------+----------+---------------+ POP      Full           Yes      Yes                                  +---------+---------------+---------+-----------+----------+---------------+ PTV                     Yes      Yes                  Patent by color +---------+---------------+---------+-----------+----------+---------------+ PERO                    Yes      Yes                  Patent by color +---------+---------------+---------+-----------+----------+---------------+   +----+---------------+---------+-----------+----------+--------------+  LEFTCompressibilityPhasicitySpontaneityPropertiesThrombus Aging +----+---------------+---------+-----------+----------+--------------+ CFV Full           Yes      Yes                                 +----+---------------+---------+-----------+----------+--------------+     Summary: RIGHT: - There is no evidence of deep vein thrombosis in the lower extremity. However, portions of this examination were limited- see technologist comments above.  - No cystic structure found in the popliteal fossa.  LEFT: - No evidence of common femoral vein obstruction.  *See table(s) above for measurements and observations. Electronically signed by Coral Else MD on 04/13/2022 at 11:54:55 PM.    Final    CT TIBIA FIBULA RIGHT WO CONTRAST  Result Date: 04/13/2022 CLINICAL DATA:  Soft tissue infection suspected, lower leg, xray done EXAM: CT OF THE LOWER RIGHT EXTREMITY WITHOUT CONTRAST TECHNIQUE: Multidetector CT imaging of the right lower extremity was performed according to the standard protocol. RADIATION DOSE REDUCTION: This exam was performed according to the departmental dose-optimization program which includes automated exposure control, adjustment of the mA and/or kV according to patient size and/or use of iterative reconstruction technique. COMPARISON:  None Available. FINDINGS: Bones/Joint/Cartilage There is no evidence of acute fracture. There is no bony erosion or frank bony destruction. Diffuse osteopenia. Mild-to-moderate osteoarthritis in the midfoot and forefoot. Moderate tibiotalar osteoarthritis. There is severe tricompartment osteoarthritis of the right knee with chondrocalcinosis.  Ligaments Suboptimally assessed by CT. Muscles and Tendons There is no intramuscular collection. There is atrophy in the anterior and posterior compartments of the lower leg. Soft tissues There is diffuse skin thickening and subcutaneous soft tissue swelling of the right lower leg and foot with blister like skin lesions  along the lower leg. This is asymmetric in comparison to the left lower extremity, which is partially visualized. There is no well-defined/drainable collection (other than the blister-like skin lesions), and noncontrast CT. There is no evidence of soft tissue gas. IMPRESSION: Diffuse skin thickening and subcutaneous soft tissue swelling of the right lower leg and foot, as can be seen in cellulitis or lymphedema. Blister-like skin lesions of the distal lower leg. This is asymmetric in comparison to the partially visualized left lower leg. There is no well-defined/drainable fluid collection on noncontrast CT. No acute osseous abnormality. No soft tissue gas. Electronically Signed   By: Caprice RenshawJacob  Kahn M.D.   On: 04/13/2022 20:25   CT FOOT RIGHT WO CONTRAST  Result Date: 04/13/2022 CLINICAL DATA:  Soft tissue infection suspected, lower leg, xray done EXAM: CT OF THE LOWER RIGHT EXTREMITY WITHOUT CONTRAST TECHNIQUE: Multidetector CT imaging of the right lower extremity was performed according to the standard protocol. RADIATION DOSE REDUCTION: This exam was performed according to the departmental dose-optimization program which includes automated exposure control, adjustment of the mA and/or kV according to patient size and/or use of iterative reconstruction technique. COMPARISON:  None Available. FINDINGS: Bones/Joint/Cartilage There is no evidence of acute fracture. There is no bony erosion or frank bony destruction. Diffuse osteopenia. Mild-to-moderate osteoarthritis in the midfoot and forefoot. Moderate tibiotalar osteoarthritis. There is severe tricompartment osteoarthritis of the right knee with chondrocalcinosis. Ligaments Suboptimally assessed by CT. Muscles and Tendons There is no intramuscular collection. There is atrophy in the anterior and posterior compartments of the lower leg. Soft tissues There is diffuse skin thickening and subcutaneous soft tissue swelling of the right lower leg and foot with blister  like skin lesions along the lower leg. This is asymmetric in comparison to the left lower extremity, which is partially visualized. There is no well-defined/drainable collection (other than the blister-like skin lesions), and noncontrast CT. There is no evidence of soft tissue gas. IMPRESSION: Diffuse skin thickening and subcutaneous soft tissue swelling of the right lower leg and foot, as can be seen in cellulitis or lymphedema. Blister-like skin lesions of the distal lower leg. This is asymmetric in comparison to the partially visualized left lower leg. There is no well-defined/drainable fluid collection on noncontrast CT. No acute osseous abnormality. No soft tissue gas. Electronically Signed   By: Caprice RenshawJacob  Kahn M.D.   On: 04/13/2022 20:25      Subjective: Patient seen and examined the bedside today.  She looks comfortable, on full comfort care.  Not in any kind of distress.  Son at bedside  Discharge Exam: Vitals:   04/19/22 0358 04/20/22 0529  BP: 114/64 121/64  Pulse: 92 98  Resp: 20 20  Temp: 97.8 F (36.6 C) 97.6 F (36.4 C)  SpO2: 96%    Vitals:   04/18/22 0522 04/18/22 1133 04/19/22 0358 04/20/22 0529  BP: 123/62 (!) 107/53 114/64 121/64  Pulse: 76 80 92 98  Resp: 18 15 20 20   Temp: 98.2 F (36.8 C) 98 F (36.7 C) 97.8 F (36.6 C) 97.6 F (36.4 C)  TempSrc: Oral Axillary Oral Oral  SpO2: 98% 100% 96%   Weight:      Height:  General: Pt is sleeping,appears comfortable Extremities: no edema, no cyanosis    The results of significant diagnostics from this hospitalization (including imaging, microbiology, ancillary and laboratory) are listed below for reference.     Microbiology: Recent Results (from the past 240 hour(s))  Culture, blood (routine x 2)     Status: None   Collection Time: 04/13/22  6:59 PM   Specimen: BLOOD  Result Value Ref Range Status   Specimen Description   Final    BLOOD RIGHT ANTECUBITAL Performed at [facility],  [address] [place], [place] [address]    Special Requests   Final    BOTTLES DRAWN AEROBIC AND ANAEROBIC Blood Culture results may not be optimal due to an inadequate volume of blood received in culture bottles Performed at [facility], [address], [place], [place] [address]    Culture   Final    NO GROWTH 5 DAYS Performed at [facility], [address] [place], [place] [address]    Report Status 04/18/2022 FINAL  Final  Culture, blood (routine x 2)     Status: None   Collection Time: 04/13/22  6:59 PM   Specimen: BLOOD  Result Value Ref Range Status   Specimen Description   Final    BLOOD LEFT ANTECUBITAL Performed at [facility], [address] [place], [place] [address]    Special Requests   Final    BOTTLES DRAWN AEROBIC ONLY Blood Culture results may not be optimal due to an inadequate volume of blood received in culture bottles Performed at [facility], [address], [place], [place] [address]    Culture   Final    NO GROWTH 5 DAYS Performed at [facility], [address], [place], [place] [address]    Report Status 04/18/2022 FINAL  Final     Labs: BNP (last 3 results) No results for input(s): "BNP" in the last 8760 hours. Basic Metabolic Panel: Recent Labs  Lab 04/13/22 1728 04/14/22 0751 04/15/22 0546 04/16/22 0520 04/17/22 0432 04/18/22 0451  NA 130* 133* 135  --  133* 135  K 3.7 3.1* 3.7  --  2.9* 2.9*  CL 92* 99 101  --  96* 95*  CO2 21* 25 27  --  28 32  GLUCOSE 254* 89 189*  --  106* 98  BUN 81* 78* 51*  --  18 17  CREATININE 1.81* 1.38* 0.93 0.60 0.47 0.56  CALCIUM 9.9 9.0 9.3  --  8.8* 8.8*  MG  --   --  2.1  --  1.9 1.6*  PHOS  --   --  2.3*  --  1.5* 2.2*   Liver Function Tests: Recent Labs  Lab 04/14/22 0751  AST 15  ALT 15  ALKPHOS 45  BILITOT 1.1  PROT 5.5*  ALBUMIN 2.4*   No results for input(s): "LIPASE", "AMYLASE" in the last 168 hours. No  results for input(s): "AMMONIA" in the last 168 hours. CBC: Recent Labs  Lab 04/13/22 1728 04/14/22 0751 04/15/22 0546 04/16/22 0520 04/17/22 0432 04/18/22 0451  WBC 20.8* 18.7* 16.9* 16.1* 19.6* 23.0*  NEUTROABS 19.1*  --   --   --   --   --   HGB 13.5 11.2* 12.0 12.4 11.5* 11.4*  HCT 42.5 35.3* 36.1 38.8 37.1 35.7*  MCV 92.8 92.4 90.0 91.9 94.4 90.4  PLT 186 171 166 186 162 209   Cardiac Enzymes: No results  for input(s): "CKTOTAL", "CKMB", "CKMBINDEX", "TROPONINI" in the last 168 hours. BNP: Invalid input(s): "POCBNP" CBG: Recent Labs  Lab 04/17/22 1152 04/17/22 1700 04/17/22 2119 04/18/22 0731 04/18/22 1129  GLUCAP 261* 313* 240* 76 158*   D-Dimer No results for input(s): "DDIMER" in the last 72 hours. Hgb A1c No results for input(s): "HGBA1C" in the last 72 hours. Lipid Profile No results for input(s): "CHOL", "HDL", "LDLCALC", "TRIG", "CHOLHDL", "LDLDIRECT" in the last 72 hours. Thyroid function studies No results for input(s): "TSH", "T4TOTAL", "T3FREE", "THYROIDAB" in the last 72 hours.  Invalid input(s): "FREET3" Anemia work up No results for input(s): "VITAMINB12", "FOLATE", "FERRITIN", "TIBC", "IRON", "RETICCTPCT" in the last 72 hours. Urinalysis    Component Value Date/Time   COLORURINE YELLOW 04/18/2022 1349   APPEARANCEUR HAZY (A) 04/18/2022 1349   LABSPEC 1.021 04/18/2022 1349   PHURINE 5.0 04/18/2022 1349   GLUCOSEU >=500 (A) 04/18/2022 1349   HGBUR NEGATIVE 04/18/2022 1349   BILIRUBINUR NEGATIVE 04/18/2022 1349   KETONESUR NEGATIVE 04/18/2022 1349   PROTEINUR 100 (A) 04/18/2022 1349   NITRITE NEGATIVE 04/18/2022 1349   LEUKOCYTESUR NEGATIVE 04/18/2022 1349   Sepsis Labs Recent Labs  Lab 04/15/22 0546 04/16/22 0520 04/17/22 0432 04/18/22 0451  WBC 16.9* 16.1* 19.6* 23.0*   Microbiology Recent Results (from the past 240 hour(s))  Culture, blood (routine x 2)     Status: None   Collection Time: 04/13/22  6:59 PM   Specimen: BLOOD   Result Value Ref Range Status   Specimen Description   Final    BLOOD RIGHT ANTECUBITAL Performed at [facility], [place] [address], [place], [place] [address]    Special Requests   Final    BOTTLES DRAWN AEROBIC AND ANAEROBIC Blood Culture results may not be optimal due to an inadequate volume of blood received in culture bottles Performed at [place] [address], [place], [place] [address]    Culture   Final    NO GROWTH 5 DAYS Performed at [facility], [place] [address], [place], [place] [address]    Report Status 04/18/2022 FINAL  Final  Culture, blood (routine x 2)     Status: None   Collection Time: 04/13/22  6:59 PM   Specimen: BLOOD  Result Value Ref Range Status   Specimen Description   Final    BLOOD LEFT ANTECUBITAL Performed at [place] [address] [place], [place] [address]    Special Requests   Final    BOTTLES DRAWN AEROBIC ONLY Blood Culture results may not be optimal due to an inadequate volume of blood received in culture bottles Performed at [place] [address] [place], [place] [address]    Culture   Final    NO GROWTH 5 DAYS Performed at [facility], [place] [address], [place], [place] [address]    Report Status 04/18/2022 FINAL  Final    Please note: You were cared for by a hospitalist during your hospital stay. Once you are discharged, your primary care physician will handle any further medical issues. Please note that NO REFILLS for any discharge medications will be authorized once you are discharged, as it is imperative that you return to your primary care physician (or establish a relationship with a primary care physician if you do not have one) for your post hospital discharge needs so that they can reassess your need for medications and monitor your lab values.    Time  coordinating discharge: 40 minutes  SIGNED:   Burnadette Pop,  MD  [facility] 04/20/2022, 9:59 AM Pager [phone]  If 7PM-7AM, please contact night-coverage [email or web address] Password [email or web address]

## 2022-04-20 NOTE — Care Management Important Message (Signed)
Important Message  Patient Details Palliative care  Name: Jasmine Jenkins MRN: [id number] Date of Birth: 07-Dec-1925   Medicare Important Message Given:  No     Kerin Salen 04/20/2022, 12:40 PM

## 2022-04-20 NOTE — Progress Notes (Signed)
Pt d/c'd to [facility] in stable condition. Report called to receiving nurse.

## 2022-05-16 ENCOUNTER — Other Ambulatory Visit: Payer: Self-pay | Admitting: *Deleted

## 2022-05-16 DIAGNOSIS — I739 Peripheral vascular disease, unspecified: Secondary | ICD-10-CM

## 2022-05-31 ENCOUNTER — Encounter (HOSPITAL_COMMUNITY): Payer: Medicare Other

## 2022-05-31 ENCOUNTER — Ambulatory Visit: Payer: Medicare Other

## 2022-05-31 DEATH — deceased
